# Patient Record
Sex: Female | Born: 1985 | Race: Black or African American | Hispanic: No | Marital: Single | Smoking: Former smoker
Health system: Southern US, Community
[De-identification: ages and names within clinical notes are randomized; demographics above are authoritative.]

## PROBLEM LIST (undated history)

## (undated) ENCOUNTER — Inpatient Hospital Stay (HOSPITAL_COMMUNITY): Payer: Self-pay

## (undated) DIAGNOSIS — F32A Depression, unspecified: Secondary | ICD-10-CM

## (undated) DIAGNOSIS — I429 Cardiomyopathy, unspecified: Secondary | ICD-10-CM

## (undated) DIAGNOSIS — B999 Unspecified infectious disease: Secondary | ICD-10-CM

## (undated) DIAGNOSIS — Z8669 Personal history of other diseases of the nervous system and sense organs: Secondary | ICD-10-CM

## (undated) DIAGNOSIS — R51 Headache: Secondary | ICD-10-CM

## (undated) DIAGNOSIS — G479 Sleep disorder, unspecified: Secondary | ICD-10-CM

## (undated) DIAGNOSIS — R600 Localized edema: Secondary | ICD-10-CM

## (undated) DIAGNOSIS — Z8619 Personal history of other infectious and parasitic diseases: Secondary | ICD-10-CM

## (undated) DIAGNOSIS — R079 Chest pain, unspecified: Secondary | ICD-10-CM

## (undated) DIAGNOSIS — R002 Palpitations: Secondary | ICD-10-CM

## (undated) DIAGNOSIS — R0602 Shortness of breath: Secondary | ICD-10-CM

## (undated) DIAGNOSIS — F319 Bipolar disorder, unspecified: Secondary | ICD-10-CM

## (undated) DIAGNOSIS — T7840XA Allergy, unspecified, initial encounter: Secondary | ICD-10-CM

## (undated) DIAGNOSIS — M549 Dorsalgia, unspecified: Secondary | ICD-10-CM

## (undated) DIAGNOSIS — E785 Hyperlipidemia, unspecified: Secondary | ICD-10-CM

## (undated) DIAGNOSIS — R5383 Other fatigue: Secondary | ICD-10-CM

## (undated) DIAGNOSIS — G4730 Sleep apnea, unspecified: Secondary | ICD-10-CM

## (undated) DIAGNOSIS — I10 Essential (primary) hypertension: Secondary | ICD-10-CM

## (undated) HISTORY — DX: Sleep apnea, unspecified: G47.30

## (undated) HISTORY — DX: Personal history of other diseases of the nervous system and sense organs: Z86.69

## (undated) HISTORY — DX: Dorsalgia, unspecified: M54.9

## (undated) HISTORY — DX: Allergy, unspecified, initial encounter: T78.40XA

## (undated) HISTORY — DX: Palpitations: R00.2

## (undated) HISTORY — PX: ADENOIDECTOMY: SUR15

## (undated) HISTORY — DX: Hyperlipidemia, unspecified: E78.5

## (undated) HISTORY — DX: Chest pain, unspecified: R07.9

## (undated) HISTORY — DX: Personal history of other infectious and parasitic diseases: Z86.19

## (undated) HISTORY — DX: Other fatigue: R53.83

## (undated) HISTORY — DX: Cardiomyopathy, unspecified: I42.9

## (undated) HISTORY — DX: Localized edema: R60.0

## (undated) HISTORY — DX: Bipolar disorder, unspecified: F31.9

## (undated) HISTORY — DX: Sleep disorder, unspecified: G47.9

## (undated) HISTORY — PX: DILATION AND CURETTAGE OF UTERUS: SHX78

## (undated) HISTORY — DX: Shortness of breath: R06.02

## (undated) HISTORY — DX: Depression, unspecified: F32.A

---
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
## 2009-03-18 ENCOUNTER — Inpatient Hospital Stay (HOSPITAL_COMMUNITY): Admission: AD | Admit: 2009-03-18 | Discharge: 2009-03-18 | Payer: Self-pay | Admitting: Obstetrics & Gynecology

## 2009-06-19 ENCOUNTER — Emergency Department (HOSPITAL_COMMUNITY): Admission: EM | Admit: 2009-06-19 | Discharge: 2009-06-19 | Payer: Self-pay | Admitting: Emergency Medicine

## 2009-06-20 ENCOUNTER — Inpatient Hospital Stay (HOSPITAL_COMMUNITY): Admission: AD | Admit: 2009-06-20 | Discharge: 2009-06-20 | Payer: Self-pay | Admitting: Obstetrics & Gynecology

## 2009-07-27 ENCOUNTER — Emergency Department (HOSPITAL_COMMUNITY): Admission: EM | Admit: 2009-07-27 | Discharge: 2009-07-27 | Payer: Self-pay | Admitting: Emergency Medicine

## 2009-07-28 ENCOUNTER — Emergency Department (HOSPITAL_COMMUNITY): Admission: EM | Admit: 2009-07-28 | Discharge: 2009-07-29 | Payer: Self-pay | Admitting: Emergency Medicine

## 2009-12-19 ENCOUNTER — Inpatient Hospital Stay (HOSPITAL_COMMUNITY): Admission: AD | Admit: 2009-12-19 | Discharge: 2009-12-20 | Payer: Self-pay | Admitting: Obstetrics & Gynecology

## 2009-12-19 ENCOUNTER — Ambulatory Visit: Payer: Self-pay | Admitting: Physician Assistant

## 2010-01-14 DEATH — deceased

## 2010-07-23 ENCOUNTER — Emergency Department (HOSPITAL_COMMUNITY): Admission: EM | Admit: 2010-07-23 | Discharge: 2009-10-09 | Payer: Self-pay | Admitting: Emergency Medicine

## 2010-07-23 ENCOUNTER — Inpatient Hospital Stay (HOSPITAL_COMMUNITY): Admission: AD | Admit: 2010-07-23 | Discharge: 2009-12-18 | Payer: Self-pay | Admitting: Obstetrics and Gynecology

## 2010-09-25 ENCOUNTER — Emergency Department (HOSPITAL_COMMUNITY)
Admission: EM | Admit: 2010-09-25 | Discharge: 2010-09-25 | Disposition: A | Discharge status: Home / Self Care | Payer: 59 | Attending: Emergency Medicine | Admitting: Emergency Medicine

## 2010-09-25 ENCOUNTER — Emergency Department (HOSPITAL_COMMUNITY): Payer: 59

## 2010-09-25 DIAGNOSIS — R0602 Shortness of breath: Secondary | ICD-10-CM | POA: Insufficient documentation

## 2010-09-25 DIAGNOSIS — F17200 Nicotine dependence, unspecified, uncomplicated: Secondary | ICD-10-CM | POA: Insufficient documentation

## 2010-09-25 DIAGNOSIS — R079 Chest pain, unspecified: Secondary | ICD-10-CM | POA: Insufficient documentation

## 2010-11-03 LAB — URINALYSIS, ROUTINE W REFLEX MICROSCOPIC
Glucose, UA: NEGATIVE mg/dL
Leukocytes, UA: NEGATIVE
Nitrite: NEGATIVE
Protein, ur: NEGATIVE mg/dL

## 2010-11-03 LAB — WET PREP, GENITAL: Trich, Wet Prep: NONE SEEN

## 2010-11-03 LAB — CBC
MCV: 95.3 fL (ref 78.0–100.0)
RDW: 12.4 % (ref 11.5–15.5)

## 2010-11-03 LAB — GC/CHLAMYDIA PROBE AMP, GENITAL
Chlamydia, DNA Probe: NEGATIVE
GC Probe Amp, Genital: NEGATIVE

## 2010-11-03 LAB — URINE MICROSCOPIC-ADD ON

## 2010-11-03 LAB — POCT PREGNANCY, URINE: Preg Test, Ur: POSITIVE

## 2010-11-17 LAB — URINALYSIS, ROUTINE W REFLEX MICROSCOPIC
Bilirubin Urine: NEGATIVE
Hgb urine dipstick: NEGATIVE
Nitrite: NEGATIVE
Protein, ur: NEGATIVE mg/dL
Urobilinogen, UA: 1 mg/dL (ref 0.0–1.0)
pH: 6 (ref 5.0–8.0)

## 2010-11-17 LAB — WET PREP, GENITAL: Yeast Wet Prep HPF POC: NONE SEEN

## 2010-11-17 LAB — COMPREHENSIVE METABOLIC PANEL
Albumin: 3.8 g/dL (ref 3.5–5.2)
Alkaline Phosphatase: 50 U/L (ref 39–117)
BUN: 10 mg/dL (ref 6–23)
Calcium: 9.5 mg/dL (ref 8.4–10.5)
Creatinine, Ser: 0.87 mg/dL (ref 0.4–1.2)
GFR calc Af Amer: >60 mL/min (ref >60)
Glucose, Bld: 107 mg/dL — ABNORMAL HIGH (ref 70–99)
Total Protein: 7.8 g/dL (ref 6.0–8.3)

## 2010-11-17 LAB — POCT PREGNANCY, URINE: Preg Test, Ur: NEGATIVE

## 2010-11-17 LAB — CBC
HCT: 39 % (ref 36.0–46.0)
MCHC: 33.9 g/dL (ref 30.0–36.0)
Platelets: 166 10*3/uL (ref 150–400)
RBC: 4.14 MIL/uL (ref 3.87–5.11)
RDW: 12.1 % (ref 11.5–15.5)
WBC: 6.7 10*3/uL (ref 4.0–10.5)

## 2010-11-17 LAB — DIFFERENTIAL
Eosinophils Relative: 0 % (ref 0–5)
Neutro Abs: 5.5 10*3/uL (ref 1.7–7.7)
Neutrophils Relative %: 82 % — ABNORMAL HIGH (ref 43–77)

## 2010-11-17 LAB — URINE MICROSCOPIC-ADD ON

## 2010-11-18 LAB — URINALYSIS, ROUTINE W REFLEX MICROSCOPIC
Bilirubin Urine: NEGATIVE
Glucose, UA: NEGATIVE mg/dL
Ketones, ur: NEGATIVE mg/dL
Nitrite: NEGATIVE
Protein, ur: NEGATIVE mg/dL
Specific Gravity, Urine: 1.02 (ref 1.005–1.030)
Urobilinogen, UA: 0.2 mg/dL (ref 0.0–1.0)
pH: 7 (ref 5.0–8.0)

## 2010-11-18 LAB — RAPID STREP SCREEN (MED CTR MEBANE ONLY): Streptococcus, Group A Screen (Direct): NEGATIVE

## 2010-11-18 LAB — POCT PREGNANCY, URINE: Preg Test, Ur: NEGATIVE

## 2010-11-21 LAB — URINALYSIS, ROUTINE W REFLEX MICROSCOPIC
Bilirubin Urine: NEGATIVE
Glucose, UA: NEGATIVE mg/dL
Ketones, ur: NEGATIVE mg/dL
Protein, ur: NEGATIVE mg/dL

## 2010-11-21 LAB — WET PREP, GENITAL
Trich, Wet Prep: NONE SEEN
Yeast Wet Prep HPF POC: NONE SEEN

## 2010-11-21 LAB — GC/CHLAMYDIA PROBE AMP, GENITAL: Chlamydia, DNA Probe: POSITIVE — AB

## 2010-11-21 LAB — CBC
HCT: 36.6 % (ref 36.0–46.0)
Platelets: 157 10*3/uL (ref 150–400)
RDW: 12.3 % (ref 11.5–15.5)
WBC: 4.9 10*3/uL (ref 4.0–10.5)

## 2011-03-19 DIAGNOSIS — N970 Female infertility associated with anovulation: Secondary | ICD-10-CM | POA: Insufficient documentation

## 2012-02-14 LAB — HM PAP SMEAR: HM Pap smear: NORMAL

## 2012-07-04 ENCOUNTER — Encounter (HOSPITAL_COMMUNITY): Payer: Self-pay | Admitting: Emergency Medicine

## 2012-07-04 ENCOUNTER — Emergency Department (HOSPITAL_COMMUNITY)
Admission: EM | Admit: 2012-07-04 | Discharge: 2012-07-05 | Disposition: A | Discharge status: Home / Self Care | Payer: 59 | Attending: Emergency Medicine | Admitting: Emergency Medicine

## 2012-07-04 DIAGNOSIS — L02419 Cutaneous abscess of limb, unspecified: Secondary | ICD-10-CM | POA: Insufficient documentation

## 2012-07-04 DIAGNOSIS — W57XXXA Bitten or stung by nonvenomous insect and other nonvenomous arthropods, initial encounter: Secondary | ICD-10-CM | POA: Insufficient documentation

## 2012-07-04 DIAGNOSIS — S90569A Insect bite (nonvenomous), unspecified ankle, initial encounter: Secondary | ICD-10-CM | POA: Insufficient documentation

## 2012-07-04 DIAGNOSIS — Y929 Unspecified place or not applicable: Secondary | ICD-10-CM | POA: Insufficient documentation

## 2012-07-04 DIAGNOSIS — Y939 Activity, unspecified: Secondary | ICD-10-CM | POA: Insufficient documentation

## 2012-07-04 DIAGNOSIS — R21 Rash and other nonspecific skin eruption: Secondary | ICD-10-CM | POA: Insufficient documentation

## 2012-07-04 DIAGNOSIS — L0390 Cellulitis, unspecified: Secondary | ICD-10-CM

## 2012-07-04 NOTE — ED Notes (Signed)
Pt reports bite to R lateral thigh around sun/mon; reports red and swollen now; denies fevers

## 2012-07-05 MED ORDER — CEPHALEXIN 500 MG PO CAPS: 500.0000 mg | ORAL_CAPSULE | Freq: Four times a day (QID) | ORAL | Status: DC

## 2012-07-05 NOTE — ED Notes (Signed)
Family at bedside. 

## 2012-07-05 NOTE — ED Notes (Signed)
MD at bedside. 

## 2012-07-05 NOTE — ED Notes (Signed)
I gave the patient a pair of tan large socks. 

## 2012-07-05 NOTE — ED Notes (Signed)
Patient says she does not what bit her, but she has a bite on her right, thigh bellow right buttocks.  She also has another bite behind the left calf.  Patient says the one on the left calf happened first, and the one on the right thigh is worse.

## 2012-07-05 NOTE — ED Provider Notes (Signed)
History     CSN: [id number]  Arrival date & time 07/04/12  2346   First MD Initiated Contact with Patient 07/04/12 2351      Chief Complaint  Patient presents with  . Insect Bite   HPI  History provided by the patient. Patient is a 26 year old female with no significant PMH who presents with concerns for insect bites to lateral right thigh left ankle and calf. Patient first noticed a small little swollen areas of the skin with itching 3-5 days ago. Patient does admit to scratching and itching to the areas regularly. She has used hydrocortisone creams over the area but states that since that time the areas have become swollen and firm increased redness. Patient denies having any erythematous streaks up the leg. Areas continued to have some itching. She denies any significant pain or tenderness. She denies any nodules or fluctuance. She denies having similar symptoms previously. Patient has no other complaints. Denies any fever, chills or sweats.    History reviewed. No pertinent past medical history.  Past Surgical History  Procedure Date  . Adenoidectomy     History reviewed. No pertinent family history.  History  Substance Use Topics  . Smoking status: Never Smoker   . Smokeless tobacco: Not on file  . Alcohol Use: No    OB History    Grav Para Term Preterm Abortions TAB SAB Ect Mult Living                  Review of Systems  Constitutional: Negative for fever, chills and diaphoresis.  Respiratory: Negative for cough and shortness of breath.   Cardiovascular: Negative for chest pain.  Gastrointestinal: Negative for abdominal pain.  Skin: Positive for rash.    Allergies  Review of patient's allergies indicates no known allergies.  Home Medications   Current Outpatient Rx  Name  Route  Sig  Dispense  Refill  . HYDROCORTISONE 1 % EX CREA   Topical   Apply 1 application topically as needed. itching           BP 165/94  Pulse 85  Temp 98.6 F (37 C)  (Oral)  Resp 18  SpO2 99%  LMP 06/13/2012  Physical Exam  Nursing note and vitals reviewed. Constitutional: She is oriented to person, place, and time. She appears well-developed and well-nourished. No distress.  HENT:  Head: Normocephalic.  Cardiovascular: Normal rate and regular rhythm.   No murmur heard. Pulmonary/Chest: Effort normal and breath sounds normal. No respiratory distress. She has no wheezes. She has no rales.  Neurological: She is alert and oriented to person, place, and time.  Skin: Skin is warm and dry.       There is a 12-13 cm circular area of erythema and induration to the lateral right thigh. There is no significant tenderness. No nodularity or fluctuance. No bleeding or drainage. There is significant increased warmth of the skin over the area. No erythematous streaks.  Similar 4 cm circular area to the posterior left lower leg with erythema and induration. No bleeding or drainage. No erythematous streaks.  Psychiatric: She has a normal mood and affect. Her behavior is normal.    ED Course  Procedures      1. Cellulitis       MDM  12:35 AM patient seen and evaluated. Patient appears well in no acute distress. Patient nontoxic appearing.        Angus Seller, [place] 07/05/12 [phone]

## 2012-07-05 NOTE — ED Notes (Signed)
Patient is alert and orientedx4.  Patient was explained discharge instructions and she did not have any questions. 

## 2012-07-05 NOTE — ED Provider Notes (Signed)
Medical screening examination/treatment/procedure(s) were performed by non-physician practitioner and as supervising physician I was immediately available for consultation/collaboration.  Tenicia Gural K Jonanthan Bolender, MD 07/05/12 0405 

## 2012-08-08 ENCOUNTER — Other Ambulatory Visit (INDEPENDENT_AMBULATORY_CARE_PROVIDER_SITE_OTHER): Payer: 59

## 2012-08-08 ENCOUNTER — Telehealth: Payer: Self-pay | Admitting: *Deleted

## 2012-08-08 ENCOUNTER — Encounter: Payer: Self-pay | Admitting: Internal Medicine

## 2012-08-08 ENCOUNTER — Encounter: Payer: Self-pay | Admitting: *Deleted

## 2012-08-08 ENCOUNTER — Ambulatory Visit (INDEPENDENT_AMBULATORY_CARE_PROVIDER_SITE_OTHER): Payer: 59 | Admitting: Internal Medicine

## 2012-08-08 VITALS — BP 112/72 | HR 79 | Temp 97.9°F | Ht 65.0 in | Wt 243.2 lb

## 2012-08-08 DIAGNOSIS — Z131 Encounter for screening for diabetes mellitus: Secondary | ICD-10-CM

## 2012-08-08 DIAGNOSIS — Z130 Encounter for screening for diseases of the blood and blood-forming organs and certain disorders involving the immune mechanism: Secondary | ICD-10-CM

## 2012-08-08 DIAGNOSIS — Z0000 Encounter for general adult medical examination without abnormal findings: Secondary | ICD-10-CM

## 2012-08-08 DIAGNOSIS — G43909 Migraine, unspecified, not intractable, without status migrainosus: Secondary | ICD-10-CM

## 2012-08-08 DIAGNOSIS — Z1329 Encounter for screening for other suspected endocrine disorder: Secondary | ICD-10-CM

## 2012-08-08 DIAGNOSIS — Z13220 Encounter for screening for lipoid disorders: Secondary | ICD-10-CM

## 2012-08-08 LAB — LIPID PANEL
HDL: 34.9 mg/dL — ABNORMAL LOW (ref >39.00)
LDL Cholesterol: 82 mg/dL (ref 0–99)
VLDL: 39.6 mg/dL (ref 0.0–40.0)

## 2012-08-08 LAB — CBC
Platelets: 196 10*3/uL (ref 150.0–400.0)
RBC: 3.99 Mil/uL (ref 3.87–5.11)
WBC: 5.1 10*3/uL (ref 4.5–10.5)

## 2012-08-08 LAB — BASIC METABOLIC PANEL
BUN: 7 mg/dL (ref 6–23)
CO2: 25 mEq/L (ref 19–32)
Chloride: 105 mEq/L (ref 96–112)
GFR: 108.63 mL/min (ref >60.00)
Glucose, Bld: 98 mg/dL (ref 70–99)
Potassium: 4.5 mEq/L (ref 3.5–5.1)
Sodium: 138 mEq/L (ref 135–145)

## 2012-08-08 LAB — HEMOGLOBIN A1C: Hgb A1c MFr Bld: 5.1 % (ref 4.6–6.5)

## 2012-08-08 LAB — TSH: TSH: 0.77 u[IU]/mL (ref 0.35–5.50)

## 2012-08-08 MED ORDER — TOPIRAMATE 100 MG PO TABS: 100.0000 mg | ORAL_TABLET | Freq: Two times a day (BID) | ORAL | Status: DC

## 2012-08-08 MED ORDER — BUTALBITAL-APAP-CAFFEINE 50-325-40 MG PO TABS: 1.0000 | ORAL_TABLET | Freq: Four times a day (QID) | ORAL | Status: DC | PRN

## 2012-08-08 NOTE — Progress Notes (Signed)
HPI  Pt presents to the clinic today to establish care. She has not seen a PCP in over a year. She does have concerns about her blood pressure. Everyone in her family has HTN and she is concerned that she may have it also. She also has concerns about her migraines. She has a history of migraines since being in the 4th grade. She did use to be on Topamax but ran out and has not had it refilled. She is getting migraines twice per month. She does have associated nausea and vomiting, but no aura.  Past Medical History  Diagnosis Date  . Hx of migraines   . Hyperlipidemia   . Allergy   . History of chicken pox     No current outpatient prescriptions on file.    No Known Allergies  Family History  Problem Relation Age of Onset  . Hypertension Mother   . Hypertension Father   . Hypertension Maternal Grandmother   . Hypertension Maternal Grandfather   . Hypertension Paternal Grandmother   . Hypertension Paternal Grandfather     History   Social History  . Marital Status: Single    Spouse Name: N/A    Number of Children: 0  . Years of Education: 12+   Occupational History  .  [employer]   Social History Main Topics  . Smoking status: Current Every Day Smoker  . Smokeless tobacco: Never Used  . Alcohol Use: 0.6 oz/week    1 Cans of beer per week  . Drug Use: No  . Sexually Active: Not on file   Other Topics Concern  . Not on file   Social History Narrative   Regular exercise-noCaffeine Use-yes    ROS:  Constitutional: Pt reports headache. Denies fever, malaise, fatigue or abrupt weight changes.  HEENT: Denies eye pain, eye redness, ear pain, ringing in the ears, wax buildup, runny nose, nasal congestion, bloody nose, or sore throat. Respiratory: Denies difficulty breathing, shortness of breath, cough or sputum production.   Cardiovascular: Denies chest pain, chest tightness, palpitations or swelling in the hands or feet.  Gastrointestinal: Denies abdominal  pain, bloating, constipation, diarrhea or blood in the stool.  GU: Denies frequency, urgency, pain with urination, blood in urine, odor or discharge. Musculoskeletal: Denies decrease in range of motion, difficulty with gait, muscle pain or joint pain and swelling.  Skin: Denies redness, rashes, lesions or ulcercations.  Neurological: Denies dizziness, difficulty with memory, difficulty with speech or problems with balance and coordination.   No other specific complaints in a complete review of systems (except as listed in HPI above).  PE:  BP 112/72  Pulse 79  Temp 97.9 F (36.6 C) (Oral)  Ht 5\' 5"  (1.651 m)  Wt 243 lb 4 oz (110.337 kg)  BMI 40.48 kg/m2  SpO2 98%  LMP 08/05/2012 Wt Readings from Last 3 Encounters:  08/08/12 243 lb 4 oz (110.337 kg)    General: Appears her stated age,obese but well developed, well nourished in NAD. HEENT: Head: normal shape and size; Eyes: sclera white, no icterus, conjunctiva pink, PERRLA and EOMs intact; Ears: Tm's gray and intact, normal light reflex; Nose: mucosa pink and moist, septum midline; Throat/Mouth: Teeth present, mucosa pink and moist, no lesions or ulcerations noted.  Neck: Normal range of motion. Neck supple, trachea midline. No massses, lumps or thyromegaly present.  Cardiovascular: Normal rate and rhythm. S1,S2 noted.  No murmur, rubs or gallops noted. No JVD or BLE edema. No carotid bruits noted. Pulmonary/Chest: Normal  effort and positive vesicular breath sounds. No respiratory distress. No wheezes, rales or ronchi noted.  Abdomen: Soft and nontender. Normal bowel sounds, no bruits noted. No distention or masses noted. Liver, spleen and kidneys non palpable. Musculoskeletal: Normal range of motion. No signs of joint swelling. No difficulty with gait.  Neurological: Alert and oriented. Cranial nerves II-XII intact. Coordination normal. +DTRs bilaterally. Psychiatric: Mood and affect normal. Behavior is normal. Judgment and thought  content normal.    Assessment and Plan:  Preventative Health Maintenance:  Start a diet and exercise routine Avoid salt in your diet All HM UTD Will obtain basic screening labs today  Migraines, without aura  Refill Topamax Fiorecet for breakthrough  RTC in 1 year or sooner if needed

## 2012-08-08 NOTE — Patient Instructions (Signed)
Health Maintenance, Females A healthy lifestyle and preventative care can promote health and wellness.  Maintain regular health, dental, and eye exams.   Eat a healthy diet. Foods like vegetables, fruits, whole grains, low-fat dairy products, and lean protein foods contain the nutrients you need without too many calories. Decrease your intake of foods high in solid fats, added sugars, and salt. Get information about a proper diet from your caregiver, if necessary.   Regular physical exercise is one of the most important things you can do for your health. Most adults should get at least 150 minutes of moderate-intensity exercise (any activity that increases your heart rate and causes you to sweat) each week. In addition, most adults need muscle-strengthening exercises on 2 or more days a week.     Maintain a healthy weight. The body mass index (BMI) is a screening tool to identify possible weight problems. It provides an estimate of body fat based on height and weight. Your caregiver can help determine your BMI, and can help you achieve or maintain a healthy weight. For adults 20 years and older:   A BMI below 18.5 is considered underweight.   A BMI of 18.5 to 24.9 is normal.   A BMI of 25 to 29.9 is considered overweight.   A BMI of 30 and above is considered obese.   Maintain normal blood lipids and cholesterol by exercising and minimizing your intake of saturated fat. Eat a balanced diet with plenty of fruits and vegetables. Blood tests for lipids and cholesterol should begin at age 74 and be repeated every 5 years. If your lipid or cholesterol levels are high, you are over 50, or you are a high risk for heart disease, you may need your cholesterol levels checked more frequently. Ongoing high lipid and cholesterol levels should be treated with medicines if diet and exercise are not effective.   If you smoke, find out from your caregiver how to quit. If you do not use tobacco, do not start.    If you are pregnant, do not drink alcohol. If you are breastfeeding, be very cautious about drinking alcohol. If you are not pregnant and choose to drink alcohol, do not exceed 1 drink per day. One drink is considered to be 12 ounces (355 mL) of beer, 5 ounces (148 mL) of wine, or 1.5 ounces (44 mL) of liquor.   Avoid use of street drugs. Do not share needles with anyone. Ask for help if you need support or instructions about stopping the use of drugs.   High blood pressure causes heart disease and increases the risk of stroke. Blood pressure should be checked at least every 1 to 2 years. Ongoing high blood pressure should be treated with medicines, if weight loss and exercise are not effective.   If you are 54 to 24 years old, ask your caregiver if you should take aspirin to prevent strokes.   Diabetes screening involves taking a blood sample to check your fasting blood sugar level. This should be done once every 3 years, after age 17, if you are within normal weight and without risk factors for diabetes. Testing should be considered at a younger age or be carried out more frequently if you are overweight and have at least 1 risk factor for diabetes.   Breast cancer screening is essential preventative care for women. You should practice "breast self-awareness." This means understanding the normal appearance and feel of your breasts and may include breast self-examination. Any changes detected, no  matter how small, should be reported to a caregiver. Women in their 82s and 30s should have a clinical breast exam (CBE) by a caregiver as part of a regular health exam every 1 to 3 years. After age 60, women should have a CBE every year. Starting at age 45, women should consider having a mammogram (breast X-ray) every year. Women who have a family history of breast cancer should talk to their caregiver about genetic screening. Women at a high risk of breast cancer should talk to their caregiver about having  an MRI and a mammogram every year.   The Pap test is a screening test for cervical cancer. Women should have a Pap test starting at age 71. Between ages 52 and 13, Pap tests should be repeated every 2 years. Beginning at age 70, you should have a Pap test every 3 years as long as the past 3 Pap tests have been normal. If you had a hysterectomy for a problem that was not cancer or a condition that could lead to cancer, then you no longer need Pap tests. If you are between ages 45 and 66, and you have had normal Pap tests going back 10 years, you no longer need Pap tests. If you have had past treatment for cervical cancer or a condition that could lead to cancer, you need Pap tests and screening for cancer for at least 20 years after your treatment. If Pap tests have been discontinued, risk factors (such as a new sexual partner) need to be reassessed to determine if screening should be resumed. Some women have medical problems that increase the chance of getting cervical cancer. In these cases, your caregiver may recommend more frequent screening and Pap tests.   The human papillomavirus (HPV) test is an additional test that may be used for cervical cancer screening. The HPV test looks for the virus that can cause the cell changes on the cervix. The cells collected during the Pap test can be tested for HPV. The HPV test could be used to screen women aged 58 years and older, and should be used in women of any age who have unclear Pap test results. After the age of 51, women should have HPV testing at the same frequency as a Pap test.   Colorectal cancer can be detected and often prevented. Most routine colorectal cancer screening begins at the age of 75 and continues through age 69. However, your caregiver may recommend screening at an earlier age if you have risk factors for colon cancer. On a yearly basis, your caregiver may provide home test kits to check for hidden blood in the stool. Use of a small camera at  the end of a tube, to directly examine the colon (sigmoidoscopy or colonoscopy), can detect the earliest forms of colorectal cancer. Talk to your caregiver about this at age 12, when routine screening begins. Direct examination of the colon should be repeated every 5 to 10 years through age 22, unless early forms of pre-cancerous polyps or small growths are found.   Hepatitis C blood testing is recommended for all people born from [birth year] through 1963 and any individual with known risks for hepatitis C.   Practice safe sex. Use condoms and avoid high-risk sexual practices to reduce the spread of sexually transmitted infections (STIs). Sexually active women aged 30 and younger should be checked for Chlamydia, which is a common sexually transmitted infection. Older women with new or multiple partners should also be tested for Chlamydia. Testing  for other STIs is recommended if you are sexually active and at increased risk.   Osteoporosis is a disease in which the bones lose minerals and strength with aging. This can result in serious bone fractures. The risk of osteoporosis can be identified using a bone density scan. Women ages 36 and over and women at risk for fractures or osteoporosis should discuss screening with their caregivers. Ask your caregiver whether you should be taking a calcium supplement or vitamin D to reduce the rate of osteoporosis.   Menopause can be associated with physical symptoms and risks. Hormone replacement therapy is available to decrease symptoms and risks. You should talk to your caregiver about whether hormone replacement therapy is right for you.   Use sunscreen with a sun protection factor (SPF) of 30 or greater. Apply sunscreen liberally and repeatedly throughout the day. You should seek shade when your shadow is shorter than you. Protect yourself by wearing long sleeves, pants, a wide-brimmed hat, and sunglasses year round, whenever you are outdoors.   Notify your caregiver  of new moles or changes in moles, especially if there is a change in shape or color. Also notify your caregiver if a mole is larger than the size of a pencil eraser.   Stay current with your immunizations.  Document Released: 02/15/2011 Document Revised: 10/25/2011 Document Reviewed: 02/15/2011 [facility] Patient Information 2013 [place], [place].   Hypertension As your heart beats, it forces blood through your arteries. This force is your blood pressure. If the pressure is too high, it is called hypertension (HTN) or high blood pressure. HTN is dangerous because you may have it and not know it. High blood pressure may mean that your heart has to work harder to pump blood. Your arteries may be narrow or stiff. The extra work puts you at risk for heart disease, stroke, and other problems.   Blood pressure consists of two numbers, a higher number over a lower, 110/72, for example. It is stated as "110 over 72." The ideal is below 120 for the top number (systolic) and under 80 for the bottom (diastolic). Write down your blood pressure today. You should pay close attention to your blood pressure if you have certain conditions such as:  Heart failure.   Prior heart attack.   Diabetes   Chronic kidney disease.   Prior stroke.   Multiple risk factors for heart disease.  To see if you have HTN, your blood pressure should be measured while you are seated with your arm held at the level of the heart. It should be measured at least twice. A one-time elevated blood pressure reading (especially in the Emergency Department) does not mean that you need treatment. There may be conditions in which the blood pressure is different between your right and left arms. It is important to see your caregiver soon for a recheck. Most people have essential hypertension which means that there is not a specific cause. This type of high blood pressure may be lowered by changing lifestyle factors such as:  Stress.   Smoking.    Lack of exercise.   Excessive weight.   Drug/tobacco/alcohol use.   Eating less salt.  Most people do not have symptoms from high blood pressure until it has caused damage to the body. Effective treatment can often prevent, delay or reduce that damage. TREATMENT   When a cause has been identified, treatment for high blood pressure is directed at the cause. There are a large number of medications to treat  HTN. These fall into several categories, and your caregiver will help you select the medicines that are best for you. Medications may have side effects. You should review side effects with your caregiver. If your blood pressure stays high after you have made lifestyle changes or started on medicines,    Your medication(s) may need to be changed.   Other problems may need to be addressed.   Be certain you understand your prescriptions, and know how and when to take your medicine.   Be sure to follow up with your caregiver within the time frame advised (usually within two weeks) to have your blood pressure rechecked and to review your medications.   If you are taking more than one medicine to lower your blood pressure, make sure you know how and at what times they should be taken. Taking two medicines at the same time can result in blood pressure that is too low.  SEEK IMMEDIATE MEDICAL CARE IF:  You develop a severe headache, blurred or changing vision, or confusion.   You have unusual weakness or numbness, or a faint feeling.   You have severe chest or abdominal pain, vomiting, or breathing problems.  MAKE SURE YOU:    Understand these instructions.   Will watch your condition.   Will get help right away if you are not doing well or get worse.  Document Released: 08/02/2005 Document Revised: 10/25/2011 Document Reviewed: 03/22/2008 [facility] Patient Information 2013 [place], [place].

## 2012-08-08 NOTE — Telephone Encounter (Signed)
Pt informed of result via VM and to callback office with any questions/concerns.       Tracy Grant,  Can you please send this and call Tracy Grant and let her know that her triglycerides were high. She should take OTC fish oil daily. We will recheck this in 6 months. All of her other labs were normal.  Rene Kocher

## 2012-08-16 DIAGNOSIS — F319 Bipolar disorder, unspecified: Secondary | ICD-10-CM

## 2012-08-16 HISTORY — DX: Bipolar disorder, unspecified: F31.9

## 2012-08-29 ENCOUNTER — Ambulatory Visit: Payer: 59 | Admitting: Internal Medicine

## 2012-08-29 DIAGNOSIS — Z0289 Encounter for other administrative examinations: Secondary | ICD-10-CM

## 2012-09-01 ENCOUNTER — Ambulatory Visit (INDEPENDENT_AMBULATORY_CARE_PROVIDER_SITE_OTHER): Payer: 59 | Admitting: Internal Medicine

## 2012-09-01 ENCOUNTER — Encounter: Payer: Self-pay | Admitting: Internal Medicine

## 2012-09-01 VITALS — BP 112/70 | HR 90 | Temp 97.9°F | Ht 65.0 in | Wt 237.4 lb

## 2012-09-01 DIAGNOSIS — G43909 Migraine, unspecified, not intractable, without status migrainosus: Secondary | ICD-10-CM

## 2012-09-01 MED ORDER — SUMATRIPTAN SUCCINATE 50 MG PO TABS: 50.0000 mg | ORAL_TABLET | ORAL | Status: DC | PRN

## 2012-09-01 NOTE — Patient Instructions (Signed)

## 2012-09-01 NOTE — Progress Notes (Signed)
Subjective:    Patient ID: Tracy Grant, female    DOB: 02-Jun-1986, 27 y.o.   MRN: [id number]  HPI  Pt presents to the clinic today with c/o headache. This started 1 day ago. The pressure is on the left side of her head. She has not had any nausea or vomiting. She does not see any flickering lights. She was seen on 08/08/2012 at which time she was placed on Topamax to prevent the headaches and Fioricet for breakthrough. In the past week, the headaches have occurred almost every day. She feels like the meds are not working. Additionally, she does feel more stressed than usual. She states that she is not depressed but does feel anxious. This started 1 year ago. She has no idea what triggers the anxiety. She is interested in a referral to psychiatry.  Review of Systems  Past Medical History  Diagnosis Date  . Hx of migraines   . Hyperlipidemia   . Allergy   . History of chicken pox     Current Outpatient Prescriptions  Medication Sig Dispense Refill  . butalbital-acetaminophen-caffeine (FIORICET) 50-325-40 MG per tablet Take 1-2 tablets by mouth every 6 (six) hours as needed for headache.  20 tablet  0  . topiramate (TOPAMAX) 100 MG tablet Take 1 tablet (100 mg total) by mouth 2 (two) times daily.  60 tablet  2    No Known Allergies  Family History  Problem Relation Age of Onset  . Hypertension Mother   . Hypertension Father   . Hypertension Maternal Grandmother   . Hypertension Maternal Grandfather   . Hypertension Paternal Grandmother   . Hypertension Paternal Grandfather     History   Social History  . Marital Status: Single    Spouse Name: N/A    Number of Children: 0  . Years of Education: 12+   Occupational History  .  [employer]   Social History Main Topics  . Smoking status: Current Every Day Smoker  . Smokeless tobacco: Never Used  . Alcohol Use: 0.6 oz/week    1 Cans of beer per week  . Drug Use: No  . Sexually Active: Not on file   Other  Topics Concern  . Not on file   Social History Narrative   Regular exercise-noCaffeine Use-yes     Constitutional: Pt reports headache. Denies fever, malaise, fatigue, or abrupt weight changes.  Respiratory: Denies difficulty breathing, shortness of breath, cough or sputum production.   Cardiovascular: Denies chest pain, chest tightness, palpitations or swelling in the hands or feet.  Neurological: Denies dizziness, difficulty with memory, difficulty with speech or problems with balance and coordination.   No other specific complaints in a complete review of systems (except as listed in HPI above).     Objective:   Physical Exam   BP 112/70  Pulse 90  Temp 97.9 F (36.6 C) (Oral)  Ht 5\' 5"  (1.651 m)  Wt 237 lb 6.4 oz (107.684 kg)  BMI 39.51 kg/m2  SpO2 95%  LMP 09/01/2012 Wt Readings from Last 3 Encounters:  09/01/12 237 lb 6.4 oz (107.684 kg)  08/08/12 243 lb 4 oz (110.337 kg)    General: Appears her stated age, well developed, well nourished in NAD.Marland Kitchen  Cardiovascular: Normal rate and rhythm. S1,S2 noted.  No murmur, rubs or gallops noted. No JVD or BLE edema. No carotid bruits noted. Pulmonary/Chest: Normal effort and positive vesicular breath sounds. No respiratory distress. No wheezes, rales or ronchi noted.  Neurological: Alert and  oriented. Cranial nerves II-XII intact. Coordination normal. +DTRs bilaterally. Psychiatric: Mood and affect normal. Behavior is normal. Judgment and thought content normal.        Assessment & Plan:   Headache, recurrent, with additional workup required:  Continue topamax D/c fioricet and try imitrex Referral to neurology placed  Anxiety, new onset with additional workup required:  Encouraged pt to go talk with Dr. Dellia Cloud at [facility] medicine   RTC as needed or if symptoms persist

## 2012-09-15 ENCOUNTER — Other Ambulatory Visit: Payer: Self-pay | Admitting: Internal Medicine

## 2012-09-20 ENCOUNTER — Other Ambulatory Visit: Payer: Self-pay | Admitting: *Deleted

## 2012-09-20 MED ORDER — TOPIRAMATE 100 MG PO TABS: 100.0000 mg | ORAL_TABLET | Freq: Two times a day (BID) | ORAL | Status: DC

## 2012-10-21 ENCOUNTER — Encounter (HOSPITAL_COMMUNITY): Payer: Self-pay | Admitting: Emergency Medicine

## 2012-10-21 ENCOUNTER — Emergency Department (HOSPITAL_COMMUNITY)
Admission: EM | Admit: 2012-10-21 | Discharge: 2012-10-21 | Disposition: A | Discharge status: Home / Self Care | Payer: Self-pay | Source: Home / Self Care

## 2012-10-21 ENCOUNTER — Other Ambulatory Visit (HOSPITAL_COMMUNITY)
Admission: RE | Admit: 2012-10-21 | Discharge: 2012-10-21 | Disposition: A | Discharge status: Home / Self Care | Payer: Self-pay | Source: Ambulatory Visit | Attending: Internal Medicine | Admitting: Internal Medicine

## 2012-10-21 DIAGNOSIS — Z113 Encounter for screening for infections with a predominantly sexual mode of transmission: Secondary | ICD-10-CM | POA: Insufficient documentation

## 2012-10-21 DIAGNOSIS — N760 Acute vaginitis: Secondary | ICD-10-CM | POA: Insufficient documentation

## 2012-10-21 DIAGNOSIS — B373 Candidiasis of vulva and vagina: Secondary | ICD-10-CM

## 2012-10-21 LAB — POCT URINALYSIS DIP (DEVICE)
Ketones, ur: NEGATIVE mg/dL
Protein, ur: NEGATIVE mg/dL
Urobilinogen, UA: 0.2 mg/dL (ref 0.0–1.0)
pH: 5.5 (ref 5.0–8.0)

## 2012-10-21 LAB — POCT PREGNANCY, URINE: Preg Test, Ur: NEGATIVE

## 2012-10-21 MED ORDER — FLUCONAZOLE 150 MG PO TABS: 150.0000 mg | ORAL_TABLET | Freq: Every day | ORAL | Status: DC

## 2012-10-21 NOTE — ED Notes (Signed)
Patient Demographics  Tracy Grant, is a 27 y.o. female  ZOX:[id number]  WJX:[id number]  DOB - 1985/12/11  Chief Complaint  Patient presents with  . Vaginal Discharge        Subjective:   [name] here with 4-5 day history of vaginal discharge and vaginal itching, discharge initially was caught it she is in appearance, now getting more liquid in consistency, no suprapubic pain, no pain during intercourse, no unprotected sex, no order to discharge. No fever chills no suprapubic pain.  Objective:    Filed Vitals:   10/21/12 1736  BP: 138/90  Pulse: 82  Temp: 98.3 F (36.8 C)  TempSrc: Oral  Resp: 18  SpO2: 100%     Exam  Awake Alert, Oriented X 3, No new F.N deficits, Normal affect [place].AT,PERRAL Supple Neck,No JVD, No cervical lymphadenopathy appriciated.  Symmetrical Chest wall movement, Good air movement bilaterally, CTAB RRR,No Gallops,Rubs or new Murmurs, No Parasternal Heave +ve B.Sounds, Abd Soft, Non tender, No organomegaly appriciated, No rebound - guarding or rigidity. No Cyanosis, Clubbing or edema, No new Rash or bruise  Pelvic exam shows copious amounts of cheesy discharge, cervix appears normal, no cervical motion tenderness, bimanual exam does not reveal any tenderness or abnormal masses.    Data Review   CBC No results found for this basename: WBC, HGB, HCT, PLT, MCV, MCH, MCHC, RDW, NEUTRABS, LYMPHSABS, MONOABS, EOSABS, BASOSABS, BANDABS, BANDSABD,  in the last 168 hours  Chemistries   No results found for this basename: NA, K, CL, CO2, GLUCOSE, BUN, CREATININE, GFRCGP, CALCIUM, MG, AST, ALT, ALKPHOS, BILITOT,  in the last 168 hours ------------------------------------------------------------------------------------------------------------------ No results found for this basename: HGBA1C,  in the last 72 hours ------------------------------------------------------------------------------------------------------------------ No results  found for this basename: CHOL, HDL, LDLCALC, TRIG, CHOLHDL, LDLDIRECT,  in the last 72 hours ------------------------------------------------------------------------------------------------------------------ No results found for this basename: TSH, T4TOTAL, FREET3, T3FREE, THYROIDAB,  in the last 72 hours ------------------------------------------------------------------------------------------------------------------ No results found for this basename: VITAMINB12, FOLATE, FERRITIN, TIBC, IRON, RETICCTPCT,  in the last 72 hours  Coagulation profile  No results found for this basename: INR, PROTIME,  in the last 168 hours   Results for NATALE, BARBA (MRN [id number]) as of 10/21/2012 17:49  Ref. Range 10/21/2012 17:38 10/21/2012 17:47  Preg Test, Ur Latest Range: NEGATIVE   NEGATIVE  Specific Gravity, Urine Latest Range: 1.005-1.030  1.020   pH Latest Range: 5.0-8.0  5.5   Glucose Latest Range: NEGATIVE mg/dL NEGATIVE   Bilirubin Urine Latest Range: NEGATIVE  NEGATIVE   Ketones, ur Latest Range: NEGATIVE mg/dL NEGATIVE   Protein Latest Range: NEGATIVE mg/dL NEGATIVE   Urobilinogen, UA Latest Range: 0.0-1.0 mg/dL 0.2   Nitrite Latest Range: NEGATIVE  NEGATIVE   Leukocytes, UA Latest Range: NEGATIVE  LARGE (A)   Hgb urine dipstick Latest Range: NEGATIVE  NEGATIVE     Prior to Admission medications   Medication Sig Start Date End Date Taking? Authorizing Provider  fluconazole (DIFLUCAN) 150 MG tablet Take 1 tablet (150 mg total) by mouth daily. 10/21/12   Leroy Sea, MD  SUMAtriptan (IMITREX) 50 MG tablet Take 1 tablet (50 mg total) by mouth every 2 (two) hours as needed for migraine. 09/01/12   Nicki Reaper, NP  topiramate (TOPAMAX) 100 MG tablet Take 1 tablet (100 mg total) by mouth 2 (two) times daily. 09/20/12   Nicki Reaper, NP     Assessment & Plan   Candida vaginitis- patient will be treated with Diflucan, cervical vaginal panel has been  sent for Candida, Trichomonas, gonorrhea,  patient to come back in 4 days for final test results. Clinically appears to be Candida infection.  UA with some leukocyte Estrace positive urine, likely due to Candida infection itself, follow urine cultures, no dysuria so we'll defer any oral antibiotics.    Follow-up Information   Follow up with this clinic In 4 days. (get test results)        Leroy Sea M.D on 10/21/2012 at 5:52 PM   Leroy Sea, MD 10/21/12 [phone]

## 2012-10-21 NOTE — ED Notes (Signed)
Pt c/o vag discharge x3 weeks Sx include: vag itching w/white discharge, foul odor Denies: f/v/n/d, dysuria, hematuria Hx of BV's   She is alert w/no signs of acute distress.

## 2012-10-23 LAB — URINE CULTURE: Colony Count: 50000

## 2012-10-25 ENCOUNTER — Encounter: Payer: 59 | Admitting: Internal Medicine

## 2012-10-25 DIAGNOSIS — Z0289 Encounter for other administrative examinations: Secondary | ICD-10-CM

## 2012-10-26 ENCOUNTER — Telehealth (HOSPITAL_COMMUNITY): Payer: Self-pay | Admitting: *Deleted

## 2012-10-26 MED ORDER — METRONIDAZOLE 500 MG PO TABS: 500.0000 mg | ORAL_TABLET | Freq: Two times a day (BID) | ORAL | Status: DC

## 2012-10-26 NOTE — ED Notes (Addendum)
3/12 Pt. called on VM for her lab results. I called pt. back.  Pt. verified x 2 and given results. (GC/Chlamydia neg., Affirm: Candida neg., Gardnerella and Trich pos., Urine culture: 50,000 colonies mult. bacterial types, none predominant.) I told pt. she was going to need a Rx. of Flagyl but we were closed and all the providers had left. I told her I would get an order when I came in to work tomorrow @ 1400.  You need to notify your partner to be treated with Flagyl, no sex until you have finished your medication and your partner has been treated and to practice safe sex. You can get HIV testing at the [facility] ST[facility] clinic, by appointment. Pt. voiced understanding. Cherly Anderson M

## 2012-10-26 NOTE — ED Notes (Signed)
Pt. called back and said she got my message. Pt. Said she did not have any questions about the instructions I gave her last night.  I told pt. to wait 30 minutes before calling the pharmacy to see if the Rx. is ready. Tracy Grant 10/26/2012

## 2012-10-26 NOTE — ED Notes (Signed)
No order from Dr. Thedore Mins.  Discussed with Langston Masker PA and she e- prescribed Flagyl to the [facility] on [address].  I called pt. and left a message that her Rx. was at her pharmacy now.   Pt. instructed to no alcohol while taking this medication.  Call and let me know you got this message. Vassie Moselle 10/26/2012

## 2013-05-06 ENCOUNTER — Encounter (HOSPITAL_COMMUNITY): Payer: Self-pay

## 2013-05-06 ENCOUNTER — Emergency Department (HOSPITAL_COMMUNITY)
Admission: EM | Admit: 2013-05-06 | Discharge: 2013-05-06 | Disposition: A | Discharge status: Home / Self Care | Payer: Commercial Managed Care - PPO | Attending: Emergency Medicine | Admitting: Emergency Medicine

## 2013-05-06 DIAGNOSIS — J3489 Other specified disorders of nose and nasal sinuses: Secondary | ICD-10-CM | POA: Insufficient documentation

## 2013-05-06 DIAGNOSIS — F17200 Nicotine dependence, unspecified, uncomplicated: Secondary | ICD-10-CM | POA: Insufficient documentation

## 2013-05-06 DIAGNOSIS — E785 Hyperlipidemia, unspecified: Secondary | ICD-10-CM | POA: Insufficient documentation

## 2013-05-06 DIAGNOSIS — J029 Acute pharyngitis, unspecified: Secondary | ICD-10-CM | POA: Insufficient documentation

## 2013-05-06 DIAGNOSIS — H6690 Otitis media, unspecified, unspecified ear: Secondary | ICD-10-CM | POA: Insufficient documentation

## 2013-05-06 DIAGNOSIS — R42 Dizziness and giddiness: Secondary | ICD-10-CM | POA: Insufficient documentation

## 2013-05-06 DIAGNOSIS — H6692 Otitis media, unspecified, left ear: Secondary | ICD-10-CM

## 2013-05-06 MED ORDER — ANTIPYRINE-BENZOCAINE 5.4-1.4 % OT SOLN: 3.0000 [drp] | OTIC | Status: DC | PRN

## 2013-05-06 MED ORDER — AMOXICILLIN 500 MG PO CAPS: 500.0000 mg | ORAL_CAPSULE | Freq: Three times a day (TID) | ORAL | Status: DC

## 2013-05-06 NOTE — ED Provider Notes (Signed)
CSN: [id number]     Arrival date & time 05/06/13  1837 History  This chart was scribed for Marlon Pel, PA, working with Leonette Most B. Bernette Mayers, MD by Blanchard Kelch, ED Scribe. This patient was seen in room WTR6/WTR6 and the patient's care was started at 7:41 PM.    Chief Complaint  Patient presents with  . URI    Patient is a 27 y.o. female presenting with URI. The history is provided by the patient. No language interpreter was used.  URI Presenting symptoms: ear pain (left side), rhinorrhea and sore throat   Associated symptoms: headaches     HPI Comments: Tracy Grant is a 27 y.o. female who presents to the Emergency Department complaining of waxing and waning, worsening left ear pain that began about six days ago. She complains of associated headaches, dizziness, rhinorrhea, and sore throat. She denies fever. She is a current everyday smoker.   Past Medical History  Diagnosis Date  . Hx of migraines   . Hyperlipidemia   . Allergy   . History of chicken pox    Past Surgical History  Procedure Laterality Date  . Adenoidectomy     Family History  Problem Relation Age of Onset  . Hypertension Mother   . Hypertension Father   . Hypertension Maternal Grandmother   . Hypertension Maternal Grandfather   . Hypertension Paternal Grandmother   . Hypertension Paternal Grandfather    History  Substance Use Topics  . Smoking status: Current Every Day Smoker  . Smokeless tobacco: Never Used  . Alcohol Use: 0.6 oz/week    1 Cans of beer per week   OB History   Grav Para Term Preterm Abortions TAB SAB Ect Mult Living                 Review of Systems  HENT: Positive for ear pain (left side), sore throat and rhinorrhea.   Neurological: Positive for dizziness and headaches.  All other systems reviewed and are negative.    Allergies  Review of patient's allergies indicates no known allergies.  Home Medications   Current Outpatient Rx  Name  Route  Sig  Dispense   Refill  . Phenylephrine-DM-GG-APAP (TYLENOL COLD/FLU SEVERE) 5-10-200-325 MG TABS   Oral   Take 2 tablets by mouth every 4 (four) hours as needed (cold symptoms).         Marland Kitchen amoxicillin (AMOXIL) 500 MG capsule   Oral   Take 1 capsule (500 mg total) by mouth 3 (three) times daily.   21 capsule   0   . antipyrine-benzocaine (AURALGAN) otic solution   Left Ear   Place 3 drops into the left ear every 2 (two) hours as needed for pain.   10 mL   0    Triage Vitals: BP 152/96  Pulse 69  Temp(Src) 98.7 F (37.1 C) (Oral)  Resp 16  SpO2 100%  LMP 03/16/2013  Physical Exam  Nursing note and vitals reviewed. Constitutional: She is oriented to person, place, and time. She appears well-developed and well-nourished. No distress.  HENT:  Head: Normocephalic and atraumatic.  Right Ear: Tympanic membrane and ear canal normal.  Left Ear: There is swelling and tenderness. Tympanic membrane is injected and bulging.  Eyes: EOM are normal.  Neck: Neck supple. No tracheal deviation present.  Cardiovascular: Normal rate and regular rhythm.   Pulmonary/Chest: Effort normal. No respiratory distress.  Musculoskeletal: Normal range of motion.  Neurological: She is alert and oriented to person, place,  and time.  Skin: Skin is warm and dry.  Psychiatric: She has a normal mood and affect. Her behavior is normal.    ED Course  Procedures (including critical care time)  DIAGNOSTIC STUDIES: Oxygen Saturation is 100% on room air, normal by my interpretation.    COORDINATION OF CARE:  7:43 PM -Infection present in left ear. Patient verbalizes understanding and agrees with treatment plan.   Labs Review Labs Reviewed - No data to display Imaging Review No results found.  MDM   1. Otitis media of left ear    amoxicillin (AMOXIL) 500 MG capsule Take 1 capsule (500 mg total) by mouth 3 (three) times daily. 21 capsule Dorthula Matas, PA-C antipyrine-benzocaine [facility]) otic solution Place 3  drops into the left ear every 2 (two) hours as needed for pain.   26 y.o.Tracy Grant's evaluation in the Emergency Department is complete. It has been determined that no acute conditions requiring further emergency intervention are present at this time. The patient/guardian have been advised of the diagnosis and plan. We have discussed signs and symptoms that warrant return to the ED, such as changes or worsening in symptoms.  Vital signs are stable at discharge. Filed Vitals:   05/06/13 1852  BP: 152/96  Pulse: 69  Temp: 98.7 F (37.1 C)  Resp: 16    Patient/guardian has voiced understanding and agreed to follow-up with the PCP or specialist.  I personally performed the services described in this documentation, which was scribed in my presence. The recorded information has been reviewed and is accurate.     Dorthula Matas, PA-C 05/06/13 1956

## 2013-05-06 NOTE — ED Notes (Signed)
She c/o cold symptoms with minimal cough, but much congestion, x 6 days.  Beginning yesterday, she experienced sore throat with left ear and facial discomfort, which persists.

## 2013-05-07 NOTE — ED Provider Notes (Signed)
Medical screening examination/treatment/procedure(s) were performed by non-physician practitioner and as supervising physician I was immediately available for consultation/collaboration.   Charles B. Bernette Mayers, MD 05/07/13 1756

## 2013-05-28 ENCOUNTER — Inpatient Hospital Stay (HOSPITAL_COMMUNITY): Payer: Medicaid Other

## 2013-05-28 ENCOUNTER — Encounter (HOSPITAL_COMMUNITY): Payer: Self-pay | Admitting: *Deleted

## 2013-05-28 ENCOUNTER — Inpatient Hospital Stay (HOSPITAL_COMMUNITY)
Admission: AD | Admit: 2013-05-28 | Discharge: 2013-05-28 | Disposition: A | Discharge status: Home / Self Care | Payer: Medicaid Other | Source: Ambulatory Visit | Attending: Obstetrics & Gynecology | Admitting: Obstetrics & Gynecology

## 2013-05-28 DIAGNOSIS — O200 Threatened abortion: Secondary | ICD-10-CM

## 2013-05-28 DIAGNOSIS — O99211 Obesity complicating pregnancy, first trimester: Secondary | ICD-10-CM

## 2013-05-28 DIAGNOSIS — O99891 Other specified diseases and conditions complicating pregnancy: Secondary | ICD-10-CM | POA: Insufficient documentation

## 2013-05-28 DIAGNOSIS — R109 Unspecified abdominal pain: Secondary | ICD-10-CM | POA: Insufficient documentation

## 2013-05-28 DIAGNOSIS — O26899 Other specified pregnancy related conditions, unspecified trimester: Secondary | ICD-10-CM

## 2013-05-28 DIAGNOSIS — O26859 Spotting complicating pregnancy, unspecified trimester: Secondary | ICD-10-CM | POA: Insufficient documentation

## 2013-05-28 HISTORY — DX: Unspecified infectious disease: B99.9

## 2013-05-28 HISTORY — DX: Headache: R51

## 2013-05-28 LAB — URINALYSIS, ROUTINE W REFLEX MICROSCOPIC
Glucose, UA: NEGATIVE mg/dL
Ketones, ur: NEGATIVE mg/dL
Leukocytes, UA: NEGATIVE
Protein, ur: NEGATIVE mg/dL
Specific Gravity, Urine: 1.015 (ref 1.005–1.030)

## 2013-05-28 LAB — CBC
MCV: 90.1 fL (ref 78.0–100.0)
Platelets: 189 10*3/uL (ref 150–400)
RBC: 4.15 MIL/uL (ref 3.87–5.11)
RDW: 12 % (ref 11.5–15.5)
WBC: 5.6 10*3/uL (ref 4.0–10.5)

## 2013-05-28 LAB — WET PREP, GENITAL: Trich, Wet Prep: NONE SEEN

## 2013-05-28 LAB — URINE MICROSCOPIC-ADD ON

## 2013-05-28 LAB — HCG, QUANTITATIVE, PREGNANCY: hCG, Beta Chain, Quant, S: 38985 m[IU]/mL — ABNORMAL HIGH (ref <5)

## 2013-05-28 LAB — OB RESULTS CONSOLE GC/CHLAMYDIA
Chlamydia: NEGATIVE
Gonorrhea: NEGATIVE

## 2013-05-28 LAB — ABO/RH: ABO/RH(D): O POS

## 2013-05-28 NOTE — MAU Note (Signed)
Unsure of LMP beg of Aug or July, 08/01 noted in chart from prev visit

## 2013-05-28 NOTE — MAU Provider Note (Signed)
History     CSN: [id number]  Arrival date and time: 05/28/13 0807   None     Chief Complaint  Patient presents with  . Abdominal Pain  . Vaginal Bleeding  . Possible Pregnancy   HPI Comments: Tracy Grant 27 y.o. [id number]  Presents to MAU with cramping and spotting in pregnancy. LMP is unsure , but will be calculated at 03/16/13 from a previous note. Hx 2 SAB's.      Patient is a 27 y.o. female presenting with abdominal pain, vaginal bleeding, and pregnancy problem.  Abdominal Pain The primary symptoms of the illness include abdominal pain.  Vaginal Bleeding Associated symptoms include abdominal pain.  Possible Pregnancy Associated symptoms include abdominal pain.      Past Medical History  Diagnosis Date  . Hx of migraines   . Hyperlipidemia   . Allergy   . History of chicken pox   . Headache(784.0)   . Infection     trich    Past Surgical History  Procedure Laterality Date  . Adenoidectomy    . Dilation and curettage of uterus      Family History  Problem Relation Age of Onset  . Hypertension Mother   . Hypertension Father   . Hypertension Maternal Grandmother   . Hypertension Maternal Grandfather   . Hypertension Paternal Grandmother   . Hypertension Paternal Grandfather     History  Substance Use Topics  . Smoking status: Former Games developer  . Smokeless tobacco: Never Used     Comment: stopped with positive preg  test  . Alcohol Use: No    Allergies: No Known Allergies  Prescriptions prior to admission  Medication Sig Dispense Refill  . Prenatal Vit-Fe Fumarate-FA (PRENATAL MULTIVITAMIN) TABS tablet Take 1 tablet by mouth daily at 12 noon.        Review of Systems  Constitutional: Negative.   HENT: Negative.   Eyes: Negative.   Respiratory: Positive for wheezing.        Recent cold  Cardiovascular: Negative.   Gastrointestinal: Positive for abdominal pain.  Genitourinary: Negative.   Musculoskeletal: Negative.   Skin: Negative.    Neurological: Negative.   Psychiatric/Behavioral: Negative.    Physical Exam   Blood pressure 150/82, pulse 82, temperature 98.9 F (37.2 C), temperature source Oral, resp. rate 18, height 5\' 5"  (1.651 m), weight 242 lb (109.77 kg), last menstrual period 03/16/2013.  Physical Exam  Constitutional: She is oriented to person, place, and time. She appears well-developed and well-nourished. No distress.  HENT:  Head: Normocephalic and atraumatic.  Eyes: Pupils are equal, round, and reactive to light.  Cardiovascular: Normal rate, regular rhythm and normal heart sounds.   Respiratory: Effort normal. She has wheezes.  Wheeze clears with cough  GI: Soft. Bowel sounds are normal. She exhibits no distension and no mass. There is no tenderness. There is no rebound and no guarding.  Genitourinary:  External: Negative Vag: creamy, pink tinged discharge Cervix: closed Biman: no uterine tenderness, approx 8 weeks though difficult due to obesity  Musculoskeletal: Normal range of motion.  Neurological: She is alert and oriented to person, place, and time.  Skin: Skin is warm and dry.  Psychiatric: She has a normal mood and affect. Her behavior is normal. Judgment and thought content normal.   Results for orders placed during the hospital encounter of 05/28/13 (from the past 24 hour(s))  URINALYSIS, ROUTINE W REFLEX MICROSCOPIC     Status: Abnormal   Collection Time    05/28/13  8:25 AM      Result Value Range   Color, Urine YELLOW  YELLOW   APPearance CLEAR  CLEAR   Specific Gravity, Urine 1.015  1.005 - 1.030   pH 5.5  5.0 - 8.0   Glucose, UA NEGATIVE  NEGATIVE mg/dL   Hgb urine dipstick MODERATE (*) NEGATIVE   Bilirubin Urine NEGATIVE  NEGATIVE   Ketones, ur NEGATIVE  NEGATIVE mg/dL   Protein, ur NEGATIVE  NEGATIVE mg/dL   Urobilinogen, UA 0.2  0.0 - 1.0 mg/dL   Nitrite NEGATIVE  NEGATIVE   Leukocytes, UA NEGATIVE  NEGATIVE  URINE MICROSCOPIC-ADD ON     Status: Abnormal   Collection  Time    05/28/13  8:25 AM      Result Value Range   Squamous Epithelial / LPF FEW (*) RARE   WBC, UA 0-2  <3 WBC/hpf   Bacteria, UA RARE  RARE  POCT PREGNANCY, URINE     Status: Abnormal   Collection Time    05/28/13  8:43 AM      Result Value Range   Preg Test, Ur POSITIVE (*) NEGATIVE  WET PREP, GENITAL     Status: Abnormal   Collection Time    05/28/13  8:47 AM      Result Value Range   Yeast Wet Prep HPF POC NONE SEEN  NONE SEEN   Trich, Wet Prep NONE SEEN  NONE SEEN   Clue Cells Wet Prep HPF POC FEW (*) NONE SEEN   WBC, Wet Prep HPF POC FEW (*) NONE SEEN  CBC     Status: None   Collection Time    05/28/13  9:30 AM      Result Value Range   WBC 5.6  4.0 - 10.5 K/uL   RBC 4.15  3.87 - 5.11 MIL/uL   Hemoglobin 13.0  12.0 - 15.0 g/dL   HCT 16.1  09.6 - 04.5 %   MCV 90.1  78.0 - 100.0 fL   MCH 31.3  26.0 - 34.0 pg   MCHC 34.8  30.0 - 36.0 g/dL   RDW 40.9  81.1 - 91.4 %   Platelets 189  150 - 400 K/uL  HCG, QUANTITATIVE, PREGNANCY     Status: Abnormal   Collection Time    05/28/13  9:30 AM      Result Value Range   hCG, Beta Chain, Sharene Butters, [place] [address] (*) <5 mIU/mL  ABO/RH     Status: None   Collection Time    05/28/13  9:30 AM      Result Value Range   ABO/RH(D) O POS     US Ob Comp Less 14 Wks  05/28/2013   CLINICAL DATA:  Cramping and spotting. Gestational age by last menstrual period 10 weeks 3 days.  EXAM: OBSTETRIC <14 WK [place] AND TRANSVAGINAL OB US  TECHNIQUE: Both transabdominal and transvaginal ultrasound examinations were performed for complete evaluation of the gestation as well as the maternal uterus, adnexal regions, and pelvic cul-de-sac. Transvaginal technique was performed to assess early pregnancy.  COMPARISON:  None.  FINDINGS: Intrauterine gestational sac: Single  Yolk sac:  Present  Embryo:  Present  Cardiac Activity: Present  Heart Rate:  134 bpm  CRL:   8  mm   6 w 6 d                  [place] EDC: 01/15/2014  Maternal uterus/adnexae: Normal bilateral ovaries.  Small subchorionic hemorrhage. No free fluid.  IMPRESSION:  1. Single live intrauterine gestation with dating by ultrasound of 6 weeks 6 days. 2. Small subchorionic hematoma.   Electronically Signed   By: Annia Belt M.D.   On: 05/28/2013 10:40   US Ob Transvaginal  05/28/2013   CLINICAL DATA:  Cramping and spotting. Gestational age by last menstrual period 10 weeks 3 days.  EXAM: OBSTETRIC <14 WK [place] AND TRANSVAGINAL OB US  TECHNIQUE: Both transabdominal and transvaginal ultrasound examinations were performed for complete evaluation of the gestation as well as the maternal uterus, adnexal regions, and pelvic cul-de-sac. Transvaginal technique was performed to assess early pregnancy.  COMPARISON:  None.  FINDINGS: Intrauterine gestational sac: Single  Yolk sac:  Present  Embryo:  Present  Cardiac Activity: Present  Heart Rate:  134 bpm  CRL:   8  mm   6 w 6 d                  [place] EDC: 01/15/2014  Maternal uterus/adnexae: Normal bilateral ovaries. Small subchorionic hemorrhage. No free fluid.  IMPRESSION: 1. Single live intrauterine gestation with dating by ultrasound of 6 weeks 6 days. 2. Small subchorionic hematoma.   Electronically Signed   By: Annia Belt M.D.   On: 05/28/2013 10:40    MAU Course  Procedures  MDM  CBC, U/S, Quant, ABORh, Wet prep, GC/ Chlamydia  Assessment and Plan   A. abdominal pain in early pregnancy  P: Above orders Pelvic rest Start prenatal care Start PNV  Carolynn Serve 05/28/2013, 11:01 AM

## 2013-05-28 NOTE — MAU Note (Signed)
+  HPT, confirmed at health Dept 10/09. Has been cramping of and on last few wks, started spotting- first noted Fri or Sat and again today, only once a day.  Hx of 2 miscarriages.

## 2013-05-29 LAB — GC/CHLAMYDIA PROBE AMP: CT Probe RNA: NEGATIVE

## 2013-06-04 ENCOUNTER — Inpatient Hospital Stay (HOSPITAL_COMMUNITY)
Admission: AD | Admit: 2013-06-04 | Discharge: 2013-06-05 | Disposition: A | Discharge status: Home / Self Care | Payer: Medicaid Other | Source: Ambulatory Visit | Attending: Obstetrics & Gynecology | Admitting: Obstetrics & Gynecology

## 2013-06-04 DIAGNOSIS — N949 Unspecified condition associated with female genital organs and menstrual cycle: Secondary | ICD-10-CM

## 2013-06-04 DIAGNOSIS — R109 Unspecified abdominal pain: Secondary | ICD-10-CM | POA: Insufficient documentation

## 2013-06-04 DIAGNOSIS — O99891 Other specified diseases and conditions complicating pregnancy: Secondary | ICD-10-CM | POA: Insufficient documentation

## 2013-06-05 ENCOUNTER — Encounter (HOSPITAL_COMMUNITY): Payer: Self-pay

## 2013-06-05 ENCOUNTER — Inpatient Hospital Stay (HOSPITAL_COMMUNITY): Payer: Medicaid Other

## 2013-06-05 DIAGNOSIS — O9989 Other specified diseases and conditions complicating pregnancy, childbirth and the puerperium: Secondary | ICD-10-CM

## 2013-06-05 LAB — URINALYSIS, ROUTINE W REFLEX MICROSCOPIC
Bilirubin Urine: NEGATIVE
Hgb urine dipstick: NEGATIVE
Ketones, ur: NEGATIVE mg/dL
Protein, ur: NEGATIVE mg/dL
Urobilinogen, UA: 0.2 mg/dL (ref 0.0–1.0)
pH: 6 (ref 5.0–8.0)

## 2013-06-05 NOTE — MAU Provider Note (Signed)
History     CSN: [id number]  Arrival date and time: 06/04/13 2343   First Provider Initiated Contact with Patient 06/05/13 0034      Chief Complaint  Patient presents with  . Abdominal Cramping   Abdominal Cramping    Tracy Grant is a 27 y.o. G3P0020 at [redacted]w[redacted]d who presents today with cramping. She rates the pain 3/10, and she has not tried taking any medication for the pain. She states "I don't want to take any medicine". She states that she has had the cramping for about 2 weeks. She denies any bleeding at this time.   Past Medical History  Diagnosis Date  . Hx of migraines   . Hyperlipidemia   . Allergy   . History of chicken pox   . Headache(784.0)   . Infection     trich    Past Surgical History  Procedure Laterality Date  . Adenoidectomy    . Dilation and curettage of uterus      Family History  Problem Relation Age of Onset  . Hypertension Mother   . Hypertension Father   . Hypertension Maternal Grandmother   . Hypertension Maternal Grandfather   . Hypertension Paternal Grandmother   . Hypertension Paternal Grandfather     History  Substance Use Topics  . Smoking status: Former Games developer  . Smokeless tobacco: Never Used     Comment: stopped with positive preg  test  . Alcohol Use: No    Allergies: No Known Allergies  Prescriptions prior to admission  Medication Sig Dispense Refill  . Prenatal Vit-Fe Fumarate-FA (PRENATAL MULTIVITAMIN) TABS tablet Take 1 tablet by mouth daily at 12 noon.        ROS Physical Exam   Blood pressure 124/76, pulse 81, temperature 98.1 F (36.7 C), temperature source Oral, resp. rate 18, last menstrual period 03/16/2013, SpO2 98.00%.  Physical Exam  Nursing note and vitals reviewed. Constitutional: She is oriented to person, place, and time. She appears well-developed and well-nourished. No distress.  Cardiovascular: Normal rate.   Respiratory: Effort normal.  GI: Soft. There is no tenderness.  Neurological:  She is alert and oriented to person, place, and time.  Skin: Skin is warm and dry.  Psychiatric: She has a normal mood and affect.    MAU Course  Procedures  Results for orders placed during the hospital encounter of 06/04/13 (from the past 24 hour(s))  URINALYSIS, ROUTINE W REFLEX MICROSCOPIC     Status: Abnormal   Collection Time    06/04/13 11:57 PM      Result Value Range   Color, Urine YELLOW  YELLOW   APPearance CLEAR  CLEAR   Specific Gravity, Urine >1.030 (*) 1.005 - 1.030   pH 6.0  5.0 - 8.0   Glucose, UA NEGATIVE  NEGATIVE mg/dL   Hgb urine dipstick NEGATIVE  NEGATIVE   Bilirubin Urine NEGATIVE  NEGATIVE   Ketones, ur NEGATIVE  NEGATIVE mg/dL   Protein, ur NEGATIVE  NEGATIVE mg/dL   Urobilinogen, UA 0.2  0.0 - 1.0 mg/dL   Nitrite NEGATIVE  NEGATIVE   Leukocytes, UA NEGATIVE  NEGATIVE    US Ob Transvaginal  06/05/2013   CLINICAL DATA:  Cramping. Estimated gestational age by previous ultrasound is 8 weeks 0 days.  EXAM: TRANSVAGINAL OB ULTRASOUND  TECHNIQUE: Transvaginal ultrasound was performed for complete evaluation of the gestation as well as the maternal uterus, adnexal regions, and pelvic cul-de-sac.  COMPARISON:  05/28/2013  FINDINGS: Intrauterine gestational sac: A single  intrauterine gestational sac is visualized.  Yolk sac:  Yolk sac is visualized.  Embryo:  Fetal pole is visualized.  Cardiac Activity: Fetal cardiac activity is observed.  Heart Rate: 167 bpm  CRL:   16.5  mm   8 w 1 d                  [place] EDC: 01/14/2014  Maternal uterus/adnexae: No subchorionic hemorrhage identified. No myometrial mass lesions visualized. Both ovaries are visualized and demonstrate normal follicular changes. No abnormal adnexal masses. No free pelvic fluid collections.  IMPRESSION: Single intrauterine pregnancy demonstrating appropriate interval growth since the previous study. Estimated gestational age by crown-rump length is 8 weeks 1 day.   Electronically Signed   By: Burman Nieves M.D.   On: 06/05/2013 01:23     Assessment and Plan   1. Pelvic pain complicating pregnancy, antepartum, first trimester    Danger signs reviewed Start [facility] as soon as possible Return to MAU as needed   Tracy Grant 06/05/2013, 12:41 AM

## 2013-06-05 NOTE — MAU Note (Signed)
States cramping for several days. Denies vaginal bleeding. Some discharge. Has had 2 SAB in past and is "worried".

## 2013-06-05 NOTE — MAU Provider Note (Signed)
Attestation of Attending Supervision of Advanced Practitioner (CNM/NP): Evaluation and management procedures were performed by the Advanced Practitioner under my supervision and collaboration. I have reviewed the Advanced Practitioner's note and chart, and I agree with the management and plan.  Dedric Ethington H. 8:56 AM   

## 2013-06-07 ENCOUNTER — Inpatient Hospital Stay (HOSPITAL_COMMUNITY): Payer: Medicaid Other

## 2013-06-07 ENCOUNTER — Inpatient Hospital Stay (HOSPITAL_COMMUNITY)
Admission: AD | Admit: 2013-06-07 | Discharge: 2013-06-07 | Disposition: A | Discharge status: Home / Self Care | Payer: Medicaid Other | Source: Ambulatory Visit | Attending: Obstetrics & Gynecology | Admitting: Obstetrics & Gynecology

## 2013-06-07 ENCOUNTER — Encounter (HOSPITAL_COMMUNITY): Payer: Self-pay | Admitting: General Practice

## 2013-06-07 DIAGNOSIS — O200 Threatened abortion: Secondary | ICD-10-CM | POA: Insufficient documentation

## 2013-06-07 LAB — CBC
MCH: 31.2 pg (ref 26.0–34.0)
MCV: 89.6 fL (ref 78.0–100.0)
Platelets: 197 10*3/uL (ref 150–400)
RDW: 11.7 % (ref 11.5–15.5)

## 2013-06-07 NOTE — MAU Note (Signed)
Labs drawn in triage.  

## 2013-06-07 NOTE — MAU Provider Note (Signed)
Chief Complaint: No chief complaint on file.   None    SUBJECTIVE HPI: Tracy Grant is a 27 y.o. G3P0020 at [redacted]w[redacted]d by LMP who presents with  Bleeding. Seen in MAu 06/05/2013. Live IUP confirmed.   Past Medical History  Diagnosis Date  . Hx of migraines   . Hyperlipidemia   . Allergy   . History of chicken pox   . Headache(784.0)   . Infection     trich   OB History  Gravida Para Term Preterm AB SAB TAB Ectopic Multiple Living  3 0 0 0 2 2 0 0 0 0     # Outcome Date GA Lbr Len/2nd Weight Sex Delivery Anes PTL Lv  3 CUR           2 SAB           1 SAB              Past Surgical History  Procedure Laterality Date  . Adenoidectomy    . Dilation and curettage of uterus     History   Social History  . Marital Status: Single    Spouse Name: N/A    Number of Children: 0  . Years of Education: 12+   Occupational History  .  [employer]   Social History Main Topics  . Smoking status: Former Games developer  . Smokeless tobacco: Never Used     Comment: stopped with positive preg  test  . Alcohol Use: No  . Drug Use: No  . Sexual Activity: Yes   Other Topics Concern  . Not on file   Social History Narrative   Regular exercise-no   Caffeine Use-yes   No current facility-administered medications on file prior to encounter.   Current Outpatient Prescriptions on File Prior to Encounter  Medication Sig Dispense Refill  . Prenatal Vit-Fe Fumarate-FA (PRENATAL MULTIVITAMIN) TABS tablet Take 1 tablet by mouth daily at 12 noon.       No Known Allergies  ROS: Pertinent items in HPI  OBJECTIVE Last menstrual period 03/16/2013. GENERAL: Well-developed, well-nourished female in no acute distress.  HEENT: Normocephalic HEART: normal rate RESP: normal effort ABDOMEN: Soft, non-tender EXTREMITIES: Nontender, no edema NEURO: Alert and oriented SPECULUM EXAM: NEFG, physiologic discharge, no blood noted, cervix clean BIMANUAL: cervix closed; uterus normal size, no  adnexal tenderness or masses  LAB RESULTS No results found for this or any previous visit (from the past 24 hour(s)).  IMAGING US Ob Comp Less 14 Wks  05/28/2013   CLINICAL DATA:  Cramping and spotting. Gestational age by last menstrual period 10 weeks 3 days.  EXAM: OBSTETRIC <14 WK [place] AND TRANSVAGINAL OB US  TECHNIQUE: Both transabdominal and transvaginal ultrasound examinations were performed for complete evaluation of the gestation as well as the maternal uterus, adnexal regions, and pelvic cul-de-sac. Transvaginal technique was performed to assess early pregnancy.  COMPARISON:  None.  FINDINGS: Intrauterine gestational sac: Single  Yolk sac:  Present  Embryo:  Present  Cardiac Activity: Present  Heart Rate:  134 bpm  CRL:   8  mm   6 w 6 d                  [place] EDC: 01/15/2014  Maternal uterus/adnexae: Normal bilateral ovaries. Small subchorionic hemorrhage. No free fluid.  IMPRESSION: 1. Single live intrauterine gestation with dating by ultrasound of 6 weeks 6 days. 2. Small subchorionic hematoma.   Electronically Signed   By: Tracy Gaines.D.  On: 05/28/2013 10:40   US Ob Transvaginal  06/05/2013   CLINICAL DATA:  Cramping. Estimated gestational age by previous ultrasound is 8 weeks 0 days.  EXAM: TRANSVAGINAL OB ULTRASOUND  TECHNIQUE: Transvaginal ultrasound was performed for complete evaluation of the gestation as well as the maternal uterus, adnexal regions, and pelvic cul-de-sac.  COMPARISON:  05/28/2013  FINDINGS: Intrauterine gestational sac: A single intrauterine gestational sac is visualized.  Yolk sac:  Yolk sac is visualized.  Embryo:  Fetal pole is visualized.  Cardiac Activity: Fetal cardiac activity is observed.  Heart Rate: 167 bpm  CRL:   16.5  mm   8 w 1 d                  [place] EDC: 01/14/2014  Maternal uterus/adnexae: No subchorionic hemorrhage identified. No myometrial mass lesions visualized. Both ovaries are visualized and demonstrate normal follicular changes. No abnormal adnexal  masses. No free pelvic fluid collections.  IMPRESSION: Single intrauterine pregnancy demonstrating appropriate interval growth since the previous study. Estimated gestational age by crown-rump length is 8 weeks 1 day.   Electronically Signed   By: Tracy Nieves M.D.   On: 06/05/2013 01:23   US Ob Transvaginal  05/28/2013   CLINICAL DATA:  Cramping and spotting. Gestational age by last menstrual period 10 weeks 3 days.  EXAM: OBSTETRIC <14 WK [place] AND TRANSVAGINAL OB US  TECHNIQUE: Both transabdominal and transvaginal ultrasound examinations were performed for complete evaluation of the gestation as well as the maternal uterus, adnexal regions, and pelvic cul-de-sac. Transvaginal technique was performed to assess early pregnancy.  COMPARISON:  None.  FINDINGS: Intrauterine gestational sac: Single  Yolk sac:  Present  Embryo:  Present  Cardiac Activity: Present  Heart Rate:  134 bpm  CRL:   8  mm   6 w 6 d                  [place] EDC: 01/15/2014  Maternal uterus/adnexae: Normal bilateral ovaries. Small subchorionic hemorrhage. No free fluid.  IMPRESSION: 1. Single live intrauterine gestation with dating by ultrasound of 6 weeks 6 days. 2. Small subchorionic hematoma.   Electronically Signed   By: Tracy Belt M.D.   On: 05/28/2013 10:40    MAU COURSE  ASSESSMENT Threatened miscarriage at 6 weeks 6 days RH positive PLAN Discharge home    Medication List    ASK your doctor about these medications       prenatal multivitamin Tabs tablet  Take 1 tablet by mouth daily at 12 noon.       Keep f/u appt with Dr. Tamela Oddi on October 30th.  [place], CNM 06/07/2013  8:44 PM   Pt seen and examined. Agree with above note.

## 2013-06-07 NOTE — MAU Note (Signed)
Pt reports vaginal bleeding x 30 minutes, has had bleeding with this pregnancy but this is more than before. Denies pain.

## 2013-06-15 ENCOUNTER — Ambulatory Visit (INDEPENDENT_AMBULATORY_CARE_PROVIDER_SITE_OTHER): Payer: Medicaid Other | Admitting: Advanced Practice Midwife

## 2013-06-15 ENCOUNTER — Encounter: Payer: Self-pay | Admitting: Advanced Practice Midwife

## 2013-06-15 VITALS — BP 121/83 | Temp 98.3°F | Wt 241.0 lb

## 2013-06-15 DIAGNOSIS — Z3401 Encounter for supervision of normal first pregnancy, first trimester: Secondary | ICD-10-CM

## 2013-06-15 DIAGNOSIS — Z3400 Encounter for supervision of normal first pregnancy, unspecified trimester: Secondary | ICD-10-CM

## 2013-06-15 LAB — POCT URINALYSIS DIPSTICK
Blood, UA: NEGATIVE
Nitrite, UA: NEGATIVE
Spec Grav, UA: 1.01
pH, UA: 8

## 2013-06-15 NOTE — Addendum Note (Signed)
Addended by: Elby Beck F on: 06/15/2013 04:35 PM   Modules accepted: Orders

## 2013-06-15 NOTE — Progress Notes (Signed)
Patient was having heavy bleeding x1d and cramping- patient was seen at [facility] and US showed heart beat. Patient states symptoms have resolved. P 88 Subjective:    Tracy Grant is being seen today for her first obstetrical visit.  This is not a planned pregnancy. She is at [redacted]w[redacted]d gestation. Her obstetrical history is significant for obesity. Relationship with FOB: significant other, living together. Patient does intend to breast feed. Pregnancy history fully reviewed.  Menstrual History: OB History   Grav Para Term Preterm Abortions TAB SAB Ect Mult Living   3 0 0 0 2 0 2 0 0 0       Menarche age: 29 Patient's last menstrual period was 03/16/2013.    The following portions of the patient's history were reviewed and updated as appropriate: allergies, current medications, past family history, past medical history, past social history, past surgical history and problem list.  Review of Systems A comprehensive review of systems was negative.    Objective:    BP 121/83  Temp(Src) 98.3 F (36.8 C)  Wt 241 lb (109.317 kg)  BMI 40.1 kg/m2  LMP 03/16/2013 General appearance: alert, cooperative and appears stated age Head: Normocephalic, without obvious abnormality, atraumatic Eyes: conjunctivae/corneas clear. PERRL, EOM's intact. Fundi benign. Ears: normal TM's and external ear canals both ears Nose: Nares normal. Septum midline. Mucosa normal. No drainage or sinus tenderness. Throat: lips, mucosa, and tongue normal; teeth and gums normal Neck: no adenopathy, no carotid bruit, no JVD, supple, symmetrical, trachea midline and thyroid not enlarged, symmetric, no tenderness/mass/nodules Back: symmetric, no curvature. ROM normal. No CVA tenderness. Lungs: clear to auscultation bilaterally Breasts: normal appearance, no masses or tenderness Heart: regular rate and rhythm, S1, S2 normal, no murmur, click, rub or gallop Abdomen: soft, non-tender; bowel sounds normal; no masses,  no  organomegaly Extremities: extremities normal, atraumatic, no cyanosis or edema Pulses: 2+ and symmetric Skin: Skin color, texture, turgor normal. No rashes or lesions Lymph nodes: Cervical, supraclavicular, and axillary nodes normal. Neurologic: Grossly normal    Assessment:    Pregnancy at [redacted]w[redacted]d weeks   History of miscarriage x2, high anxiety Bleeding and cramping this pregnancy +FHR today  Plan:    Initial labs drawn. Prenatal vitamins.  Counseling provided regarding continued use of seat belts, cessation of alcohol consumption, smoking or use of illicit drugs; infection precautions i.e., influenza/TDAP immunizations, toxoplasmosis,CMV, parvovirus, listeria and varicella; workplace safety, exercise during pregnancy; routine dental care, safe medications, sexual activity, hot tubs, saunas, pools, travel, caffeine use, fish and methlymercury, potential toxins, hair treatments, varicose veins Weight gain recommendations reviewed: underweight/BMI< 18.5--> gain 28 - 40 lbs; normal weight/BMI 18.5 - 24.9--> gain 25 - 35 lbs; overweight/BMI 25 - 29.9--> gain 15 - 25 lbs; obese/BMI >30->gain  11 - 20 lbs Problem list reviewed and updated. AFP3 discussed: plan . Role of ultrasound in pregnancy discussed; fetal survey: plan 18-20 weeks. Amniocentesis discussed: not indicated. Follow up in 2 weeks for FHR check. Patient should have had pap 02/2012, no results available. Most likely an error occurred. Plan pap after 1st trimester due to patients anxiety r/t miscarriage.  80% of 50 min visit spent on counseling and coordination of care.   Stokes Rattigan Wilson Singer CNM

## 2013-06-16 LAB — OBSTETRIC PANEL
Antibody Screen: NEGATIVE
Basophils Relative: 0 % (ref 0–1)
Eosinophils Absolute: 0.1 10*3/uL (ref 0.0–0.7)
Eosinophils Relative: 1 % (ref 0–5)
Hemoglobin: 12.3 g/dL (ref 12.0–15.0)
Hepatitis B Surface Ag: NEGATIVE
MCH: 31.2 pg (ref 26.0–34.0)
MCHC: 34.2 g/dL (ref 30.0–36.0)
MCV: 91.4 fL (ref 78.0–100.0)
Monocytes Relative: 8 % (ref 3–12)
Neutro Abs: 4 10*3/uL (ref 1.7–7.7)
Neutrophils Relative %: 59 % (ref 43–77)
Platelets: 211 10*3/uL (ref 150–400)
Rh Type: POSITIVE
Rubella: 4.93 Index — ABNORMAL HIGH (ref <0.90)

## 2013-06-16 LAB — VITAMIN D 25 HYDROXY (VIT D DEFICIENCY, FRACTURES): Vit D, 25-Hydroxy: 22 ng/mL — ABNORMAL LOW (ref 30–89)

## 2013-06-16 LAB — HIV ANTIBODY (ROUTINE TESTING W REFLEX): HIV: NONREACTIVE

## 2013-06-17 LAB — CULTURE, OB URINE: Colony Count: >=100000

## 2013-06-19 LAB — HEMOGLOBINOPATHY EVALUATION
Hemoglobin Other: 0 %
Hgb A2 Quant: 2.5 % (ref 2.2–3.2)
Hgb A: 97.1 % (ref 96.8–97.8)

## 2013-06-21 ENCOUNTER — Other Ambulatory Visit: Payer: Self-pay

## 2013-06-26 ENCOUNTER — Ambulatory Visit (INDEPENDENT_AMBULATORY_CARE_PROVIDER_SITE_OTHER): Payer: Medicaid Other | Admitting: Advanced Practice Midwife

## 2013-06-26 ENCOUNTER — Other Ambulatory Visit: Payer: Self-pay | Admitting: Advanced Practice Midwife

## 2013-06-26 ENCOUNTER — Ambulatory Visit (INDEPENDENT_AMBULATORY_CARE_PROVIDER_SITE_OTHER): Payer: Medicaid Other

## 2013-06-26 VITALS — BP 111/79 | Temp 97.8°F | Wt 238.0 lb

## 2013-06-26 DIAGNOSIS — O3680X1 Pregnancy with inconclusive fetal viability, fetus 1: Secondary | ICD-10-CM

## 2013-06-26 DIAGNOSIS — Z3401 Encounter for supervision of normal first pregnancy, first trimester: Secondary | ICD-10-CM

## 2013-06-26 DIAGNOSIS — O3680X0 Pregnancy with inconclusive fetal viability, not applicable or unspecified: Secondary | ICD-10-CM

## 2013-06-26 DIAGNOSIS — Z3400 Encounter for supervision of normal first pregnancy, unspecified trimester: Secondary | ICD-10-CM

## 2013-06-26 LAB — POCT URINALYSIS DIPSTICK
Blood, UA: NEGATIVE
Glucose, UA: NEGATIVE
Leukocytes, UA: NEGATIVE
Nitrite, UA: NEGATIVE
Protein, UA: NEGATIVE
Urobilinogen, UA: NEGATIVE

## 2013-06-26 LAB — US OB LIMITED

## 2013-06-26 MED ORDER — OB COMPLETE PETITE 35-5-1-200 MG PO CAPS: 1.0000 | ORAL_CAPSULE | Freq: Every day | ORAL | Status: DC

## 2013-06-26 NOTE — Progress Notes (Signed)
Pulse: 81

## 2013-06-26 NOTE — Progress Notes (Signed)
Patient doing well. Declines pap at this time until 6 weeks PP. Desires frequent visits, needing reassurance due to past miscarriages. Unable to auscultate FHR, reassuring formal US done in office.  Patient declines Quad screen. RTC for 2 weeks for FHR  Makailee Nudelman Wilson Singer CNM

## 2013-07-10 ENCOUNTER — Ambulatory Visit (INDEPENDENT_AMBULATORY_CARE_PROVIDER_SITE_OTHER): Payer: Medicaid Other | Admitting: Advanced Practice Midwife

## 2013-07-10 ENCOUNTER — Encounter: Payer: Medicaid Other | Admitting: Advanced Practice Midwife

## 2013-07-10 VITALS — BP 128/82 | Temp 98.4°F | Wt 239.2 lb

## 2013-07-10 DIAGNOSIS — Z3401 Encounter for supervision of normal first pregnancy, first trimester: Secondary | ICD-10-CM

## 2013-07-10 DIAGNOSIS — Z3400 Encounter for supervision of normal first pregnancy, unspecified trimester: Secondary | ICD-10-CM

## 2013-07-10 LAB — POCT URINALYSIS DIPSTICK
Bilirubin, UA: NEGATIVE
Blood, UA: NEGATIVE
Glucose, UA: NEGATIVE
Nitrite, UA: NEGATIVE
Spec Grav, UA: 1.015
Urobilinogen, UA: NEGATIVE

## 2013-07-10 NOTE — Progress Notes (Signed)
Pulse: 97

## 2013-07-10 NOTE — Progress Notes (Signed)
Patient doing well. FHR 160. Declines Quad. RTC in 2 weeks for reassurance due to hx of miscarriage.  Tracy Grant CNM

## 2013-07-27 ENCOUNTER — Ambulatory Visit (INDEPENDENT_AMBULATORY_CARE_PROVIDER_SITE_OTHER): Payer: Medicaid Other | Admitting: Advanced Practice Midwife

## 2013-07-27 VITALS — BP 119/83 | Temp 98.2°F | Wt 240.0 lb

## 2013-07-27 DIAGNOSIS — Z3402 Encounter for supervision of normal first pregnancy, second trimester: Secondary | ICD-10-CM

## 2013-07-27 DIAGNOSIS — Z3400 Encounter for supervision of normal first pregnancy, unspecified trimester: Secondary | ICD-10-CM

## 2013-07-27 LAB — POCT URINALYSIS DIPSTICK
Bilirubin, UA: NEGATIVE
Blood, UA: NEGATIVE
Ketones, UA: NEGATIVE
Nitrite, UA: NEGATIVE
Urobilinogen, UA: NEGATIVE
pH, UA: 7

## 2013-07-27 NOTE — Progress Notes (Signed)
P 92 Patient reports she is having some ligament pain. Patient has been craving cucumbers and sea salt.

## 2013-07-27 NOTE — Progress Notes (Signed)
Routine Obstetrical Visit  Subjective:    Tracy Grant is being seen today for her routine obstetrical visit. She is at [redacted]w[redacted]d gestation.   Patient reports no complaints.   Objective:     BP 119/83  Temp(Src) 98.2 F (36.8 C)  Wt 240 lb (108.863 kg)  LMP 03/16/2013 Physical Exam  Exam FHR 150    Assessment:    Pregnancy: [id number] Patient Active Problem List   Diagnosis Date Noted  . Supervision of normal first pregnancy 06/15/2013  . Migraine 08/08/2012       Plan:     Prenatal vitamins. Problem list reviewed and updated. 18-20 week [place] requested. Follow up in 3 weeks. 80% of 20 min visit spent on counseling and coordination of care.     Asyria Kolander 07/27/2013

## 2013-08-14 ENCOUNTER — Other Ambulatory Visit (INDEPENDENT_AMBULATORY_CARE_PROVIDER_SITE_OTHER): Payer: Medicaid Other

## 2013-08-14 ENCOUNTER — Encounter: Payer: Self-pay | Admitting: Obstetrics & Gynecology

## 2013-08-14 ENCOUNTER — Ambulatory Visit (INDEPENDENT_AMBULATORY_CARE_PROVIDER_SITE_OTHER): Payer: Medicaid Other | Admitting: Advanced Practice Midwife

## 2013-08-14 ENCOUNTER — Other Ambulatory Visit: Payer: Self-pay | Admitting: *Deleted

## 2013-08-14 VITALS — BP 122/80 | Temp 98.5°F | Wt 238.0 lb

## 2013-08-14 DIAGNOSIS — Z1389 Encounter for screening for other disorder: Secondary | ICD-10-CM

## 2013-08-14 DIAGNOSIS — Z3400 Encounter for supervision of normal first pregnancy, unspecified trimester: Secondary | ICD-10-CM

## 2013-08-14 DIAGNOSIS — O365990 Maternal care for other known or suspected poor fetal growth, unspecified trimester, not applicable or unspecified: Secondary | ICD-10-CM

## 2013-08-14 DIAGNOSIS — Z3402 Encounter for supervision of normal first pregnancy, second trimester: Secondary | ICD-10-CM

## 2013-08-14 DIAGNOSIS — Z363 Encounter for antenatal screening for malformations: Secondary | ICD-10-CM

## 2013-08-14 LAB — POCT URINALYSIS DIPSTICK
Bilirubin, UA: NEGATIVE
Leukocytes, UA: NEGATIVE
Protein, UA: NEGATIVE
Spec Grav, UA: 1.02
Urobilinogen, UA: NEGATIVE
pH, UA: 5

## 2013-08-14 LAB — US OB DETAIL + 14 WK

## 2013-08-14 NOTE — Progress Notes (Signed)
Subjective: Tracy Grant is a 27 y.o. at 18 weeks by early ultrasound  Patient denies vaginal leaking of fluid or bleeding, denies contractions.  Reports positive fetal movment.  Denies concerns today. Patient had her [place] today, she is here by herself, a little tearful. She found out it is a boy. It is also her birthday today. She is hoping her Mom and Olene Floss take her out.   Objective: Filed Vitals:   08/14/13 1059  BP: 122/80  Temp: 98.5 F (36.9 C)   150 FHR   Assessment: Patient Active Problem List   Diagnosis Date Noted  . Supervision of normal first pregnancy 06/15/2013  . Migraine 08/08/2012    Plan: Patient to return to clinic in 4 weeks ROS [place] today, patient expecting a boy. Consider depression screening [place] and offer additional resources. Reviewed warning signs in pregnancy. Patient to call with concerns PRN. Reviewed triage location.  20 min spent with patient greater than 80% spent in counseling and coordination of care.   Sharde Gover Wilson Singer CNM

## 2013-08-14 NOTE — Progress Notes (Signed)
HR - 87 Pt in office today for routine OB visit, reports frequent headaches sensitive to sounds

## 2013-08-20 ENCOUNTER — Encounter: Payer: Self-pay | Admitting: Obstetrics & Gynecology

## 2013-08-20 ENCOUNTER — Encounter: Payer: Self-pay | Admitting: Obstetrics

## 2013-08-20 LAB — US OB DETAIL + 14 WK

## 2013-08-21 ENCOUNTER — Encounter: Payer: Self-pay | Admitting: Advanced Practice Midwife

## 2013-08-23 ENCOUNTER — Other Ambulatory Visit: Payer: Self-pay | Admitting: *Deleted

## 2013-08-23 DIAGNOSIS — Z1389 Encounter for screening for other disorder: Secondary | ICD-10-CM

## 2013-08-24 ENCOUNTER — Other Ambulatory Visit: Payer: Self-pay | Admitting: Obstetrics & Gynecology

## 2013-08-24 ENCOUNTER — Ambulatory Visit (HOSPITAL_COMMUNITY)
Admission: RE | Admit: 2013-08-24 | Discharge: 2013-08-24 | Disposition: A | Discharge status: Home / Self Care | Payer: Medicaid Other | Source: Ambulatory Visit | Attending: Obstetrics & Gynecology | Admitting: Obstetrics & Gynecology

## 2013-08-24 DIAGNOSIS — Z1389 Encounter for screening for other disorder: Secondary | ICD-10-CM

## 2013-08-24 DIAGNOSIS — Z3689 Encounter for other specified antenatal screening: Secondary | ICD-10-CM | POA: Insufficient documentation

## 2013-08-27 ENCOUNTER — Encounter: Payer: Self-pay | Admitting: Obstetrics & Gynecology

## 2013-08-27 LAB — US OB DETAIL + 14 WK

## 2013-08-28 ENCOUNTER — Other Ambulatory Visit: Payer: Medicaid Other

## 2013-09-11 ENCOUNTER — Encounter: Payer: Self-pay | Admitting: Obstetrics

## 2013-09-11 ENCOUNTER — Ambulatory Visit (INDEPENDENT_AMBULATORY_CARE_PROVIDER_SITE_OTHER): Payer: Medicaid Other | Admitting: Obstetrics

## 2013-09-11 VITALS — BP 116/82 | Temp 97.1°F | Wt 240.0 lb

## 2013-09-11 DIAGNOSIS — R519 Headache, unspecified: Secondary | ICD-10-CM

## 2013-09-11 DIAGNOSIS — R51 Headache: Secondary | ICD-10-CM

## 2013-09-11 DIAGNOSIS — Z3480 Encounter for supervision of other normal pregnancy, unspecified trimester: Secondary | ICD-10-CM

## 2013-09-11 LAB — POCT URINALYSIS DIPSTICK
BILIRUBIN UA: NEGATIVE
GLUCOSE UA: NEGATIVE
Ketones, UA: NEGATIVE
LEUKOCYTES UA: NEGATIVE
NITRITE UA: NEGATIVE
Protein, UA: NEGATIVE
RBC UA: NEGATIVE
Spec Grav, UA: <=1.005
Urobilinogen, UA: NEGATIVE
pH, UA: 6

## 2013-09-11 MED ORDER — BUTALBITAL-APAP-CAFFEINE 50-325-40 MG PO TABS: 2.0000 | ORAL_TABLET | Freq: Four times a day (QID) | ORAL | Status: DC | PRN

## 2013-09-11 NOTE — Progress Notes (Signed)
Pulse:86 Patient states there are no concerns.

## 2013-10-04 ENCOUNTER — Other Ambulatory Visit: Payer: Self-pay | Admitting: *Deleted

## 2013-10-04 DIAGNOSIS — O365990 Maternal care for other known or suspected poor fetal growth, unspecified trimester, not applicable or unspecified: Secondary | ICD-10-CM

## 2013-10-09 ENCOUNTER — Encounter: Payer: Medicaid Other | Admitting: Advanced Practice Midwife

## 2013-10-09 ENCOUNTER — Other Ambulatory Visit: Payer: Medicaid Other

## 2013-10-09 ENCOUNTER — Encounter: Payer: Medicaid Other | Admitting: Obstetrics

## 2013-10-10 ENCOUNTER — Other Ambulatory Visit: Payer: Medicaid Other

## 2013-10-10 ENCOUNTER — Encounter: Payer: Medicaid Other | Admitting: Obstetrics

## 2013-10-12 ENCOUNTER — Other Ambulatory Visit: Payer: Medicaid Other

## 2013-10-12 ENCOUNTER — Encounter: Payer: Medicaid Other | Admitting: Obstetrics & Gynecology

## 2013-10-16 ENCOUNTER — Other Ambulatory Visit: Payer: Medicaid Other

## 2013-10-17 ENCOUNTER — Ambulatory Visit (INDEPENDENT_AMBULATORY_CARE_PROVIDER_SITE_OTHER): Payer: Medicaid Other | Admitting: Obstetrics & Gynecology

## 2013-10-17 ENCOUNTER — Other Ambulatory Visit: Payer: Medicaid Other

## 2013-10-17 ENCOUNTER — Encounter: Payer: Self-pay | Admitting: Obstetrics & Gynecology

## 2013-10-17 ENCOUNTER — Ambulatory Visit (INDEPENDENT_AMBULATORY_CARE_PROVIDER_SITE_OTHER): Payer: Medicaid Other

## 2013-10-17 VITALS — BP 120/76 | Temp 98.7°F | Wt 248.0 lb

## 2013-10-17 DIAGNOSIS — O365990 Maternal care for other known or suspected poor fetal growth, unspecified trimester, not applicable or unspecified: Secondary | ICD-10-CM

## 2013-10-17 DIAGNOSIS — Z3400 Encounter for supervision of normal first pregnancy, unspecified trimester: Secondary | ICD-10-CM

## 2013-10-17 LAB — POCT URINALYSIS DIPSTICK
Bilirubin, UA: NEGATIVE
Blood, UA: NEGATIVE
Glucose, UA: NEGATIVE
Ketones, UA: NEGATIVE
NITRITE UA: NEGATIVE
SPEC GRAV UA: 1.02
Urobilinogen, UA: NEGATIVE
pH, UA: 5

## 2013-10-17 LAB — US OB DETAIL + 14 WK

## 2013-10-17 NOTE — Progress Notes (Signed)
Pulse: 101 Patient states that yesterday she had a stabbing pain in her lower abdomen. Patient states that she is having numbness on the outside of her right thigh if she is standing or walking. Patient states she is having shortness of breath all of the time. Patient states that it has even woken her up out of her sleep once or twice.

## 2013-10-18 ENCOUNTER — Encounter: Payer: Self-pay | Admitting: Obstetrics & Gynecology

## 2013-10-18 LAB — CBC
HCT: 35 % — ABNORMAL LOW (ref 36.0–46.0)
Hemoglobin: 11.9 g/dL — ABNORMAL LOW (ref 12.0–15.0)
MCH: 32.6 pg (ref 26.0–34.0)
MCHC: 34 g/dL (ref 30.0–36.0)
MCV: 95.9 fL (ref 78.0–100.0)
Platelets: 208 10*3/uL (ref 150–400)
RBC: 3.65 MIL/uL — AB (ref 3.87–5.11)
RDW: 13.9 % (ref 11.5–15.5)
WBC: 7.9 10*3/uL (ref 4.0–10.5)

## 2013-10-18 LAB — GLUCOSE TOLERANCE, 2 HOURS W/ 1HR
GLUCOSE, FASTING: 87 mg/dL (ref 70–99)
Glucose, 1 hour: 104 mg/dL (ref 70–170)
Glucose, 2 hour: 125 mg/dL (ref 70–139)

## 2013-10-18 LAB — RPR

## 2013-10-18 LAB — HIV ANTIBODY (ROUTINE TESTING W REFLEX): HIV: NONREACTIVE

## 2013-10-20 NOTE — Patient Instructions (Signed)
Tetanus, Diphtheria (Td) Vaccine What You Need to Know WHY GET VACCINATED? Tetanus  and diphtheria are very serious diseases. They are rare in the [place] today, but people who do become infected often have severe complications. Td vaccine is used to protect adolescents and adults from both of these diseases. Both tetanus and diphtheria are infections caused by bacteria. Diphtheria spreads from person to person through coughing or sneezing. Tetanus-causing bacteria enter the body through cuts, scratches, or wounds. TETANUS (Lockjaw) causes painful muscle tightening and stiffness, usually all over the body.  It can lead to tightening of muscles in the head and neck so you can't open your mouth, swallow, or sometimes even breathe. Tetanus kills about 1 out of every 5 people who are infected. DIPHTHERIA can cause a thick coating to form in the back of the throat.  It can lead to breathing problems, paralysis, heart failure, and death. Before vaccines, the [place] saw as many as 200,000 cases a year of diphtheria and hundreds of cases of tetanus. Since vaccination began, cases of both diseases have dropped by about 99%. TD VACCINE Td vaccine can protect adolescents and adults from tetanus and diphtheria. Td is usually given as a booster dose every 10 years but it can also be given earlier after a severe and dirty wound or burn. Your doctor can give you more information. Td may safely be given at the same time as other vaccines. SOME PEOPLE SHOULD NOT GET THIS VACCINE  If you ever had a life-threatening allergic reaction after a dose of any tetanus or diphtheria containing vaccine, OR if you have a severe allergy to any part of this vaccine, you should not get Td. Tell your doctor if you have any severe allergies.  Talk to your doctor if you:  have epilepsy or another nervous system problem,  had severe pain or swelling after any vaccine containing diphtheria or tetanus,  ever had  Guillain Barr Syndrome (GBS),  aren't feeling well on the day the shot is scheduled. RISKS OF A VACCINE REACTION With a vaccine, like any medicine, there is a chance of side effects. These are usually mild and go away on their own. Serious side effects are also possible, but are very rare. Most people who get Td vaccine do not have any problems with it. Mild Problems  following Td (Did not interfere with activities)  Pain where the shot was given (about 8 people in 10)  Redness or swelling where the shot was given (about 1 person in 3)  Mild fever (about 1 person in 15)  Headache or Tiredness (uncommon) Moderate Problems following Td (Interfered with activities, but did not require medical attention)  Fever over 102 F (38.9 C) (rare) Severe Problems  following Td (Unable to perform usual activities; required medical attention)  Swelling, severe pain, bleeding, or redness in the arm where the shot was given (rare). Problems that could happen after any vaccine:  Brief fainting spells can happen after any medical procedure, including vaccination. Sitting or lying down for about 15 minutes can help prevent fainting, and injuries caused by a fall. Tell your doctor if you feel dizzy, or have vision changes or ringing in the ears.  Severe shoulder pain and reduced range of motion in the arm where a shot was given can happen, very rarely, after a vaccination.  Severe allergic reactions from a vaccine are very rare, estimated at less than 1 in a million doses. If one were to occur, it would   usually be within a few minutes to a few hours after the vaccination. WHAT IF THERE IS A SERIOUS REACTION? What should I look for?  Look for anything that concerns you, such as signs of a severe allergic reaction, very high fever, or behavior changes. Signs of a severe allergic reaction can include hives, swelling of the face and throat, difficulty breathing, a fast heartbeat, dizziness, and  weakness. These would usually start a few minutes to a few hours after the vaccination. What should I do?  If you think it is a severe allergic reaction or other emergency that can't wait, call 911 or get the person to the nearest hospital. Otherwise, call your doctor.  Afterward, the reaction should be reported to the Vaccine Adverse Event Reporting System (VAERS). Your doctor might file this report, or, you can do it yourself through the VAERS website or by calling 1-253-651-9003. VAERS is only for reporting reactions. They do not give medical advice. THE NATIONAL VACCINE INJURY COMPENSATION PROGRAM The National Vaccine Injury Compensation Program (VICP) is a federal program that was created to compensate people who may have been injured by certain vaccines. Persons who believe they may have been injured by a vaccine can learn about the program and about filing a claim by calling 1-(315) 680-5574 or visiting the [facility] website. HOW CAN I LEARN MORE?  Ask your doctor.  Contact your local or state health department.  Contact the Centers for Disease Control and Prevention (CDC):  Call 404-314-1963 (1-800-CDC-INFO)  Visit CDC's vaccines website CDC Td Vaccine Interim VIS (09/19/12) Document Released: 05/30/2006 Document Revised: 11/27/2012 Document Reviewed: 11/22/2012 [facility] Patient Information 2014 [place], [place]. Preterm Labor Information Preterm labor is when labor starts at less than 37 weeks of pregnancy. The normal length of a pregnancy is 39 to 41 weeks. CAUSES Often, there is no identifiable underlying cause as to why a woman goes into preterm labor. One of the most common known causes of preterm labor is infection. Infections of the uterus, cervix, vagina, amniotic sac, bladder, kidney, or even the lungs (pneumonia) can cause labor to start. Other suspected causes of preterm labor include:   Urogenital infections, such as yeast infections and bacterial vaginosis.   Uterine  abnormalities (uterine shape, uterine septum, fibroids, or bleeding from the placenta).   A cervix that has been operated on (it may fail to stay closed).   Malformations in the fetus.   Multiple gestations (twins, triplets, and so on).   Breakage of the amniotic sac.  RISK FACTORS  Having a previous history of preterm labor.   Having premature rupture of membranes (PROM).   Having a placenta that covers the opening of the cervix (placenta previa).   Having a placenta that separates from the uterus (placental abruption).   Having a cervix that is too weak to hold the fetus in the uterus (incompetent cervix).   Having too much fluid in the amniotic sac (polyhydramnios).   Taking illegal drugs or smoking while pregnant.   Not gaining enough weight while pregnant.   Being younger than 36 and older than 26 years old.   Having a low socioeconomic status.   Being African American. SYMPTOMS Signs and symptoms of preterm labor include:   Menstrual-like cramps, abdominal pain, or back pain.  Uterine contractions that are regular, as frequent as six in an hour, regardless of their intensity (may be mild or painful).  Contractions that start on the top of the uterus and spread down to the lower abdomen and  back.   A sense of increased pelvic pressure.   A watery or bloody mucus discharge that comes from the vagina.  TREATMENT Depending on the length of the pregnancy and other circumstances, your health care provider may suggest bed rest. If necessary, there are medicines that can be given to stop contractions and to mature the fetal lungs. If labor happens before 34 weeks of pregnancy, a prolonged hospital stay may be recommended. Treatment depends on the condition of both you and the fetus.  WHAT SHOULD YOU DO IF YOU THINK YOU ARE IN PRETERM LABOR? Call your health care provider right away. You will need to go to the hospital to get checked immediately. HOW CAN  YOU PREVENT PRETERM LABOR IN FUTURE PREGNANCIES? You should:   Stop smoking if you smoke.  Maintain healthy weight gain and avoid chemicals and drugs that are not necessary.  Be watchful for any type of infection.  Inform your health care provider if you have a known history of preterm labor. Document Released: 10/23/2003 Document Revised: 04/04/2013 Document Reviewed: 09/04/2012 [facility] Patient Information 2014 [place].

## 2013-10-24 DIAGNOSIS — Z3400 Encounter for supervision of normal first pregnancy, unspecified trimester: Secondary | ICD-10-CM

## 2013-10-26 ENCOUNTER — Encounter: Payer: Self-pay | Admitting: Advanced Practice Midwife

## 2013-10-31 ENCOUNTER — Ambulatory Visit (INDEPENDENT_AMBULATORY_CARE_PROVIDER_SITE_OTHER): Payer: Medicaid Other | Admitting: Obstetrics

## 2013-10-31 VITALS — BP 107/72 | Temp 97.7°F | Wt 248.0 lb

## 2013-10-31 DIAGNOSIS — Z3400 Encounter for supervision of normal first pregnancy, unspecified trimester: Secondary | ICD-10-CM

## 2013-10-31 LAB — POCT URINALYSIS DIPSTICK
BILIRUBIN UA: NEGATIVE
Blood, UA: NEGATIVE
Glucose, UA: NEGATIVE
KETONES UA: NEGATIVE
Nitrite, UA: NEGATIVE
Spec Grav, UA: 1.015
Urobilinogen, UA: NEGATIVE
pH, UA: 6.5

## 2013-10-31 NOTE — Progress Notes (Signed)
Pulse:93 Patient states she is having lower abdominal and back pain. Patient states she has pelvic pressure. Patient states that in the mornings when she first wakes up he face is swollen. Patient states her hands swell throughout the day. Patient states her feet and ankles swell. Patient denies any concerns.

## 2013-11-01 ENCOUNTER — Encounter: Payer: Medicaid Other | Admitting: Advanced Practice Midwife

## 2013-11-01 ENCOUNTER — Encounter: Payer: Self-pay | Admitting: Obstetrics

## 2013-11-07 ENCOUNTER — Encounter: Payer: Self-pay | Admitting: Obstetrics

## 2013-11-07 ENCOUNTER — Ambulatory Visit (INDEPENDENT_AMBULATORY_CARE_PROVIDER_SITE_OTHER): Payer: Medicaid Other | Admitting: Obstetrics

## 2013-11-07 VITALS — BP 130/84

## 2013-11-07 DIAGNOSIS — Z3400 Encounter for supervision of normal first pregnancy, unspecified trimester: Secondary | ICD-10-CM

## 2013-11-07 LAB — POCT URINALYSIS DIPSTICK
BILIRUBIN UA: NEGATIVE
Blood, UA: NEGATIVE
GLUCOSE UA: NEGATIVE
Ketones, UA: NEGATIVE
LEUKOCYTES UA: NEGATIVE
NITRITE UA: NEGATIVE
Protein, UA: NEGATIVE
Spec Grav, UA: 1.015
Urobilinogen, UA: NEGATIVE
pH, UA: 7

## 2013-11-07 NOTE — Progress Notes (Signed)
Pulse 108 Pt states that she is having lower pelvic pressure and cramping.  Pt states that she has noticed this more since this morning.   Pt states that this seems to be worse when she is up and walking. Pt states that she is having some vaginal swelling for the past few weeks.

## 2013-11-07 NOTE — Progress Notes (Signed)
Doing well.  Routine.

## 2013-11-15 ENCOUNTER — Ambulatory Visit (INDEPENDENT_AMBULATORY_CARE_PROVIDER_SITE_OTHER): Payer: Medicaid Other | Admitting: Obstetrics

## 2013-11-15 ENCOUNTER — Encounter: Payer: Medicaid Other | Admitting: Obstetrics

## 2013-11-15 VITALS — BP 124/80 | Temp 98.5°F | Wt 249.0 lb

## 2013-11-15 DIAGNOSIS — Z3400 Encounter for supervision of normal first pregnancy, unspecified trimester: Secondary | ICD-10-CM

## 2013-11-15 LAB — POCT URINALYSIS DIPSTICK
Bilirubin, UA: NEGATIVE
GLUCOSE UA: NEGATIVE
Leukocytes, UA: NEGATIVE
Nitrite, UA: NEGATIVE
Protein, UA: NEGATIVE
RBC UA: NEGATIVE
Spec Grav, UA: 1.015
UROBILINOGEN UA: NEGATIVE
pH, UA: 5

## 2013-11-15 NOTE — Progress Notes (Signed)
Pulse 94 Pt  States that she has been having period like cramps for the past of weeks.  Pt states that she is also having some vaginal pain.

## 2013-11-19 ENCOUNTER — Encounter: Payer: Self-pay | Admitting: Obstetrics

## 2013-11-29 ENCOUNTER — Encounter: Payer: Self-pay | Admitting: Obstetrics

## 2013-11-29 ENCOUNTER — Ambulatory Visit (INDEPENDENT_AMBULATORY_CARE_PROVIDER_SITE_OTHER): Payer: Medicaid Other | Admitting: Obstetrics

## 2013-11-29 VITALS — BP 133/82 | Temp 98.8°F | Wt 253.0 lb

## 2013-11-29 DIAGNOSIS — Z3400 Encounter for supervision of normal first pregnancy, unspecified trimester: Secondary | ICD-10-CM

## 2013-11-29 LAB — POCT URINALYSIS DIPSTICK
Bilirubin, UA: NEGATIVE
Blood, UA: NEGATIVE
Glucose, UA: NEGATIVE
Ketones, UA: NEGATIVE
Nitrite, UA: NEGATIVE
SPEC GRAV UA: 1.025
Urobilinogen, UA: NEGATIVE
pH, UA: 5

## 2013-11-29 NOTE — Progress Notes (Signed)
Pulse- 101 Patient states she has period like cramping every few days.

## 2013-12-13 ENCOUNTER — Ambulatory Visit (INDEPENDENT_AMBULATORY_CARE_PROVIDER_SITE_OTHER): Payer: Medicaid Other | Admitting: Obstetrics

## 2013-12-13 ENCOUNTER — Encounter: Payer: Self-pay | Admitting: Obstetrics

## 2013-12-13 VITALS — BP 129/85 | HR 110 | Temp 97.3°F | Wt 246.0 lb

## 2013-12-13 DIAGNOSIS — Z3480 Encounter for supervision of other normal pregnancy, unspecified trimester: Secondary | ICD-10-CM

## 2013-12-13 LAB — POCT URINALYSIS DIPSTICK
BILIRUBIN UA: NEGATIVE
Blood, UA: NEGATIVE
Glucose, UA: NEGATIVE
Ketones, UA: NEGATIVE
Leukocytes, UA: NEGATIVE
NITRITE UA: NEGATIVE
Spec Grav, UA: 1.025
Urobilinogen, UA: NEGATIVE
pH, UA: 6

## 2013-12-13 NOTE — Progress Notes (Signed)
Subjective:    Tracy Grant is a 28 y.o. female being seen today for her obstetrical visit. She is at [redacted]w[redacted]d gestation. Patient reports no complaints. Fetal movement: normal.  Problem List Items Addressed This Visit   None    Visit Diagnoses   Supervision of other normal pregnancy    -  Primary    Relevant Orders       Strep B DNA probe       POCT urinalysis dipstick (Completed)      Patient Active Problem List   Diagnosis Date Noted  . Supervision of normal first pregnancy 06/15/2013  . Migraine 08/08/2012   Objective:    BP 129/85  Pulse 110  Temp(Src) 97.3 F (36.3 C)  Wt 246 lb (111.585 kg)  LMP 03/16/2013 FHT:  150 BPM  Uterine Size: size equals dates  Presentation: unsure     Assessment:    Pregnancy @ [redacted]w[redacted]d weeks   Plan:     labs reviewed, problem list updated Consent signed. GBS sent TDAP offered  Rhogam given for RH negative Pediatrician: discussed. Infant feeding: plans to breastfeed. Maternity leave: not discussed. Cigarette smoking: quit before pregnancy. Orders Placed This Encounter  Procedures  . Strep B DNA probe  . POCT urinalysis dipstick   No orders of the defined types were placed in this encounter.   Follow up in 1 Week.

## 2013-12-15 LAB — STREP B DNA PROBE: STREP GROUP B AG: NEGATIVE

## 2013-12-20 ENCOUNTER — Ambulatory Visit (INDEPENDENT_AMBULATORY_CARE_PROVIDER_SITE_OTHER): Payer: Medicaid Other | Admitting: Obstetrics

## 2013-12-20 ENCOUNTER — Encounter: Payer: Self-pay | Admitting: Obstetrics

## 2013-12-20 VITALS — BP 111/75 | HR 99 | Temp 98.9°F | Wt 251.0 lb

## 2013-12-20 DIAGNOSIS — Z3400 Encounter for supervision of normal first pregnancy, unspecified trimester: Secondary | ICD-10-CM

## 2013-12-20 LAB — POCT URINALYSIS DIPSTICK
Glucose, UA: NEGATIVE
Ketones, UA: NEGATIVE
Nitrite, UA: NEGATIVE
Spec Grav, UA: 1.015
pH, UA: 6

## 2013-12-20 NOTE — Progress Notes (Signed)
Subjective:    Tracy Grant is a 28 y.o. female being seen today for her obstetrical visit. She is at [redacted]w[redacted]d gestation. Patient reports no complaints. Fetal movement: normal.  Problem List Items Addressed This Visit   Supervision of normal first pregnancy - Primary   Relevant Orders      POCT urinalysis dipstick (Completed)     Patient Active Problem List   Diagnosis Date Noted  . Supervision of normal first pregnancy 06/15/2013  . Migraine 08/08/2012   Objective:    BP 111/75  Pulse 99  Temp(Src) 98.9 F (37.2 C)  Wt 251 lb (113.853 kg)  LMP 03/16/2013 FHT:  150 BPM  Uterine Size: size equals dates  Presentation: unsure     Assessment:    Pregnancy @ 104w2d weeks   Plan:     labs reviewed, problem list updated Consent signed. GBS sent TDAP offered  Rhogam given for RH negative Pediatrician: discussed. Infant feeding: plans to breastfeed. Maternity leave: not discussed. Cigarette smoking: quit before pregnancy. Orders Placed This Encounter  Procedures  . POCT urinalysis dipstick   No orders of the defined types were placed in this encounter.   Follow up in 1 Week.

## 2013-12-23 ENCOUNTER — Encounter (HOSPITAL_COMMUNITY): Payer: Self-pay | Admitting: *Deleted

## 2013-12-23 ENCOUNTER — Inpatient Hospital Stay (HOSPITAL_COMMUNITY)
Admission: AD | Admit: 2013-12-23 | Discharge: 2013-12-23 | Disposition: A | Discharge status: Home / Self Care | Payer: Medicaid Other | Source: Ambulatory Visit | Attending: Obstetrics | Admitting: Obstetrics

## 2013-12-23 ENCOUNTER — Encounter (HOSPITAL_COMMUNITY): Payer: Self-pay

## 2013-12-23 DIAGNOSIS — M549 Dorsalgia, unspecified: Secondary | ICD-10-CM | POA: Insufficient documentation

## 2013-12-23 DIAGNOSIS — O9989 Other specified diseases and conditions complicating pregnancy, childbirth and the puerperium: Principal | ICD-10-CM

## 2013-12-23 DIAGNOSIS — O99891 Other specified diseases and conditions complicating pregnancy: Secondary | ICD-10-CM | POA: Insufficient documentation

## 2013-12-23 DIAGNOSIS — N898 Other specified noninflammatory disorders of vagina: Secondary | ICD-10-CM | POA: Insufficient documentation

## 2013-12-23 LAB — URINALYSIS, ROUTINE W REFLEX MICROSCOPIC
Bilirubin Urine: NEGATIVE
Glucose, UA: NEGATIVE mg/dL
Ketones, ur: NEGATIVE mg/dL
Nitrite: NEGATIVE
PH: 6.5 (ref 5.0–8.0)
Protein, ur: NEGATIVE mg/dL
Urobilinogen, UA: 0.2 mg/dL (ref 0.0–1.0)

## 2013-12-23 LAB — URINE MICROSCOPIC-ADD ON

## 2013-12-23 MED ORDER — OXYCODONE-ACETAMINOPHEN 5-325 MG PO TABS
2.0000 | ORAL_TABLET | Freq: Once | ORAL | Status: AC
  Administered 2013-12-23: 2 via ORAL
  Filled 2013-12-23: qty 2

## 2013-12-23 MED ORDER — PROMETHAZINE HCL 25 MG PO TABS
25.0000 mg | ORAL_TABLET | Freq: Once | ORAL | Status: AC
  Administered 2013-12-23: 25 mg via ORAL
  Filled 2013-12-23: qty 1

## 2013-12-23 NOTE — Discharge Instructions (Signed)

## 2013-12-23 NOTE — MAU Note (Signed)
Pt states here for back pain since last night. Unsure if she's having contractions. No bleeding or lof. Does have increased vaginal discharge.

## 2013-12-23 NOTE — MAU Note (Signed)
C/o of back pain and contractions. Not sure how far apart contractions are, has not been timing them. Denies vaginal bleeding and LOF. Good fetal movement.

## 2013-12-23 NOTE — Progress Notes (Signed)
Notified pt here for back pain, cervix internally closed, u/a results discussed, orders obtained.

## 2013-12-27 ENCOUNTER — Encounter: Payer: Self-pay | Admitting: Obstetrics & Gynecology

## 2013-12-27 ENCOUNTER — Ambulatory Visit (INDEPENDENT_AMBULATORY_CARE_PROVIDER_SITE_OTHER): Payer: Medicaid Other | Admitting: Obstetrics & Gynecology

## 2013-12-27 VITALS — BP 120/78 | HR 94 | Temp 98.3°F | Wt 250.0 lb

## 2013-12-27 DIAGNOSIS — B9789 Other viral agents as the cause of diseases classified elsewhere: Secondary | ICD-10-CM

## 2013-12-27 DIAGNOSIS — B349 Viral infection, unspecified: Secondary | ICD-10-CM

## 2013-12-27 DIAGNOSIS — Z3400 Encounter for supervision of normal first pregnancy, unspecified trimester: Secondary | ICD-10-CM

## 2013-12-27 LAB — POCT URINALYSIS DIPSTICK
Glucose, UA: NEGATIVE
NITRITE UA: NEGATIVE
PH UA: 6
RBC UA: NEGATIVE
Spec Grav, UA: 1.01

## 2013-12-27 NOTE — Progress Notes (Signed)
Subjective:    Tracy Grant is a 28 y.o. female being seen today for her obstetrical visit. She is at [redacted]w[redacted]d  gestation. Patient reports no complaints. Fetal movement  Problem List Items Addressed This Visit   Supervision of normal first pregnancy - Primary   Relevant Orders      POCT urinalysis dipstick    Other Visit Diagnoses   Viral syndrome          Patient Active Problem List   Diagnosis Date Noted  . Supervision of normal first pregnancy 06/15/2013  . Migraine 08/08/2012   Objective:    BP 120/78  Pulse 94  Temp(Src) 98.3 F (36.8 C)  Wt 113.399 kg (250 lb)  LMP 03/16/2013 FHT:  140 BPM  Uterine Size: size equals dates  Presentation: cephalic     Assessment:    Pregnancy @ [redacted]w[redacted]d weeks   Plan:     labs reviewed, problem list updated  Orders Placed This Encounter  Procedures  . POCT urinalysis dipstick   Meds ordered this encounter  Medications  . acetaminophen-codeine (TYLENOL #3) 300-30 MG per tablet    Sig: Take 1 tablet by mouth every 4 (four) hours as needed for moderate pain.   Follow up in 1 Week.

## 2013-12-27 NOTE — Patient Instructions (Signed)
Sudden Infant Death Syndrome (SIDS): Sleeping Position SIDS is the sudden death of a healthy infant. The cause of SIDS is not known. However, there are certain factors that put the baby at risk, such as:  Babies placed on their stomach or side to sleep.  The baby being born earlier than normal (prematurity).  Being of [place] American, Native [place], and [place] Native descent.  Being a female. SIDS is seen more often in female babies than in female babies.  Sleeping on a soft surface.  Overheating.  Having a mother who smokes or uses illegal drugs.  Being an infant of a mother who is very young.  Having poor prenatal care.  Babies that had a low weight at birth.  Abnormalities of the placenta, the organ that provides nutrition in the womb.  Babies born in the fall or winter months.  Recent respiratory tract infection. Although it is recommended that most babies should be put on their backs to sleep, some questions have arisen: IS THE SIDE POSITION AS EFFECTIVE AS THE BACK? The side position is not recommended because there is still an increased chance of SIDS compared to the back position. Your baby should be placed on his or her back every time he or she sleeps. ARE THERE ANY BABIES WHO SHOULD BE PLACED ON THEIR TUMMY FOR SLEEP? Babies with certain disorders have fewer problems when lying on their tummy. These babies include:  Infants with symptomatic gastro-esophageal reflux (GERD). Reflux is usually less in the tummy position.  Babies with certain upper airway malformations, such as Robin syndrome. There are fewer occurrences of the airway being blocked when lying on the stomach. Before letting your baby sleep on his/her tummy, discuss with your caregiver. If your baby has one of the above problems, your caregiver will help you decide if the benefits of tummy sleeping are more than the small increased risk for SIDS. Be sure to avoid overheating and soft bedding as these risk  factors are troublesome for belly sleeping infants. SHOULD HEALTHY BABIES EVER BE PLACED ON THE TUMMY? Having tummy time while the baby is awake is important for movement (motor) development. It can also lower the chance of a flattened head (positional plagiocephaly). Flattened head can be the result of spending too much time on their back. Tummy time when the baby is awake and watched by an adult is good for baby's development. WHICH SLEEPING POSITION IS BEST FOR A BABY BORN EARLY (PRE-TERM) AFTER LEAVING THE HOSPITAL? In the nursery, babies who are born early (pre-term) often receive care in a position lying on their backs. Once recovered and ready to leave the hospital, there is no reason to believe that they should be treated any differently than a baby who was born at term. Unless there are specific instructions to do otherwise, these babies should be placed on their backs to sleep. IN WHAT POSITION ARE FULL-TERM BABIES PUT TO SLEEP IN HOSPITAL NURSERIES? Unless there is a specific reason to do otherwise, babies are placed on their backs in hospital nurseries.  IF A BABY DOES NOT SLEEP WELL ON HIS OR HER BACK, IS IT OKAY TO TURN HIM OR HER TO A SIDE OR TUMMY POSITION? No. Because of the risk of SIDS, the side and tummy positions are not recommended. Positional preference appears to be a learned behavior among infants from birth to 634 to 316 months of age. Infants who are always placed on their backs will become used to this position. If your baby  is not sleeping well, look for possible reasons. For example, be sure to avoid overheating or the use of soft bedding. AT WHAT AGE CAN YOU STOP USING THE BACK POSITION FOR SLEEP? The peak risk for SIDS is age 26 to 8224 weeks. Although less common, it can occur up to 26 year of age. It is recommended that you place your baby on his/her back up to age 17 year.  DO I NEED TO KEEP CHECKING ON MY BABY AFTER LAYING HIM OR HER DOWN FOR SLEEP IN A BACK-LYING POSITION?    No. Very young infants placed on their backs cannot roll onto their tummies. HOW SHOULD HOSPITALS PLACE BABIES DOWN FOR SLEEP IF THEY ARE READMITTED? As a general guideline, hospitalized infants should sleep on their backs just as they would at home. However, there may be a medical problem that would require a side or tummy position.  WILL BABIES ASPIRATE ON THEIR BACKS? There is no evidence that healthy babies are more likely to inhale stomach contents (have aspiration episodes) when they are on their backs. In the majority of the small number of reported cases of death due to aspiration, the infant's position at death, when known, was on their tummy. DOES SLEEPING ON THE BACK CAUSE BABIES TO HAVE FLAT HEADS? There is some suggestion that the incidence of babies developing a flat spot on their heads may have increased since the incidence of sleeping on their tummies has decreased. Usually, this is not a serious condition. This condition will disappear within several months after the baby begins to sit up. Flat spots can be avoided by altering the head position when the baby is sleeping on his/her back. Giving your baby tummy time also helps prevent the development of a flat head. SHOULD PRODUCTS BE USED TO KEEP BABIES ON THEIR BACKS OR SIDES DURING SLEEP? Although various devices have been sold to maintain babies in a back-lying position during sleep, their use is not recommended. Infants who sleep on their backs need no extra support. SHOULD SOFT SURFACES BE AVOIDED? Several studies indicate that soft sleeping surfaces increase the risk of SIDS in infants. It is unknown how soft a surface must be to pose a threat. A firm infant-mattress with no more than a thin covering such as a sheet or rubberized pad between the infant and mattress is advised. Soft, plush, or bulky items, such as pillows, rolls of bedding, or cushions in the baby's sleeping environment are strongly warned against. These items can  come into close contact with the infant's face and might cause breathing problems.  DOES BED SHARING OR CO-SLEEPING DECREEASE RISK? No.Bed sharing, while controversial, is associated with an increased risk of SIDS, especially when the mother smokes, when sleeping occurs on a couch or sofa, when there are multiple bed sharers, or when bed sharers have consumed alcohol. Sleeping in an approved crib or bassinet in the same room as the mother decreases risk of SIDS. CAN A PACIFIER DECREASE RISK? While it is not known exactly how, pacifier use during the first year of life decreases the risk of SIDS. Give your baby the pacifier when putting the baby down, but do not force a pacifier or place one in your baby's mouth once your baby has fallen asleep. Pacifiers should not have any sugary solutions applied to them and need to be cleaned regularly. Finally, if your baby is breastfeeding, it is beneficial to delay use of a pacifier in order to firmly establish breastfeeding. Document Released: 07/27/2001  Document Revised: 10/25/2011 Document Reviewed: 03/03/2009 [facility] Patient Information 2014 [place], [place].

## 2013-12-31 ENCOUNTER — Encounter: Payer: Self-pay | Admitting: Obstetrics

## 2013-12-31 ENCOUNTER — Ambulatory Visit (INDEPENDENT_AMBULATORY_CARE_PROVIDER_SITE_OTHER): Payer: Medicaid Other | Admitting: Obstetrics

## 2013-12-31 VITALS — BP 123/85 | HR 82 | Temp 98.0°F | Wt 254.0 lb

## 2013-12-31 DIAGNOSIS — Z3400 Encounter for supervision of normal first pregnancy, unspecified trimester: Secondary | ICD-10-CM

## 2013-12-31 LAB — POCT URINALYSIS DIPSTICK
Glucose, UA: NEGATIVE
Ketones, UA: NEGATIVE
NITRITE UA: NEGATIVE
PH UA: 6
Protein, UA: NEGATIVE
RBC UA: NEGATIVE
SPEC GRAV UA: 1.015

## 2013-12-31 NOTE — Progress Notes (Signed)
Subjective:    Tracy Grant is a 28 y.o. female being seen today for her obstetrical visit. She is at [redacted]w[redacted]d gestation. Patient reports no complaints. Fetal movement: normal.  Problem List Items Addressed This Visit   Supervision of normal first pregnancy - Primary   Relevant Orders      POCT urinalysis dipstick (Completed)     Patient Active Problem List   Diagnosis Date Noted  . Supervision of normal first pregnancy 06/15/2013  . Migraine 08/08/2012    Objective:    BP 123/85  Pulse 82  Temp(Src) 98 F (36.7 C)  Wt 254 lb (115.214 kg)  LMP 03/16/2013 FHT: 140 BPM  Uterine Size: size equals dates  Presentations: unsure  Pelvic Exam:  Deferred   Assessment:    Pregnancy @ [redacted]w[redacted]d weeks   Plan:   Plans for delivery: Vaginal anticipated; labs reviewed; problem list updated Counseling: Consent signed. Infant feeding: plans to breastfeed. Cigarette smoking: Former smoker.  Quit found out + pregnancy test. L&D discussion: symptoms of labor, discussed when to call, discussed what number to call, anesthetic/analgesic options reviewed and delivering clinician:  plans Physician. Postpartum supports and preparation: circumcision discussed and contraception plans discussed.  Follow up in 1 Week.

## 2014-01-03 ENCOUNTER — Ambulatory Visit (INDEPENDENT_AMBULATORY_CARE_PROVIDER_SITE_OTHER): Payer: Medicaid Other | Admitting: Obstetrics

## 2014-01-03 ENCOUNTER — Encounter: Payer: Self-pay | Admitting: Obstetrics

## 2014-01-03 VITALS — BP 125/87 | HR 93 | Temp 98.5°F | Wt 255.0 lb

## 2014-01-03 DIAGNOSIS — Z3400 Encounter for supervision of normal first pregnancy, unspecified trimester: Secondary | ICD-10-CM

## 2014-01-03 LAB — POCT URINALYSIS DIPSTICK
GLUCOSE UA: NEGATIVE
KETONES UA: NEGATIVE
Leukocytes, UA: NEGATIVE
Nitrite, UA: NEGATIVE
Protein, UA: 1
RBC UA: NEGATIVE
SPEC GRAV UA: 1.02
pH, UA: 5

## 2014-01-03 NOTE — Progress Notes (Signed)
Subjective:    Tracy Grant is a 28 y.o. female being seen today for her obstetrical visit. She is at [redacted]w[redacted]d gestation. Patient reports no complaints. Fetal movement: normal.  Problem List Items Addressed This Visit   Supervision of normal first pregnancy - Primary   Relevant Orders      POCT urinalysis dipstick (Completed)     Patient Active Problem List   Diagnosis Date Noted  . Supervision of normal first pregnancy 06/15/2013  . Migraine 08/08/2012    Objective:    BP 125/87  Pulse 93  Temp(Src) 98.5 F (36.9 C)  Wt 255 lb (115.667 kg)  LMP 03/16/2013 FHT: 140 BPM  Uterine Size: size equals dates  Presentations: unsure  Pelvic Exam: Deferred        Assessment:    Pregnancy @ [redacted]w[redacted]d weeks   Plan:   Plans for delivery: Vaginal anticipated; labs reviewed; problem list updated Counseling: Consent signed. Infant feeding: plans to breastfeed. Cigarette smoking: Former smoker. L&D discussion: symptoms of labor, discussed when to call, discussed what number to call, anesthetic/analgesic options reviewed and delivering clinician:  plans Physician. Postpartum supports and preparation: circumcision discussed and contraception plans discussed.  Follow up in 1 Week.

## 2014-01-07 ENCOUNTER — Encounter (HOSPITAL_COMMUNITY): Payer: Self-pay | Admitting: General Practice

## 2014-01-07 ENCOUNTER — Inpatient Hospital Stay (HOSPITAL_COMMUNITY)
Admission: AD | Admit: 2014-01-07 | Discharge: 2014-01-07 | Disposition: A | Discharge status: Home / Self Care | Payer: Medicaid Other | Source: Ambulatory Visit | Attending: Obstetrics & Gynecology | Admitting: Obstetrics & Gynecology

## 2014-01-07 DIAGNOSIS — O99891 Other specified diseases and conditions complicating pregnancy: Secondary | ICD-10-CM | POA: Insufficient documentation

## 2014-01-07 DIAGNOSIS — O9989 Other specified diseases and conditions complicating pregnancy, childbirth and the puerperium: Principal | ICD-10-CM

## 2014-01-07 DIAGNOSIS — Z87891 Personal history of nicotine dependence: Secondary | ICD-10-CM | POA: Insufficient documentation

## 2014-01-07 DIAGNOSIS — R51 Headache: Secondary | ICD-10-CM | POA: Insufficient documentation

## 2014-01-07 DIAGNOSIS — R42 Dizziness and giddiness: Secondary | ICD-10-CM | POA: Insufficient documentation

## 2014-01-07 DIAGNOSIS — R030 Elevated blood-pressure reading, without diagnosis of hypertension: Secondary | ICD-10-CM | POA: Insufficient documentation

## 2014-01-07 DIAGNOSIS — IMO0001 Reserved for inherently not codable concepts without codable children: Secondary | ICD-10-CM

## 2014-01-07 DIAGNOSIS — M549 Dorsalgia, unspecified: Secondary | ICD-10-CM

## 2014-01-07 DIAGNOSIS — O26893 Other specified pregnancy related conditions, third trimester: Secondary | ICD-10-CM

## 2014-01-07 LAB — URINALYSIS, ROUTINE W REFLEX MICROSCOPIC
Bilirubin Urine: NEGATIVE
GLUCOSE, UA: NEGATIVE mg/dL
HGB URINE DIPSTICK: NEGATIVE
Ketones, ur: NEGATIVE mg/dL
Nitrite: NEGATIVE
PROTEIN: NEGATIVE mg/dL
Specific Gravity, Urine: 1.015 (ref 1.005–1.030)
Urobilinogen, UA: 0.2 mg/dL (ref 0.0–1.0)
pH: 6.5 (ref 5.0–8.0)

## 2014-01-07 LAB — CBC
HEMATOCRIT: 33.5 % — AB (ref 36.0–46.0)
HEMOGLOBIN: 11.6 g/dL — AB (ref 12.0–15.0)
MCH: 33.6 pg (ref 26.0–34.0)
MCHC: 34.6 g/dL (ref 30.0–36.0)
MCV: 97.1 fL (ref 78.0–100.0)
Platelets: 179 10*3/uL (ref 150–400)
RBC: 3.45 MIL/uL — ABNORMAL LOW (ref 3.87–5.11)
RDW: 13.3 % (ref 11.5–15.5)
WBC: 5.6 10*3/uL (ref 4.0–10.5)

## 2014-01-07 LAB — URINE MICROSCOPIC-ADD ON

## 2014-01-07 LAB — COMPREHENSIVE METABOLIC PANEL
ALK PHOS: 121 U/L — AB (ref 39–117)
ALT: 15 U/L (ref 0–35)
AST: 19 U/L (ref 0–37)
Albumin: 2.9 g/dL — ABNORMAL LOW (ref 3.5–5.2)
BILIRUBIN TOTAL: 0.4 mg/dL (ref 0.3–1.2)
BUN: 5 mg/dL — AB (ref 6–23)
CHLORIDE: 104 meq/L (ref 96–112)
CO2: 23 mEq/L (ref 19–32)
Calcium: 9.2 mg/dL (ref 8.4–10.5)
Creatinine, Ser: 0.65 mg/dL (ref 0.50–1.10)
Glucose, Bld: 124 mg/dL — ABNORMAL HIGH (ref 70–99)
Potassium: 4 mEq/L (ref 3.7–5.3)
Sodium: 140 mEq/L (ref 137–147)
Total Protein: 6.5 g/dL (ref 6.0–8.3)

## 2014-01-07 LAB — PROTEIN / CREATININE RATIO, URINE
CREATININE, URINE: 95.47 mg/dL
PROTEIN CREATININE RATIO: 0.12 (ref 0.00–0.15)
Total Protein, Urine: 11.1 mg/dL

## 2014-01-07 LAB — LACTATE DEHYDROGENASE: LDH: 211 U/L (ref 94–250)

## 2014-01-07 LAB — URIC ACID: URIC ACID, SERUM: 3.1 mg/dL (ref 2.4–7.0)

## 2014-01-07 MED ORDER — ACETAMINOPHEN 500 MG PO TABS
1000.0000 mg | ORAL_TABLET | Freq: Once | ORAL | Status: AC
  Administered 2014-01-07: 1000 mg via ORAL
  Filled 2014-01-07: qty 2

## 2014-01-07 NOTE — Discharge Instructions (Signed)
Back Pain in Pregnancy °Back pain during pregnancy is common. It happens in about half of all pregnancies. It is important for you and your baby that you remain active during your pregnancy. If you feel that back pain is not allowing you to remain active or sleep well, it is time to see your caregiver. Back pain may be caused by several factors related to changes during your pregnancy. Fortunately, unless you had trouble with your back before your pregnancy, the pain is likely to get better after you deliver. °Low back pain usually occurs between the fifth and seventh months of pregnancy. It can, however, happen in the first couple months. Factors that increase the risk of back problems include:  °· Previous back problems. °· Injury to your back. °· Having twins or multiple births. °· A chronic cough. °· Stress. °· Job-related repetitive motions. °· Muscle or spinal disease in the back. °· Family history of back problems, ruptured (herniated) discs, or osteoporosis. °· Depression, anxiety, and panic attacks. °CAUSES  °· When you are pregnant, your body produces a hormone called relaxin. This hormone makes the ligaments connecting the low back and pubic bones more flexible. This flexibility allows the baby to be delivered more easily. When your ligaments are loose, your muscles need to work harder to support your back. Soreness in your back can come from tired muscles. Soreness can also come from back tissues that are irritated since they are receiving less support. °· As the baby grows, it puts pressure on the nerves and blood vessels in your pelvis. This can cause back pain. °· As the baby grows and gets heavier during pregnancy, the uterus pushes the stomach muscles forward and changes your center of gravity. This makes your back muscles work harder to maintain good posture. °SYMPTOMS  °Lumbar pain during pregnancy °Lumbar pain during pregnancy usually occurs at or above the waist in the center of the back. There  may be pain and numbness that radiates into your leg or foot. This is similar to low back pain experienced by non-pregnant women. It usually increases with sitting for long periods of time, standing, or repetitive lifting. Tenderness may also be present in the muscles along your upper back. °Posterior pelvic pain during pregnancy °Pain in the back of the pelvis is more common than lumbar pain in pregnancy. It is a deep pain felt in your side at the waistline, or across the tailbone (sacrum), or in both places. You may have pain on one or both sides. This pain can also go into the buttocks and backs of the upper thighs. Pubic and groin pain may also be present. The pain does not quickly resolve with rest, and morning stiffness may also be present. °Pelvic pain during pregnancy can be brought on by most activities. A high level of fitness before and during pregnancy may or may not prevent this problem. Labor pain is usually 1 to 2 minutes apart, lasts for about 1 minute, and involves a bearing down feeling or pressure in your pelvis. However, if you are at term with the pregnancy, constant low back pain can be the beginning of early labor, and you should be aware of this. °DIAGNOSIS  °X-rays of the back should not be done during the first 12 to 14 weeks of the pregnancy and only when absolutely necessary during the rest of the pregnancy. MRIs do not give off radiation and are safe during pregnancy. MRIs also should only be done when absolutely necessary. °HOME CARE INSTRUCTIONS °· Exercise   as directed by your caregiver. Exercise is the most effective way to prevent or manage back pain. If you have a back problem, it is especially important to avoid sports that require sudden body movements. Swimming and walking are great activities. °· Do not stand in one place for long periods of time. °· Do not wear high heels. °· Sit in chairs with good posture. Use a pillow on your lower back if necessary. Make sure your head  rests over your shoulders and is not hanging forward. °· Try sleeping on your side, preferably the left side, with a pillow or two between your legs. If you are sore after a night's rest, your bed may be too soft. Try placing a board between your mattress and box spring. °· Listen to your body when lifting. If you are experiencing pain, ask for help or try bending your knees more so you can use your leg muscles rather than your back muscles. Squat down when picking up something from the floor. Do not bend over. °· Eat a healthy diet. Try to gain weight within your caregiver's recommendations. °· Use heat or cold packs 3 to 4 times a day for 15 minutes to help with the pain. °· Only take over-the-counter or prescription medicines for pain, discomfort, or fever as directed by your caregiver. °Sudden (acute) back pain °· Use bed rest for only the most extreme, acute episodes of back pain. Prolonged bed rest over 48 hours will aggravate your condition. °· Ice is very effective for acute conditions. °· Put ice in a plastic bag. °· Place a towel between your skin and the bag. °· Leave the ice on for 10 to 20 minutes every 2 hours, or as needed. °· Using heat packs for 30 minutes prior to activities is also helpful. °Continued back pain °See your caregiver if you have continued problems. Your caregiver can help or refer you for appropriate physical therapy. With conditioning, most back problems can be avoided. Sometimes, a more serious issue may be the cause of back pain. You should be seen right away if new problems seem to be developing. Your caregiver may recommend: °· A maternity girdle. °· An elastic sling. °· A back brace. °· A massage therapist or acupuncture. °SEEK MEDICAL CARE IF:  °· You are not able to do most of your daily activities, even when taking the pain medicine you were given. °· You need a referral to a physical therapist or chiropractor. °· You want to try acupuncture. °SEEK IMMEDIATE MEDICAL CARE  IF: °· You develop numbness, tingling, weakness, or problems with the use of your arms or legs. °· You develop severe back pain that is no longer relieved with medicines. °· You have a sudden change in bowel or bladder control. °· You have increasing pain in other areas of the body. °· You develop shortness of breath, dizziness, or fainting. °· You develop nausea, vomiting, or sweating. °· You have back pain which is similar to labor pains. °· You have back pain along with your water breaking or vaginal bleeding. °· You have back pain or numbness that travels down your leg. °· Your back pain developed after you fell. °· You develop pain on one side of your back. You may have a kidney stone. °· You see blood in your urine. You may have a bladder infection or kidney stone. °· You have back pain with blisters. You may have shingles. °Back pain is fairly common during pregnancy but should not be accepted as just part of   the process. Back pain should always be treated as soon as possible. This will make your pregnancy as pleasant as possible. Document Released: 11/10/2005 Document Revised: 10/25/2011 Document Reviewed: 12/22/2010 [facility] Patient Information 2014 [employer], [place]. Preeclampsia and Eclampsia Preeclampsia is a condition of high blood pressure during pregnancy. It can happen at 20 weeks or later in pregnancy. If high blood pressure occurs in the second half of pregnancy with no other symptoms, it is called gestational hypertension and goes away after the baby is born. If any of the symptoms listed below develop with gestational hypertension, it is then called preeclampsia. Eclampsia (convulsions) may follow preeclampsia. This is one of the reasons for regular prenatal checkups. Early diagnosis and treatment are very important to prevent eclampsia. CAUSES  There is no known cause of preeclampsia/eclampsia in pregnancy. There are several known conditions that may put the pregnant woman at risk, such  as:  The first pregnancy.  Having preeclampsia in a past pregnancy.  Having lasting (chronic) high blood pressure.  Having multiples (twins, triplets).  Being age 26 or older.  African American ethnic background.  Having kidney disease or diabetes.  Medical conditions such as lupus or blood diseases.  Being overweight (obese). SYMPTOMS   High blood pressure.  Headaches.  Sudden weight gain.  Swelling of hands, face, legs, and feet.  Protein in the urine.  Feeling sick to your stomach (nauseous) and throwing up (vomiting).  Vision problems (blurred or double vision).  Numbness in the face, arms, legs, and feet.  Dizziness.  Slurred speech.  Preeclampsia can cause growth retardation in the fetus.  Separation (abruption) of the placenta.  Not enough fluid in the amniotic sac (oligohydramnios).  Sensitivity to bright lights.  Belly (abdominal) pain. DIAGNOSIS  If protein is found in the urine in the second half of pregnancy, this is considered preeclampsia. Other symptoms mentioned above may also be present. TREATMENT  It is necessary to treat this.  Your caregiver may prescribe bed rest early in this condition. Plenty of rest and salt restriction may be all that is needed.  Medicines may be necessary to lower blood pressure if the condition does not respond to more conservative measures.  In more severe cases, hospitalization may be needed:  For treatment of blood pressure.  To control fluid retention.  To monitor the baby to see if the condition is causing harm to the baby.  Hospitalization is the best way to treat the first sign of preeclampsia. This is so the mother and baby can be watched closely and blood tests can be done effectively and correctly.  If the condition becomes severe, it may be necessary to induce labor or to remove the infant by surgical means (cesarean section). The best cure for preeclampsia/eclampsia is to deliver the  baby. Preeclampsia and eclampsia involve risks to mother and infant. Your caregiver will discuss these risks with you. Together, you can work out the best possible approach to your problems. Make sure you keep your prenatal visits as scheduled. Not keeping appointments could result in a chronic or permanent injury, pain, disability to you, and death or injury to you or your unborn baby. If there is any problem keeping the appointment, you must call to reschedule. HOME CARE INSTRUCTIONS   Keep your prenatal appointments and tests as scheduled.  Tell your caregiver if you have any of the above risk factors.  Get plenty of rest and sleep.  Eat a balanced diet that is low in salt, and do not  add salt to your food.  Avoid stressful situations.  Only take over-the-counter and prescriptions medicines for pain, discomfort, or fever as directed by your caregiver. SEEK IMMEDIATE MEDICAL CARE IF:   You develop severe swelling anywhere in the body. This usually occurs in the legs.  You gain 05 lb/2.3 kg or more in a week.  You develop a severe headache, dizziness, problems with your vision, or confusion.  You have abdominal pain, nausea, or vomiting.  You have a seizure.  You have trouble moving any part of your body, or you develop numbness or problems speaking.  You have bruising or abnormal bleeding from anywhere in the body.  You develop a stiff neck.  You pass out. MAKE SURE YOU:   Understand these instructions.  Will watch your condition.  Will get help right away if you are not doing well or get worse. Document Released: 07/30/2000 Document Revised: 10/25/2011 Document Reviewed: 03/15/2008 [facility] Patient Information 2014 [place], [place].

## 2014-01-07 NOTE — MAU Note (Signed)
States she started having headaches yesterday that come and go, mostly on R side of her head. States she feels slightly dizzy. C/O back pain X several weeks.

## 2014-01-07 NOTE — MAU Provider Note (Signed)
History     CSN: [id number]  Arrival date and time: 01/07/14 1657   First Provider Initiated Contact with Patient 01/07/14 1719      Chief Complaint  Patient presents with  . Headache  . Dizziness  . Back Pain   HPI [name] is a 28 y.o. G3P0020 at [redacted]w[redacted]d who presents to MAU today with complaint of headache and back pain. The patient states that she has had the headaches off and on all day. She denies blurred vision, floaters, RUQ pain today. She has had minimal LE edema occasionally. She states occasional mild contractions. She denies vaginal bleeding, discharge or LOF. She reports good fetal movement. She also endorses back pain x 3 weeks that is unchanged today.   OB History   Grav Para Term Preterm Abortions TAB SAB Ect Mult Living   3 0 0 0 2 0 2 0 0 0       Past Medical History  Diagnosis Date  . Hx of migraines   . Hyperlipidemia   . Allergy   . History of chicken pox   . Headache(784.0)   . Infection     trich    Past Surgical History  Procedure Laterality Date  . Adenoidectomy    . Dilation and curettage of uterus      Family History  Problem Relation Age of Onset  . Hypertension Mother   . Hypertension Father   . Hypertension Maternal Grandmother   . Hypertension Maternal Grandfather   . Hypertension Paternal Grandmother   . Hypertension Paternal Grandfather     History  Substance Use Topics  . Smoking status: Former Games developer  . Smokeless tobacco: Never Used     Comment: stopped with positive preg  test  . Alcohol Use: No    Allergies: No Known Allergies  Prescriptions prior to admission  Medication Sig Dispense Refill  . acetaminophen (TYLENOL) 500 MG tablet Take 1,000 mg by mouth every 6 (six) hours as needed for mild pain or headache.      Marland Kitchen acetaminophen-codeine (TYLENOL #3) 300-30 MG per tablet Take 1 tablet by mouth every 4 (four) hours as needed for moderate pain.      . butalbital-acetaminophen-caffeine (FIORICET, ESGIC)  50-325-40 MG per tablet Take 2 tablets by mouth every 6 (six) hours as needed for migraine.      . DiphenhydrAMINE HCl (BENADRYL PO) Take 2 tablets by mouth at bedtime as needed (sleep).      . witch hazel-glycerin (TUCKS) pad Apply 1 application topically as needed for itching, irritation or hemorrhoids.      . Prenat-FeCbn-FeAspGl-FA-Omega (OB COMPLETE PETITE) 35-5-1-200 MG CAPS Take 1 tablet by mouth daily.        Review of Systems  Constitutional: Negative for fever and malaise/fatigue.  Eyes: Negative for blurred vision and double vision.       Neg - floaters  Cardiovascular: Positive for leg swelling.  Gastrointestinal: Negative for nausea, vomiting, abdominal pain, diarrhea and constipation.  Genitourinary:       Neg - vaginal bleeding, discharge, LOF  Musculoskeletal: Positive for back pain.  Neurological: Positive for dizziness and headaches.   Physical Exam   Blood pressure 137/89, pulse 80, temperature 98 F (36.7 C), temperature source Oral, resp. rate 18, height 5\' 5"  (1.651 m), weight 255 lb (115.667 kg), last menstrual period 03/16/2013, SpO2 98.00%.  Physical Exam  Constitutional: She is oriented to person, place, and time. She appears well-developed and well-nourished. No distress.  HENT:  Head: Normocephalic and atraumatic.  Cardiovascular: Normal rate, regular rhythm and normal heart sounds.   Respiratory: Effort normal and breath sounds normal. No respiratory distress.  GI: Soft. Bowel sounds are normal. She exhibits no distension and no mass. There is no tenderness. There is no rebound and no guarding.  Musculoskeletal: She exhibits edema (1+ pitting edema to the ankle).  Neurological: She is alert and oriented to person, place, and time. She has normal reflexes.  No clonus  Skin: Skin is warm and dry. No erythema.  Psychiatric: She has a normal mood and affect.   Results for orders placed during the hospital encounter of 01/07/14 (from the past 24 hour(s))   URINALYSIS, ROUTINE W REFLEX MICROSCOPIC     Status: Abnormal   Collection Time    01/07/14  5:14 PM      Result Value Ref Range   Color, Urine YELLOW  YELLOW   APPearance CLEAR  CLEAR   Specific Gravity, Urine 1.015  1.005 - 1.030   pH 6.5  5.0 - 8.0   Glucose, UA NEGATIVE  NEGATIVE mg/dL   Hgb urine dipstick NEGATIVE  NEGATIVE   Bilirubin Urine NEGATIVE  NEGATIVE   Ketones, ur NEGATIVE  NEGATIVE mg/dL   Protein, ur NEGATIVE  NEGATIVE mg/dL   Urobilinogen, UA 0.2  0.0 - 1.0 mg/dL   Nitrite NEGATIVE  NEGATIVE   Leukocytes, UA TRACE (*) NEGATIVE  PROTEIN / CREATININE RATIO, URINE     Status: None   Collection Time    01/07/14  5:14 PM      Result Value Ref Range   Creatinine, Urine 95.47     Total Protein, Urine 11.1     PROTEIN CREATININE RATIO 0.12  0.00 - 0.15  URINE MICROSCOPIC-ADD ON     Status: Abnormal   Collection Time    01/07/14  5:14 PM      Result Value Ref Range   Squamous Epithelial / LPF MANY (*) RARE   WBC, UA 0-2  <3 WBC/hpf  CBC     Status: Abnormal   Collection Time    01/07/14  5:25 PM      Result Value Ref Range   WBC 5.6  4.0 - 10.5 K/uL   RBC 3.45 (*) 3.87 - 5.11 MIL/uL   Hemoglobin 11.6 (*) 12.0 - 15.0 g/dL   HCT 16.1 (*) 09.6 - 04.5 %   MCV 97.1  78.0 - 100.0 fL   MCH 33.6  26.0 - 34.0 pg   MCHC 34.6  30.0 - 36.0 g/dL   RDW 40.9  81.1 - 91.4 %   Platelets 179  150 - 400 K/uL  COMPREHENSIVE METABOLIC PANEL     Status: Abnormal   Collection Time    01/07/14  5:25 PM      Result Value Ref Range   Sodium 140  137 - 147 mEq/L   Potassium 4.0  3.7 - 5.3 mEq/L   Chloride 104  96 - 112 mEq/L   CO2 23  19 - 32 mEq/L   Glucose, Bld 124 (*) 70 - 99 mg/dL   BUN 5 (*) 6 - 23 mg/dL   Creatinine, Ser 7.82  0.50 - 1.10 mg/dL   Calcium 9.2  8.4 - 95.6 mg/dL   Total Protein 6.5  6.0 - 8.3 g/dL   Albumin 2.9 (*) 3.5 - 5.2 g/dL   AST 19  0 - 37 U/L   ALT 15  0 - 35 U/L   Alkaline Phosphatase  121 (*) 39 - 117 U/L   Total Bilirubin 0.4  0.3 - 1.2 mg/dL    GFR calc non Af Amer >90  >90 mL/min   GFR calc Af Amer >90  >90 mL/min  URIC ACID     Status: None   Collection Time    01/07/14  5:25 PM      Result Value Ref Range   Uric Acid, Serum 3.1  2.4 - 7.0 mg/dL  LACTATE DEHYDROGENASE     Status: None   Collection Time    01/07/14  5:25 PM      Result Value Ref Range   LDH 211  94 - 250 U/L    Fetal Monitoring: Baseline: 145 bpm, moderate variability, + accelerations, no decelerations Contractions: none  Patient Vitals for the past 24 hrs:  BP Temp Temp src Pulse Resp SpO2 Height Weight  01/07/14 1750 137/89 mmHg - - 80 - - - -  01/07/14 1735 141/85 mmHg - - 81 - - - -  01/07/14 1731 133/83 mmHg - - 84 - - - -  01/07/14 1723 134/82 mmHg - - 82 - - - -  01/07/14 1703 150/94 mmHg 98 F (36.7 C) Oral 84 18 98 % 5\' 5"  (1.651 m) 255 lb (115.667 kg)    MAU Course  Procedures None  MDM UA, Urine protein/creatinine ratio, CBC, CMP, Uric acid and LDH today Serial BPs Discussed with Dr. Tamela OddiJackson-Moore. Give Tylenol in MAU for headache. Follow-up in the office by Wednesday this week.  1000 mg Tylenol given. Patient rates headache at 5/10.  Assessment and Plan  A: Elevated BP in pregnancy Back pain in pregnancy, third trimester  P: Discharge home Patient advised to take Tylenol PRN for headache and back pain Discussed warning signs for pre-eclampsia Discussed labor precautions and kick counts Patient advised to call [facility] to change follow-up appointment to Tuesday or Wednesday of this week Patient may return to MAU as needed or if her condition were to change or worsen  Freddi StarrJulie N Ethier, PA-C  01/07/2014, 6:25 PM

## 2014-01-08 ENCOUNTER — Telehealth: Payer: Self-pay | Admitting: *Deleted

## 2014-01-08 NOTE — Telephone Encounter (Signed)
Patient called and stated that she was seem at MAU for elevated BP. She was told to follow up by Wednesday. Patient does have an appointment on Thursday. Attempted to call patient to offer sooner appointment and the number does not accept incoming calls. Message forwarded to Dr Clearance Coots.

## 2014-01-10 ENCOUNTER — Encounter: Payer: Self-pay | Admitting: Obstetrics

## 2014-01-10 ENCOUNTER — Ambulatory Visit (INDEPENDENT_AMBULATORY_CARE_PROVIDER_SITE_OTHER): Payer: Medicaid Other | Admitting: Obstetrics

## 2014-01-10 VITALS — BP 128/78 | HR 90 | Temp 98.4°F | Wt 257.0 lb

## 2014-01-10 DIAGNOSIS — Z3400 Encounter for supervision of normal first pregnancy, unspecified trimester: Secondary | ICD-10-CM

## 2014-01-10 NOTE — Progress Notes (Signed)
Subjective:    Tracy Grant is a 28 y.o. female being seen today for her obstetrical visit. She is at [redacted]w[redacted]d gestation. Patient reports no complaints. Fetal movement: normal.  Problem List Items Addressed This Visit   Supervision of normal first pregnancy - Primary   Relevant Orders      POCT urinalysis dipstick     Patient Active Problem List   Diagnosis Date Noted  . Supervision of normal first pregnancy 06/15/2013  . Migraine 08/08/2012    Objective:    BP 128/78  Pulse 90  Temp(Src) 98.4 F (36.9 C)  Wt 257 lb (116.574 kg)  LMP 03/16/2013 FHT: 140 BPM  Uterine Size: size greater than dates  Presentations: cephalic  Pelvic Exam:              Dilation: 1cm       Effacement: 50%             Station:  -3    Consistency: medium            Position: posterior     Assessment:    Pregnancy @ [redacted]w[redacted]d weeks   Plan:   Plans for delivery: Vaginal anticipated; labs reviewed; problem list updated Counseling: Consent signed. Infant feeding: plans to breastfeed. Cigarette smoking: quit 2014. L&D discussion: symptoms of labor, discussed when to call, discussed what number to call, anesthetic/analgesic options reviewed and delivering clinician:  plans Physician. Postpartum supports and preparation: circumcision discussed and contraception plans discussed.  Follow up in 1 Week.

## 2014-01-15 ENCOUNTER — Inpatient Hospital Stay (HOSPITAL_COMMUNITY)
Admission: AD | Admit: 2014-01-15 | Discharge: 2014-01-15 | Disposition: A | Discharge status: Home / Self Care | Payer: Medicaid Other | Source: Ambulatory Visit | Attending: Obstetrics & Gynecology | Admitting: Obstetrics & Gynecology

## 2014-01-15 ENCOUNTER — Encounter (HOSPITAL_COMMUNITY): Payer: Self-pay | Admitting: *Deleted

## 2014-01-15 ENCOUNTER — Telehealth: Payer: Self-pay | Admitting: *Deleted

## 2014-01-15 DIAGNOSIS — O479 False labor, unspecified: Secondary | ICD-10-CM | POA: Insufficient documentation

## 2014-01-15 DIAGNOSIS — Z3400 Encounter for supervision of normal first pregnancy, unspecified trimester: Secondary | ICD-10-CM

## 2014-01-15 DIAGNOSIS — O26859 Spotting complicating pregnancy, unspecified trimester: Secondary | ICD-10-CM | POA: Insufficient documentation

## 2014-01-15 NOTE — MAU Note (Signed)
Pt reports contractions and some spotting this pm.

## 2014-01-15 NOTE — Telephone Encounter (Signed)
Patient called and reported she was seen at MAU for contractions, but was sent home. Patient states she can't sleep and wants to know what to do.  Call to patient(12:00) patient states she is still having contractions and she is laying down- but keeps waking up. Reviewed labor symptoms and when to return to the hospital ie. Water breaks, contractions get stronger longer, bleeding, fetal movement decrease. Patient advised to rest while she can and she is under a heating blanket now.

## 2014-01-15 NOTE — Discharge Instructions (Signed)

## 2014-01-16 ENCOUNTER — Inpatient Hospital Stay (HOSPITAL_COMMUNITY): Payer: Medicaid Other

## 2014-01-16 ENCOUNTER — Encounter (HOSPITAL_COMMUNITY): Payer: Self-pay | Admitting: *Deleted

## 2014-01-16 ENCOUNTER — Encounter: Payer: Self-pay | Admitting: Obstetrics

## 2014-01-16 ENCOUNTER — Inpatient Hospital Stay (HOSPITAL_COMMUNITY)
Admission: AD | Admit: 2014-01-16 | Discharge: 2014-01-20 | DRG: 765 | Disposition: A | Discharge status: Home / Self Care | Payer: Medicaid Other | Source: Ambulatory Visit | Attending: Obstetrics | Admitting: Obstetrics

## 2014-01-16 ENCOUNTER — Ambulatory Visit (INDEPENDENT_AMBULATORY_CARE_PROVIDER_SITE_OTHER): Payer: Medicaid Other | Admitting: Obstetrics

## 2014-01-16 VITALS — BP 144/99 | HR 92 | Temp 96.6°F | Wt 249.0 lb

## 2014-01-16 DIAGNOSIS — O169 Unspecified maternal hypertension, unspecified trimester: Secondary | ICD-10-CM

## 2014-01-16 DIAGNOSIS — O99214 Obesity complicating childbirth: Secondary | ICD-10-CM

## 2014-01-16 DIAGNOSIS — Z3400 Encounter for supervision of normal first pregnancy, unspecified trimester: Secondary | ICD-10-CM

## 2014-01-16 DIAGNOSIS — O139 Gestational [pregnancy-induced] hypertension without significant proteinuria, unspecified trimester: Secondary | ICD-10-CM | POA: Diagnosis present

## 2014-01-16 DIAGNOSIS — O480 Post-term pregnancy: Secondary | ICD-10-CM

## 2014-01-16 DIAGNOSIS — O321XX0 Maternal care for breech presentation, not applicable or unspecified: Secondary | ICD-10-CM | POA: Diagnosis present

## 2014-01-16 DIAGNOSIS — Z6841 Body Mass Index (BMI) 40.0 and over, adult: Secondary | ICD-10-CM

## 2014-01-16 DIAGNOSIS — Z98891 History of uterine scar from previous surgery: Secondary | ICD-10-CM

## 2014-01-16 DIAGNOSIS — Z8249 Family history of ischemic heart disease and other diseases of the circulatory system: Secondary | ICD-10-CM

## 2014-01-16 DIAGNOSIS — E669 Obesity, unspecified: Secondary | ICD-10-CM | POA: Diagnosis present

## 2014-01-16 DIAGNOSIS — O411090 Infection of amniotic sac and membranes, unspecified, unspecified trimester, not applicable or unspecified: Secondary | ICD-10-CM | POA: Diagnosis present

## 2014-01-16 DIAGNOSIS — Z87891 Personal history of nicotine dependence: Secondary | ICD-10-CM

## 2014-01-16 LAB — COMPREHENSIVE METABOLIC PANEL
ALBUMIN: 3.2 g/dL — AB (ref 3.5–5.2)
ALT: 13 U/L (ref 0–35)
AST: 15 U/L (ref 0–37)
Alkaline Phosphatase: 144 U/L — ABNORMAL HIGH (ref 39–117)
BUN: 5 mg/dL — AB (ref 6–23)
CO2: 23 mEq/L (ref 19–32)
Calcium: 9.6 mg/dL (ref 8.4–10.5)
Chloride: 100 mEq/L (ref 96–112)
Creatinine, Ser: 0.78 mg/dL (ref 0.50–1.10)
GFR calc non Af Amer: >90 mL/min (ref >90)
Glucose, Bld: 81 mg/dL (ref 70–99)
Potassium: 3.9 mEq/L (ref 3.7–5.3)
SODIUM: 137 meq/L (ref 137–147)
TOTAL PROTEIN: 7 g/dL (ref 6.0–8.3)
Total Bilirubin: 0.6 mg/dL (ref 0.3–1.2)

## 2014-01-16 LAB — URINALYSIS, ROUTINE W REFLEX MICROSCOPIC
Bilirubin Urine: NEGATIVE
Glucose, UA: NEGATIVE mg/dL
Ketones, ur: >80 mg/dL — AB
Nitrite: NEGATIVE
PROTEIN: NEGATIVE mg/dL
SPECIFIC GRAVITY, URINE: 1.01 (ref 1.005–1.030)
UROBILINOGEN UA: 0.2 mg/dL (ref 0.0–1.0)
pH: 6 (ref 5.0–8.0)

## 2014-01-16 LAB — CBC
HCT: 35.6 % — ABNORMAL LOW (ref 36.0–46.0)
HCT: 34.4 % — ABNORMAL LOW (ref 36.0–46.0)
HEMOGLOBIN: 12.3 g/dL (ref 12.0–15.0)
Hemoglobin: 11.8 g/dL — ABNORMAL LOW (ref 12.0–15.0)
MCH: 33.7 pg (ref 26.0–34.0)
MCH: 33.3 pg (ref 26.0–34.0)
MCHC: 34.6 g/dL (ref 30.0–36.0)
MCHC: 34.3 g/dL (ref 30.0–36.0)
MCV: 97.5 fL (ref 78.0–100.0)
MCV: 97.2 fL (ref 78.0–100.0)
Platelets: 157 10*3/uL (ref 150–400)
Platelets: 155 K/uL (ref 150–400)
RBC: 3.65 MIL/uL — ABNORMAL LOW (ref 3.87–5.11)
RBC: 3.54 MIL/uL — ABNORMAL LOW (ref 3.87–5.11)
RDW: 13.3 % (ref 11.5–15.5)
RDW: 13.2 % (ref 11.5–15.5)
WBC: 7.9 10*3/uL (ref 4.0–10.5)
WBC: 8.1 K/uL (ref 4.0–10.5)

## 2014-01-16 LAB — POCT FERN TEST: POCT Fern Test: NEGATIVE

## 2014-01-16 LAB — POCT URINALYSIS DIPSTICK
Glucose, UA: NEGATIVE
Ketones, UA: 2
NITRITE UA: NEGATIVE
PH UA: 6
RBC UA: 250
Spec Grav, UA: <=1.005

## 2014-01-16 LAB — PROTEIN / CREATININE RATIO, URINE
CREATININE, URINE: 110.01 mg/dL
Protein Creatinine Ratio: 0.14 (ref 0.00–0.15)
Total Protein, Urine: 15.6 mg/dL

## 2014-01-16 LAB — LACTATE DEHYDROGENASE: LDH: 190 U/L (ref 94–250)

## 2014-01-16 LAB — TYPE AND SCREEN
ABO/RH(D): O POS
Antibody Screen: NEGATIVE

## 2014-01-16 LAB — URIC ACID: Uric Acid, Serum: 3.9 mg/dL (ref 2.4–7.0)

## 2014-01-16 LAB — URINE MICROSCOPIC-ADD ON

## 2014-01-16 MED ORDER — OXYTOCIN BOLUS FROM INFUSION: 500.0000 mL | INTRAVENOUS | Status: DC

## 2014-01-16 MED ORDER — NALBUPHINE HCL 10 MG/ML IJ SOLN: 10.0000 mg | Freq: Four times a day (QID) | INTRAMUSCULAR | Status: DC | PRN

## 2014-01-16 MED ORDER — LACTATED RINGERS IV SOLN
INTRAVENOUS | Status: DC
  Administered 2014-01-16 – 2014-01-17 (×2): via INTRAVENOUS

## 2014-01-16 MED ORDER — TERBUTALINE SULFATE 1 MG/ML IJ SOLN: 0.2500 mg | Freq: Once | INTRAMUSCULAR | Status: AC | PRN

## 2014-01-16 MED ORDER — MISOPROSTOL 25 MCG QUARTER TABLET
25.0000 ug | ORAL_TABLET | ORAL | Status: DC | PRN
  Administered 2014-01-16: 25 ug via VAGINAL
  Filled 2014-01-16: qty 0.25

## 2014-01-16 MED ORDER — LIDOCAINE HCL (PF) 1 % IJ SOLN: 30.0000 mL | INTRAMUSCULAR | Status: DC | PRN

## 2014-01-16 MED ORDER — ZOLPIDEM TARTRATE 5 MG PO TABS
5.0000 mg | ORAL_TABLET | Freq: Every evening | ORAL | Status: DC | PRN
  Administered 2014-01-16: 5 mg via ORAL
  Filled 2014-01-16: qty 1

## 2014-01-16 MED ORDER — OXYTOCIN 40 UNITS IN LACTATED RINGERS INFUSION - SIMPLE MED: 62.5000 mL/h | INTRAVENOUS | Status: DC

## 2014-01-16 MED ORDER — NALBUPHINE HCL 10 MG/ML IJ SOLN
10.0000 mg | INTRAMUSCULAR | Status: DC | PRN
  Administered 2014-01-16 – 2014-01-17 (×3): 10 mg via INTRAVENOUS
  Filled 2014-01-16 (×3): qty 1

## 2014-01-16 MED ORDER — ONDANSETRON HCL 4 MG/2ML IJ SOLN: 4.0000 mg | Freq: Four times a day (QID) | INTRAMUSCULAR | Status: DC | PRN

## 2014-01-16 MED ORDER — IBUPROFEN 600 MG PO TABS: 600.0000 mg | ORAL_TABLET | Freq: Four times a day (QID) | ORAL | Status: DC | PRN

## 2014-01-16 MED ORDER — LACTATED RINGERS IV SOLN: 500.0000 mL | INTRAVENOUS | Status: DC | PRN

## 2014-01-16 MED ORDER — ACETAMINOPHEN 325 MG PO TABS: 650.0000 mg | ORAL_TABLET | ORAL | Status: DC | PRN

## 2014-01-16 MED ORDER — CITRIC ACID-SODIUM CITRATE 334-500 MG/5ML PO SOLN
30.0000 mL | ORAL | Status: DC | PRN
  Administered 2014-01-17: 30 mL via ORAL
  Filled 2014-01-16: qty 15

## 2014-01-16 MED ORDER — PROMETHAZINE HCL 25 MG/ML IJ SOLN: 25.0000 mg | Freq: Four times a day (QID) | INTRAMUSCULAR | Status: DC | PRN

## 2014-01-16 MED ORDER — OXYCODONE-ACETAMINOPHEN 5-325 MG PO TABS: 1.0000 | ORAL_TABLET | ORAL | Status: DC | PRN

## 2014-01-16 NOTE — Progress Notes (Signed)
Subjective:    Tracy Grant is a 28 y.o. female being seen today for her obstetrical visit. She is at [redacted]w[redacted]d gestation. Patient reports no complaints. Fetal movement: normal.  Problem List Items Addressed This Visit   Supervision of normal first pregnancy   Relevant Orders      POCT urinalysis dipstick (Completed)    Other Visit Diagnoses   Post-dates pregnancy    -  Primary    Relevant Orders       Fetal non-stress test      Patient Active Problem List   Diagnosis Date Noted  . Supervision of normal first pregnancy 06/15/2013  . Migraine 08/08/2012    Objective:    BP 144/99  Pulse 92  Temp(Src) 96.6 F (35.9 C)  Wt 249 lb (112.946 kg)  LMP 03/16/2013 FHT:  140 BPM  Uterine Size: size equals dates  Presentation: Not examined  Pelvic Exam: Deferred    Assessment:    Pregnancy @ [redacted]w[redacted]d  weeks   Elevated BP.  Plan:     Sent to [facility] for [facility] evaluation.

## 2014-01-16 NOTE — MAU Note (Signed)
Pt was sent from MD's office with elevated BP. Pt denies h/a at this time, pt had a h/a yesterday that was relieved w/ T3. Pt reports uc's q 8 min. Pt states that she has been leaking some fluid since this morning. Not enough to wear a pad

## 2014-01-16 NOTE — MAU Provider Note (Signed)
History     CSN: [id number]  Arrival date and time: 01/16/14 1057   First Provider Initiated Contact with Patient 01/16/14 1202      No chief complaint on file.  HPI [name] is a 28 y.o. G3P0020 at [redacted]w[redacted]d who presents to MAU today from the office for further evaluation of elevated BP. The patient denies headache, blurred vision, floaters, RUQ pain today. She states irregular contractions with pain rated at 8/10 with contractions only. She states a small amount of brown mucus discharge today. She also endorses possible LOF. She reports good fetal movement.   OB History   Grav Para Term Preterm Abortions TAB SAB Ect Mult Living   3 0 0 0 2 0 2 0 0 0       Past Medical History  Diagnosis Date  . Hx of migraines   . Hyperlipidemia   . Allergy   . History of chicken pox   . Headache(784.0)   . Infection     trich    Past Surgical History  Procedure Laterality Date  . Adenoidectomy    . Dilation and curettage of uterus      Family History  Problem Relation Age of Onset  . Hypertension Mother   . Hypertension Father   . Hypertension Maternal Grandmother   . Hypertension Maternal Grandfather   . Hypertension Paternal Grandmother   . Hypertension Paternal Grandfather     History  Substance Use Topics  . Smoking status: Former Games developer  . Smokeless tobacco: Never Used     Comment: stopped with positive preg  test  . Alcohol Use: No    Allergies: No Known Allergies  Prescriptions prior to admission  Medication Sig Dispense Refill  . acetaminophen (TYLENOL) 500 MG tablet Take 1,000 mg by mouth every 6 (six) hours as needed for mild pain or headache.      Marland Kitchen acetaminophen-codeine (TYLENOL #3) 300-30 MG per tablet Take 1 tablet by mouth every 4 (four) hours as needed for moderate pain.      . butalbital-acetaminophen-caffeine (FIORICET, ESGIC) 50-325-40 MG per tablet Take 1-2 tablets by mouth every 6 (six) hours as needed for migraine. 1 to 2 tablets depends  on pain level      . DiphenhydrAMINE HCl (BENADRYL PO) Take 2 tablets by mouth at bedtime as needed (sleep).      . Prenat-FeCbn-FeAspGl-FA-Omega (OB COMPLETE PETITE) 35-5-1-200 MG CAPS Take 1 tablet by mouth daily.        Review of Systems  Constitutional: Negative for fever and malaise/fatigue.  Eyes: Negative for blurred vision and double vision.  Gastrointestinal: Negative for nausea, vomiting and abdominal pain.  Genitourinary:       + vaginal bleeding, discharge, ?LOF  Neurological: Negative for headaches.   Physical Exam   Blood pressure 152/94, pulse 83, temperature 98.7 F (37.1 C), last menstrual period 03/16/2013.  Physical Exam  Constitutional: She is oriented to person, place, and time. She appears well-developed and well-nourished. No distress.  HENT:  Head: Normocephalic and atraumatic.  Cardiovascular: Normal rate.   Respiratory: Effort normal.  GI: Soft. She exhibits no distension. There is no tenderness. There is no rebound.  Genitourinary: There is bleeding (scant light brown) around the vagina. Vaginal discharge (scant thin, brown discharge noted) found.  No pooling  Musculoskeletal: She exhibits no edema.  Neurological: She is alert and oriented to person, place, and time. She has normal reflexes.  No clonus  Skin: Skin is warm and dry. No  erythema.  Psychiatric: She has a normal mood and affect.  Dilation: 1 Effacement (%): Thick Cervical Position: Middle Station: -3 Exam by:: B Mosca   Patient Vitals for the past 24 hrs:  BP Temp Pulse  01/16/14 1316 152/94 mmHg - 83  01/16/14 1259 156/99 mmHg - 87  01/16/14 1222 150/95 mmHg - 90  01/16/14 1212 146/91 mmHg - 85  01/16/14 1202 142/92 mmHg - 93  01/16/14 1152 151/97 mmHg - 85  01/16/14 1142 151/98 mmHg - 84  01/16/14 1141 - 98.7 F (37.1 C) -  01/16/14 1132 161/97 mmHg - 92   Results for orders placed during the hospital encounter of 01/16/14 (from the past 24 hour(s))  URINALYSIS, ROUTINE W  REFLEX MICROSCOPIC     Status: Abnormal   Collection Time    01/16/14 11:15 AM      Result Value Ref Range   Color, Urine YELLOW  YELLOW   APPearance CLEAR  CLEAR   Specific Gravity, Urine 1.010  1.005 - 1.030   pH 6.0  5.0 - 8.0   Glucose, UA NEGATIVE  NEGATIVE mg/dL   Hgb urine dipstick MODERATE (*) NEGATIVE   Bilirubin Urine NEGATIVE  NEGATIVE   Ketones, ur >80 (*) NEGATIVE mg/dL   Protein, ur NEGATIVE  NEGATIVE mg/dL   Urobilinogen, UA 0.2  0.0 - 1.0 mg/dL   Nitrite NEGATIVE  NEGATIVE   Leukocytes, UA MODERATE (*) NEGATIVE  PROTEIN / CREATININE RATIO, URINE     Status: None   Collection Time    01/16/14 11:15 AM      Result Value Ref Range   Creatinine, Urine 110.01     Total Protein, Urine 15.6     PROTEIN CREATININE RATIO 0.14  0.00 - 0.15  URINE MICROSCOPIC-ADD ON     Status: None   Collection Time    01/16/14 11:15 AM      Result Value Ref Range   Squamous Epithelial / LPF RARE  RARE   WBC, UA 7-10  <3 WBC/hpf   Bacteria, UA RARE  RARE  CBC     Status: Abnormal   Collection Time    01/16/14 11:35 AM      Result Value Ref Range   WBC 7.9  4.0 - 10.5 K/uL   RBC 3.65 (*) 3.87 - 5.11 MIL/uL   Hemoglobin 12.3  12.0 - 15.0 g/dL   HCT 78.235.6 (*) 95.636.0 - 21.346.0 %   MCV 97.5  78.0 - 100.0 fL   MCH 33.7  26.0 - 34.0 pg   MCHC 34.6  30.0 - 36.0 g/dL   RDW 08.613.3  57.811.5 - 46.915.5 %   Platelets 157  150 - 400 K/uL  COMPREHENSIVE METABOLIC PANEL     Status: Abnormal   Collection Time    01/16/14 11:35 AM      Result Value Ref Range   Sodium 137  137 - 147 mEq/L   Potassium 3.9  3.7 - 5.3 mEq/L   Chloride 100  96 - 112 mEq/L   CO2 23  19 - 32 mEq/L   Glucose, Bld 81  70 - 99 mg/dL   BUN 5 (*) 6 - 23 mg/dL   Creatinine, Ser 6.290.78  0.50 - 1.10 mg/dL   Calcium 9.6  8.4 - 52.810.5 mg/dL   Total Protein 7.0  6.0 - 8.3 g/dL   Albumin 3.2 (*) 3.5 - 5.2 g/dL   AST 15  0 - 37 U/L   ALT 13  0 -  35 U/L   Alkaline Phosphatase 144 (*) 39 - 117 U/L   Total Bilirubin 0.6  0.3 - 1.2 mg/dL   GFR  calc non Af Amer >90  >90 mL/min   GFR calc Af Amer >90  >90 mL/min  URIC ACID     Status: None   Collection Time    01/16/14 11:35 AM      Result Value Ref Range   Uric Acid, Serum 3.9  2.4 - 7.0 mg/dL  LACTATE DEHYDROGENASE     Status: None   Collection Time    01/16/14 11:35 AM      Result Value Ref Range   LDH 190  94 - 250 U/L  POCT FERN TEST     Status: None   Collection Time    01/16/14 12:20 PM      Result Value Ref Range   POCT Fern Test Negative = intact amniotic membranes      Fetal Monitoring: Baseline: 145 bpm, moderate variability, + accelerations, no decelerations Contractions: occasional, irregular  MAU Course  Procedures None  MDM CBC, CMP, Uric Acid, LDH, UA and protein/creatinine ratio ordered Discussed patient and lab results with Dr. Clearance Coots. Admit to L&D for induction. He will put in orders.  Assessment and Plan  A: Hypertension in pregnancy, antepartum  P: Admit to L&D for induction per Dr. Dessa Phi, PA-C  01/16/2014, 2:39 PM

## 2014-01-16 NOTE — H&P (Signed)
Tracy Grant is a 28 y.o. female presenting for elevated BP in office. Maternal Medical History:  Fetal activity: Perceived fetal activity is normal.   Last perceived fetal movement was within the past hour.    Prenatal complications: no prenatal complications Prenatal Complications - Diabetes: none.    OB History   Grav Para Term Preterm Abortions TAB SAB Ect Mult Living   3 0 0 0 2 0 2 0 0 0      Past Medical History  Diagnosis Date  . Hx of migraines   . Hyperlipidemia   . Allergy   . History of chicken pox   . Headache(784.0)   . Infection     trich   Past Surgical History  Procedure Laterality Date  . Adenoidectomy    . Dilation and curettage of uterus     Family History: family history includes Hypertension in her father, maternal grandfather, maternal grandmother, mother, paternal grandfather, and paternal grandmother. Social History:  reports that she has quit smoking. She has never used smokeless tobacco. She reports that she does not drink alcohol or use illicit drugs.   Prenatal Transfer Tool  Maternal Diabetes: No Genetic Screening: Normal Maternal Ultrasounds/Referrals: Normal Fetal Ultrasounds or other Referrals:  None Maternal Substance Abuse:  No Significant Maternal Medications:  None Significant Maternal Lab Results:  Lab values include: Group B Strep negative Other Comments:  None  Review of Systems  All other systems reviewed and are negative.   Dilation: 1 Effacement (%): Thick Station: -3 Exam by:: B Mosca Blood pressure 152/94, pulse 83, temperature 98.7 F (37.1 C), last menstrual period 03/16/2013. Maternal Exam:  Abdomen: Patient reports no abdominal tenderness. Fetal presentation: vertex  Introitus: Normal vulva. Normal vagina.  Pelvis: adequate for delivery.   Cervix: Cervix evaluated by digital exam.     Physical Exam  Nursing note and vitals reviewed. Constitutional: She is oriented to person, place, and time. She  appears well-developed and well-nourished.  HENT:  Head: Normocephalic and atraumatic.  Eyes: Conjunctivae are normal. Pupils are equal, round, and reactive to light.  Neck: Normal range of motion. Neck supple.  Cardiovascular: Normal rate and regular rhythm.   Respiratory: Effort normal and breath sounds normal.  GI: Soft.  Genitourinary: Vagina normal and uterus normal.  Musculoskeletal: Normal range of motion.  Neurological: She is alert and oriented to person, place, and time.  Skin: Skin is warm and dry.  Psychiatric: She has a normal mood and affect. Her behavior is normal. Judgment and thought content normal.    Prenatal labs: ABO, Rh: O/POS/-- (10/31 1745) Antibody: NEG (10/31 1745) Rubella: 4.93 (10/31 1745) RPR: NON REAC (03/04 1156)  HBsAg: NEGATIVE (10/31 1745)  HIV: NON REACTIVE (03/04 1156)  GBS: NEGATIVE (04/30 1052)   Assessment/Plan: 40.1 weeks.  PIH.  Admit.  2 stage IOL.   Brock Bad 01/16/2014, 2:50 PM

## 2014-01-17 ENCOUNTER — Encounter (HOSPITAL_COMMUNITY): Payer: Self-pay | Admitting: *Deleted

## 2014-01-17 ENCOUNTER — Encounter (HOSPITAL_COMMUNITY): Payer: Medicaid Other | Admitting: Anesthesiology

## 2014-01-17 ENCOUNTER — Inpatient Hospital Stay (HOSPITAL_COMMUNITY): Payer: Medicaid Other | Admitting: Anesthesiology

## 2014-01-17 ENCOUNTER — Encounter (HOSPITAL_COMMUNITY)
Admission: AD | Disposition: A | Discharge status: Home / Self Care | Payer: Self-pay | Source: Ambulatory Visit | Attending: Obstetrics

## 2014-01-17 DIAGNOSIS — O321XX0 Maternal care for breech presentation, not applicable or unspecified: Secondary | ICD-10-CM

## 2014-01-17 LAB — CBC
HCT: 34.7 % — ABNORMAL LOW (ref 36.0–46.0)
HEMOGLOBIN: 11.9 g/dL — AB (ref 12.0–15.0)
MCH: 33.3 pg (ref 26.0–34.0)
MCHC: 34.3 g/dL (ref 30.0–36.0)
MCV: 97.2 fL (ref 78.0–100.0)
Platelets: 142 10*3/uL — ABNORMAL LOW (ref 150–400)
RBC: 3.57 MIL/uL — ABNORMAL LOW (ref 3.87–5.11)
RDW: 13.3 % (ref 11.5–15.5)
WBC: 7.8 10*3/uL (ref 4.0–10.5)

## 2014-01-17 LAB — HIV ANTIBODY (ROUTINE TESTING W REFLEX): HIV: NONREACTIVE

## 2014-01-17 LAB — RPR

## 2014-01-17 SURGERY — Surgical Case
Anesthesia: Spinal | Site: Abdomen

## 2014-01-17 MED ORDER — PHENYLEPHRINE HCL 10 MG/ML IJ SOLN
INTRAMUSCULAR | Status: AC
  Filled 2014-01-17: qty 1

## 2014-01-17 MED ORDER — METOCLOPRAMIDE HCL 5 MG/ML IJ SOLN: 10.0000 mg | Freq: Three times a day (TID) | INTRAMUSCULAR | Status: DC | PRN

## 2014-01-17 MED ORDER — SENNOSIDES-DOCUSATE SODIUM 8.6-50 MG PO TABS
2.0000 | ORAL_TABLET | ORAL | Status: DC
  Administered 2014-01-17 – 2014-01-19 (×3): 2 via ORAL
  Filled 2014-01-17 (×3): qty 2

## 2014-01-17 MED ORDER — KETOROLAC TROMETHAMINE 60 MG/2ML IM SOLN
60.0000 mg | Freq: Once | INTRAMUSCULAR | Status: AC | PRN
  Administered 2014-01-17: 60 mg via INTRAMUSCULAR

## 2014-01-17 MED ORDER — ONDANSETRON HCL 4 MG/2ML IJ SOLN
INTRAMUSCULAR | Status: AC
  Filled 2014-01-17: qty 2

## 2014-01-17 MED ORDER — SODIUM CHLORIDE 0.9 % IJ SOLN: 3.0000 mL | INTRAMUSCULAR | Status: DC | PRN

## 2014-01-17 MED ORDER — IBUPROFEN 600 MG PO TABS
600.0000 mg | ORAL_TABLET | Freq: Four times a day (QID) | ORAL | Status: DC
  Administered 2014-01-17 – 2014-01-20 (×11): 600 mg via ORAL
  Filled 2014-01-17 (×12): qty 1

## 2014-01-17 MED ORDER — DIPHENHYDRAMINE HCL 50 MG/ML IJ SOLN: 12.5000 mg | INTRAMUSCULAR | Status: DC | PRN

## 2014-01-17 MED ORDER — LACTATED RINGERS IV SOLN
INTRAVENOUS | Status: DC | PRN
  Administered 2014-01-17: 06:00:00 via INTRAVENOUS

## 2014-01-17 MED ORDER — SCOPOLAMINE 1 MG/3DAYS TD PT72
1.0000 | MEDICATED_PATCH | Freq: Once | TRANSDERMAL | Status: AC
  Administered 2014-01-17: 1.5 mg via TRANSDERMAL

## 2014-01-17 MED ORDER — NALOXONE HCL 0.4 MG/ML IJ SOLN: 0.4000 mg | INTRAMUSCULAR | Status: DC | PRN

## 2014-01-17 MED ORDER — DIBUCAINE 1 % RE OINT: 1.0000 "application " | TOPICAL_OINTMENT | RECTAL | Status: DC | PRN

## 2014-01-17 MED ORDER — OXYTOCIN 40 UNITS IN LACTATED RINGERS INFUSION - SIMPLE MED: 62.5000 mL/h | INTRAVENOUS | Status: AC

## 2014-01-17 MED ORDER — LACTATED RINGERS IV SOLN: 500.0000 mL | Freq: Once | INTRAVENOUS | Status: DC

## 2014-01-17 MED ORDER — FENTANYL CITRATE 0.05 MG/ML IJ SOLN
INTRAMUSCULAR | Status: DC | PRN
  Administered 2014-01-17: 15 ug via INTRATHECAL

## 2014-01-17 MED ORDER — CEFAZOLIN SODIUM-DEXTROSE 2-3 GM-% IV SOLR
INTRAVENOUS | Status: DC | PRN
  Administered 2014-01-17: 2 g via INTRAVENOUS

## 2014-01-17 MED ORDER — ONDANSETRON HCL 4 MG/2ML IJ SOLN
4.0000 mg | INTRAMUSCULAR | Status: DC | PRN
Start: 2014-01-17 — End: 2014-01-20

## 2014-01-17 MED ORDER — LACTATED RINGERS IV SOLN
INTRAVENOUS | Status: DC
  Administered 2014-01-18: via INTRAVENOUS

## 2014-01-17 MED ORDER — NALBUPHINE HCL 10 MG/ML IJ SOLN: 5.0000 mg | INTRAMUSCULAR | Status: DC | PRN

## 2014-01-17 MED ORDER — OXYTOCIN 10 UNIT/ML IJ SOLN
INTRAMUSCULAR | Status: AC
  Filled 2014-01-17: qty 4

## 2014-01-17 MED ORDER — SIMETHICONE 80 MG PO CHEW
80.0000 mg | CHEWABLE_TABLET | ORAL | Status: DC
  Administered 2014-01-17 – 2014-01-19 (×3): 80 mg via ORAL
  Filled 2014-01-17 (×3): qty 1

## 2014-01-17 MED ORDER — OXYTOCIN 10 UNIT/ML IJ SOLN
40.0000 [IU] | INTRAVENOUS | Status: DC | PRN
  Administered 2014-01-17: 40 [IU] via INTRAVENOUS

## 2014-01-17 MED ORDER — MENTHOL 3 MG MT LOZG: 1.0000 | LOZENGE | OROMUCOSAL | Status: DC | PRN

## 2014-01-17 MED ORDER — DIPHENHYDRAMINE HCL 25 MG PO CAPS: 25.0000 mg | ORAL_CAPSULE | ORAL | Status: DC | PRN

## 2014-01-17 MED ORDER — MORPHINE SULFATE 0.5 MG/ML IJ SOLN
INTRAMUSCULAR | Status: AC
  Filled 2014-01-17: qty 10

## 2014-01-17 MED ORDER — FENTANYL 2.5 MCG/ML BUPIVACAINE 1/10 % EPIDURAL INFUSION (WH - ANES)
INTRAMUSCULAR | Status: AC
  Filled 2014-01-17: qty 125

## 2014-01-17 MED ORDER — BUPIVACAINE IN DEXTROSE 0.75-8.25 % IT SOLN
INTRATHECAL | Status: DC | PRN
  Administered 2014-01-17: 1.5 mg via INTRATHECAL

## 2014-01-17 MED ORDER — KETOROLAC TROMETHAMINE 60 MG/2ML IM SOLN
INTRAMUSCULAR | Status: AC
  Administered 2014-01-17: 60 mg via INTRAMUSCULAR
  Filled 2014-01-17: qty 2

## 2014-01-17 MED ORDER — EPHEDRINE 5 MG/ML INJ
INTRAVENOUS | Status: AC
  Filled 2014-01-17: qty 4

## 2014-01-17 MED ORDER — MORPHINE SULFATE (PF) 0.5 MG/ML IJ SOLN
INTRAMUSCULAR | Status: DC | PRN
  Administered 2014-01-17: .1 mg via INTRATHECAL

## 2014-01-17 MED ORDER — TETANUS-DIPHTH-ACELL PERTUSSIS 5-2.5-18.5 LF-MCG/0.5 IM SUSP
0.5000 mL | Freq: Once | INTRAMUSCULAR | Status: AC
  Administered 2014-01-17: 0.5 mL via INTRAMUSCULAR
  Filled 2014-01-17: qty 0.5

## 2014-01-17 MED ORDER — ZOLPIDEM TARTRATE 5 MG PO TABS: 5.0000 mg | ORAL_TABLET | Freq: Every evening | ORAL | Status: DC | PRN

## 2014-01-17 MED ORDER — DIPHENHYDRAMINE HCL 25 MG PO CAPS: 25.0000 mg | ORAL_CAPSULE | Freq: Four times a day (QID) | ORAL | Status: DC | PRN

## 2014-01-17 MED ORDER — KETOROLAC TROMETHAMINE 30 MG/ML IJ SOLN
30.0000 mg | Freq: Four times a day (QID) | INTRAMUSCULAR | Status: AC | PRN
  Administered 2014-01-17: 30 mg via INTRAVENOUS
  Filled 2014-01-17: qty 1

## 2014-01-17 MED ORDER — LANOLIN HYDROUS EX OINT: 1.0000 "application " | TOPICAL_OINTMENT | CUTANEOUS | Status: DC | PRN

## 2014-01-17 MED ORDER — CEFAZOLIN SODIUM-DEXTROSE 2-3 GM-% IV SOLR
INTRAVENOUS | Status: AC
  Filled 2014-01-17: qty 50

## 2014-01-17 MED ORDER — DEXTROSE 5 % IV SOLN
1.0000 ug/kg/h | INTRAVENOUS | Status: DC | PRN
  Filled 2014-01-17: qty 2

## 2014-01-17 MED ORDER — PHENYLEPHRINE 40 MCG/ML (10ML) SYRINGE FOR IV PUSH (FOR BLOOD PRESSURE SUPPORT): 80.0000 ug | PREFILLED_SYRINGE | INTRAVENOUS | Status: DC | PRN

## 2014-01-17 MED ORDER — FENTANYL CITRATE 0.05 MG/ML IJ SOLN: 25.0000 ug | INTRAMUSCULAR | Status: DC | PRN

## 2014-01-17 MED ORDER — FENTANYL CITRATE 0.05 MG/ML IJ SOLN
INTRAMUSCULAR | Status: AC
  Filled 2014-01-17: qty 2

## 2014-01-17 MED ORDER — SIMETHICONE 80 MG PO CHEW
80.0000 mg | CHEWABLE_TABLET | Freq: Three times a day (TID) | ORAL | Status: DC
  Administered 2014-01-17 – 2014-01-20 (×8): 80 mg via ORAL
  Filled 2014-01-17 (×8): qty 1

## 2014-01-17 MED ORDER — MEDROXYPROGESTERONE ACETATE 150 MG/ML IM SUSP: 150.0000 mg | INTRAMUSCULAR | Status: DC | PRN

## 2014-01-17 MED ORDER — WITCH HAZEL-GLYCERIN EX PADS: 1.0000 "application " | MEDICATED_PAD | CUTANEOUS | Status: DC | PRN

## 2014-01-17 MED ORDER — ONDANSETRON HCL 4 MG/2ML IJ SOLN: 4.0000 mg | Freq: Three times a day (TID) | INTRAMUSCULAR | Status: DC | PRN

## 2014-01-17 MED ORDER — FENTANYL 2.5 MCG/ML BUPIVACAINE 1/10 % EPIDURAL INFUSION (WH - ANES): 14.0000 mL/h | INTRAMUSCULAR | Status: DC | PRN

## 2014-01-17 MED ORDER — MEPERIDINE HCL 25 MG/ML IJ SOLN: 6.2500 mg | INTRAMUSCULAR | Status: DC | PRN

## 2014-01-17 MED ORDER — PHENYLEPHRINE 8 MG IN D5W 100 ML (0.08MG/ML) PREMIX OPTIME
INJECTION | INTRAVENOUS | Status: DC | PRN
  Administered 2014-01-17: 60 ug/min via INTRAVENOUS

## 2014-01-17 MED ORDER — EPHEDRINE 5 MG/ML INJ
10.0000 mg | INTRAVENOUS | Status: DC | PRN
Start: 2014-01-17 — End: 2014-01-17

## 2014-01-17 MED ORDER — KETOROLAC TROMETHAMINE 30 MG/ML IJ SOLN: 30.0000 mg | Freq: Four times a day (QID) | INTRAMUSCULAR | Status: AC | PRN

## 2014-01-17 MED ORDER — PRENATAL MULTIVITAMIN CH
1.0000 | ORAL_TABLET | Freq: Every day | ORAL | Status: DC
  Administered 2014-01-18 – 2014-01-20 (×3): 1 via ORAL
  Filled 2014-01-17 (×3): qty 1

## 2014-01-17 MED ORDER — EPHEDRINE 5 MG/ML INJ: 10.0000 mg | INTRAVENOUS | Status: DC | PRN

## 2014-01-17 MED ORDER — ONDANSETRON HCL 4 MG PO TABS: 4.0000 mg | ORAL_TABLET | ORAL | Status: DC | PRN

## 2014-01-17 MED ORDER — OXYCODONE-ACETAMINOPHEN 5-325 MG PO TABS
1.0000 | ORAL_TABLET | ORAL | Status: DC | PRN
  Administered 2014-01-18 – 2014-01-20 (×10): 1 via ORAL
  Filled 2014-01-17 (×10): qty 1

## 2014-01-17 MED ORDER — ONDANSETRON HCL 4 MG/2ML IJ SOLN
INTRAMUSCULAR | Status: DC | PRN
  Administered 2014-01-17: 4 mg via INTRAVENOUS

## 2014-01-17 MED ORDER — SCOPOLAMINE 1 MG/3DAYS TD PT72
MEDICATED_PATCH | TRANSDERMAL | Status: AC
  Administered 2014-01-17: 1.5 mg via TRANSDERMAL
  Filled 2014-01-17: qty 1

## 2014-01-17 MED ORDER — LACTATED RINGERS IV SOLN
INTRAVENOUS | Status: DC | PRN
  Administered 2014-01-17 (×2): via INTRAVENOUS

## 2014-01-17 MED ORDER — SIMETHICONE 80 MG PO CHEW
80.0000 mg | CHEWABLE_TABLET | ORAL | Status: DC | PRN
  Filled 2014-01-17: qty 1

## 2014-01-17 MED ORDER — PHENYLEPHRINE 40 MCG/ML (10ML) SYRINGE FOR IV PUSH (FOR BLOOD PRESSURE SUPPORT)
PREFILLED_SYRINGE | INTRAVENOUS | Status: AC
  Filled 2014-01-17: qty 10

## 2014-01-17 MED ORDER — DIPHENHYDRAMINE HCL 50 MG/ML IJ SOLN: 25.0000 mg | INTRAMUSCULAR | Status: DC | PRN

## 2014-01-17 MED ORDER — 0.9 % SODIUM CHLORIDE (POUR BTL) OPTIME
TOPICAL | Status: DC | PRN
  Administered 2014-01-17: 1000 mL

## 2014-01-17 MED ORDER — METOCLOPRAMIDE HCL 5 MG/ML IJ SOLN: 10.0000 mg | Freq: Once | INTRAMUSCULAR | Status: DC | PRN

## 2014-01-17 SURGICAL SUPPLY — 43 items
ADH SKN CLS APL DERMABOND .7 (GAUZE/BANDAGES/DRESSINGS) ×1
CANISTER WOUND CARE 500ML ATS (WOUND CARE) IMPLANT
CLAMP CORD UMBIL (MISCELLANEOUS) IMPLANT
CLOTH BEACON ORANGE TIMEOUT ST (SAFETY) ×3 IMPLANT
CONTAINER PREFILL 10% NBF 15ML (MISCELLANEOUS) ×6 IMPLANT
DERMABOND ADVANCED (GAUZE/BANDAGES/DRESSINGS) ×2
DERMABOND ADVANCED .7 DNX12 (GAUZE/BANDAGES/DRESSINGS) ×1 IMPLANT
DRAPE LG THREE QUARTER DISP (DRAPES) IMPLANT
DRSG OPSITE POSTOP 4X10 (GAUZE/BANDAGES/DRESSINGS) ×3 IMPLANT
DRSG VAC ATS LRG SENSATRAC (GAUZE/BANDAGES/DRESSINGS) IMPLANT
DRSG VAC ATS MED SENSATRAC (GAUZE/BANDAGES/DRESSINGS) IMPLANT
DRSG VAC ATS SM SENSATRAC (GAUZE/BANDAGES/DRESSINGS) IMPLANT
DURAPREP 26ML APPLICATOR (WOUND CARE) ×3 IMPLANT
ELECT REM PT RETURN 9FT ADLT (ELECTROSURGICAL) ×3
ELECTRODE REM PT RTRN 9FT ADLT (ELECTROSURGICAL) ×1 IMPLANT
EXTRACTOR VACUUM M CUP 4 TUBE (SUCTIONS) IMPLANT
EXTRACTOR VACUUM M CUP 4' TUBE (SUCTIONS)
GLOVE BIO SURGEON STRL SZ8 (GLOVE) ×6 IMPLANT
GOWN STRL REUS W/TWL LRG LVL3 (GOWN DISPOSABLE) ×6 IMPLANT
KIT ABG SYR 3ML LUER SLIP (SYRINGE) IMPLANT
NDL HYPO 25X5/8 SAFETYGLIDE (NEEDLE) ×1 IMPLANT
NEEDLE HYPO 25X5/8 SAFETYGLIDE (NEEDLE) ×3 IMPLANT
NS IRRIG 1000ML POUR BTL (IV SOLUTION) ×3 IMPLANT
PACK C SECTION WH (CUSTOM PROCEDURE TRAY) ×3 IMPLANT
PAD OB MATERNITY 4.3X12.25 (PERSONAL CARE ITEMS) ×3 IMPLANT
RTRCTR C-SECT PINK 25CM LRG (MISCELLANEOUS) ×3 IMPLANT
STAPLER VISISTAT 35W (STAPLE) IMPLANT
SUT GUT PLAIN 0 CT-3 TAN 27 (SUTURE) IMPLANT
SUT MNCRL 0 VIOLET CTX 36 (SUTURE) ×3 IMPLANT
SUT MNCRL AB 4-0 PS2 18 (SUTURE) IMPLANT
SUT MON AB 2-0 CT1 27 (SUTURE) ×3 IMPLANT
SUT MON AB 3-0 SH 27 (SUTURE)
SUT MON AB 3-0 SH27 (SUTURE) IMPLANT
SUT MONOCRYL 0 CTX 36 (SUTURE) ×6
SUT PDS AB 0 CTX 60 (SUTURE) IMPLANT
SUT PLAIN 2 0 XLH (SUTURE) IMPLANT
SUT VIC AB 0 CTX 36 (SUTURE) ×6
SUT VIC AB 0 CTX36XBRD ANBCTRL (SUTURE) IMPLANT
SUT VIC AB 2-0 CT1 27 (SUTURE)
SUT VIC AB 2-0 CT1 TAPERPNT 27 (SUTURE) IMPLANT
TOWEL OR 17X24 6PK STRL BLUE (TOWEL DISPOSABLE) ×3 IMPLANT
TRAY FOLEY CATH 14FR (SET/KITS/TRAYS/PACK) ×3 IMPLANT
WATER STERILE IRR 1000ML POUR (IV SOLUTION) ×1 IMPLANT

## 2014-01-17 NOTE — Anesthesia Postprocedure Evaluation (Signed)
  Anesthesia Post-op Note  Patient: Tracy Grant  Procedure(s) Performed: Procedure(s): CESAREAN SECTION (N/A)  Patient Location: PACU  Anesthesia Type:Spinal  Level of Consciousness: awake, alert  and oriented  Airway and Oxygen Therapy: Patient Spontanous Breathing  Post-op Pain: none  Post-op Assessment: Post-op Vital signs reviewed, Patient's Cardiovascular Status Stable, Respiratory Function Stable, Patent Airway, No signs of Nausea or vomiting, Pain level controlled, No headache, No backache and No residual numbness  Post-op Vital Signs: Reviewed and stable  Last Vitals:  Filed Vitals:   01/17/14 0745  BP: 128/82  Pulse: 86  Temp:   Resp: 20    Complications: No apparent anesthesia complications

## 2014-01-17 NOTE — Anesthesia Postprocedure Evaluation (Signed)
  Anesthesia Post-op Note  Patient: Tracy Grant  Procedure(s) Performed: Procedure(s): CESAREAN SECTION (N/A)  Patient Location: Mother/Baby  Anesthesia Type:Spinal  Level of Consciousness: awake, alert  and oriented  Airway and Oxygen Therapy: Patient Spontanous Breathing  Post-op Pain: none  Post-op Assessment: Post-op Vital signs reviewed, Patient's Cardiovascular Status Stable, No headache, No backache, No residual numbness and No residual motor weakness  Post-op Vital Signs: Reviewed and stable  Last Vitals:  Filed Vitals:   01/17/14 0915  BP: 137/89  Pulse: 73  Temp: 36.7 C  Resp: 18    Complications: No apparent anesthesia complications

## 2014-01-17 NOTE — Op Note (Signed)
Cesarean Section Procedure Note   Tracy Grant   01/16/2014 - 01/17/2014  Indications: Breech Presentation and SROM.  PIH.   Pre-operative Diagnosis: Breech, Ruptured Membranes, PIH.   Post-operative Diagnosis: Same   Surgeon: Brock Bad  Assistants: Surgical Technician  Anesthesia: spinal  Procedure Details:  The patient was seen in the Holding Room. The risks, benefits, complications, treatment options, and expected outcomes were discussed with the patient. The patient concurred with the proposed plan, giving informed consent. The patient was identified as Tracy Grant and the procedure verified as C-Section Delivery. A Time Out was held and the above information confirmed.  After induction of anesthesia, the patient was draped and prepped in the usual sterile manner. A transverse incision was made and carried down through the subcutaneous tissue to the fascia. The fascial incision was made and extended transversely. The fascia was separated from the underlying rectus tissue superiorly and inferiorly. The peritoneum was identified and entered. The peritoneal incision was extended longitudinally. The utero-vesical peritoneal reflection was incised transversely and the bladder flap was bluntly freed from the lower uterine segment. A low transverse uterine incision was made. Delivered from cephalic presentation was a 3780 gram living newborn female infant(s). APGAR (1 MIN): 2   APGAR (5 MINS): 9   APGAR (10 MINS):    A cord ph was not sent. The umbilical cord was clamped and cut cord. A sample was obtained for evaluation. The placenta was removed Intact and appeared normal.  The uterine incision was closed with running locked sutures of 0 Monocryl. A second imbricating layer of the same suture was placed.  Hemostasis was observed. The paracolic gutters were irrigated. The parieto peritoneum was closed in a running fashion with 2-0 Vicryl.  The fascia was then reapproximated with  running sutures of 0 Vicryl.  The skin was closed with staples.  Instrument, sponge, and needle counts were correct prior the abdominal closure and were correct at the conclusion of the case.    Findings:   Estimated Blood Loss:  Total IV Fluids:   Urine Output: 150CC OF clear urine  Specimens: Placenta to Pathology  Complications: no complications  Disposition: PACU - hemodynamically stable.  Maternal Condition: stable   Baby condition / location:  Couplet care / Skin to Skin    Signed: Surgeon(s): Brock Bad, MD

## 2014-01-17 NOTE — Addendum Note (Signed)
Addendum created 01/17/14 1558 by Shanon Payor, CRNA   Modules edited: Notes Section   Notes Section:  File: [id number]

## 2014-01-17 NOTE — Transfer of Care (Signed)
Immediate Anesthesia Transfer of Care Note  Patient: Tracy Grant  Procedure(s) Performed: Procedure(s): CESAREAN SECTION (N/A)  Patient Location: PACU  Anesthesia Type:Spinal  Level of Consciousness: awake  Airway & Oxygen Therapy: Patient Spontanous Breathing  Post-op Assessment: Report given to PACU RN and Post -op Vital signs reviewed and stable  Post vital signs: stable  Complications: No apparent anesthesia complications

## 2014-01-17 NOTE — Clinical Social Work Maternal (Signed)
    Clinical Social Work Department PSYCHOSOCIAL ASSESSMENT - MATERNAL/CHILD 01/17/2014  Patient:  Tracy Grant, Tracy Grant  Account Number:  [id number]  Admit Date:  01/16/2014  Marjo Bicker Name:   Tracy Grant    Clinical Social Worker:  Nobie Putnam, LCSW   Date/Time:  01/17/2014 01:01 PM  Date Referred:  01/17/2014   Referral source  CN     Referred reason  Other - See comment   Other referral source:    I:  FAMILY / HOME ENVIRONMENT Child's legal guardian:  PARENT  Guardian - Name Guardian - Age Guardian - Address  Tracy Grant 27 [address] [place], [place] [address]  Tracy Grant 30    Other household support members/support persons Other support:    II  PSYCHOSOCIAL DATA Information Source:  Patient Interview  Event organiser Employment:   Surveyor, quantity resources:  [employer] If Medicaid - County:  [place] Other  WIC   School / Grade:   Maternity Care Coordinator / Child Services Coordination / Early Interventions:  Cultural issues impacting care:    III  STRENGTHS Strengths  Adequate Resources  Home prepared for Child (including basic supplies)  Supportive family/friends   Strength comment:    IV  RISK FACTORS AND CURRENT PROBLEMS Current Problem:  YES   Risk Factor & Current Problem Patient Issue Family Issue Risk Factor / Current Problem Comment  Housing Concerns Y N Pt lives @ [place]    N N     V  SOCIAL WORK ASSESSMENT CSW referral received assess her current social situation (housing) & offer resources as needed.  Pt [facility] into [facility], a homeless shelter for pregnant women, after being evicted from her apartment in Dec.'14.  Pt explained that she loss her job & was not able to pay the rent any longer.  She has remained at [facility] since.  While there, she has received counseling services from [facility], monthly.  She denies any mental health diagnoses.  Upon discharge, pt plans to move in with her father,  Tracy Grant.  Pt was not able to give CSW the address, as she said her father recently moved into his place.  She states she and the infant will be allowed to stay residence until she can gain independence again.  She identified her mother, Tracy Grant as a primary support person as well.  Pt's parents are separated.  Pt has all the necessary supplies for the infant & appears to be bonding well.  Pt received services from [facility] during the pregnancy.  Her worker was [employer]. CSW spoke with Ms. Maisie Fus, Quarry manager at [facility] to verify pt's plan at discharge.  No barriers to discharge identified at this time.  CSW available to assist further if needed.      VI SOCIAL WORK PLAN Social Work Plan  No Further Intervention Required / No Barriers to Discharge   Type of pt/family education:   If child protective services report - county:   If child protective services report - date:   Information/referral to community resources comment:   Other social work plan:

## 2014-01-17 NOTE — Anesthesia Preprocedure Evaluation (Addendum)
Anesthesia Evaluation  Patient identified by MRN, date of birth, ID band Patient awake    Reviewed: Allergy & Precautions, H&P , NPO status , Patient's Chart, lab work & pertinent test results  History of Anesthesia Complications Negative for: history of anesthetic complications  Airway       Dental  (+) Teeth Intact   Pulmonary neg pulmonary ROS, former smoker,  breath sounds clear to auscultation        Cardiovascular hypertension (GHTN), Rhythm:regular Rate:Normal     Neuro/Psych  Headaches, negative psych ROS   GI/Hepatic negative GI ROS, Neg liver ROS,   Endo/Other  Morbid obesity  Renal/GU negative Renal ROS     Musculoskeletal   Abdominal   Peds  Hematology negative hematology ROS (+)   Anesthesia Other Findings NPO since 1800  Reproductive/Obstetrics (+) Pregnancy (breech, SROM, GHTN --> C/S)                         Anesthesia Physical Anesthesia Plan  ASA: III and emergent  Anesthesia Plan: Spinal   Post-op Pain Management:    Induction:   Airway Management Planned:   Additional Equipment:   Intra-op Plan:   Post-operative Plan:   Informed Consent: I have reviewed the patients History and Physical, chart, labs and discussed the procedure including the risks, benefits and alternatives for the proposed anesthesia with the patient or authorized representative who has indicated his/her understanding and acceptance.     Plan Discussed with: Surgeon and CRNA  Anesthesia Plan Comments:        Anesthesia Quick Evaluation

## 2014-01-17 NOTE — Anesthesia Procedure Notes (Signed)
Spinal  Patient location during procedure: OR Start time: 01/17/2014 5:55 AM Staffing Performed by: anesthesiologist  Preanesthetic Checklist Completed: patient identified, site marked, surgical consent, pre-op evaluation, timeout performed, IV checked, risks and benefits discussed and monitors and equipment checked Spinal Block Patient position: sitting Prep: site prepped and draped and DuraPrep Patient monitoring: heart rate, cardiac monitor, continuous pulse ox and blood pressure Approach: midline Location: L3-4 Injection technique: single-shot Needle Needle type: Pencan  Needle gauge: 24 G Needle length: 9 cm Assessment Sensory level: T4 Additional Notes Clear free flow CSF on first pass.  No paresthesia.  Patient tolerated procedure well with no apparent complications.  Jasmine December, MD

## 2014-01-17 NOTE — Progress Notes (Signed)
Tracy Grant is a 28 y.o. G3P0020 at [redacted]w[redacted]d by LMP admitted for PIH  Subjective:   Objective: BP 165/110  Pulse 76  Temp(Src) 98.2 F (36.8 C) (Oral)  Resp 20  Ht 5\' 6"  (1.676 m)  Wt 249 lb (112.946 kg)  BMI 40.21 kg/m2  LMP 03/16/2013      FHT:  FHR: 140 bpm, variability: moderate,  accelerations:  Present,  decelerations:  Absent UC:   Irregular SVE:   Dilation: Closed Effacement (%): Thick Station: -3 Exam by:: a. white rn SROM, meconium, Breech Presentation Labs: Lab Results  Component Value Date   WBC 7.8 01/17/2014   HGB 11.9* 01/17/2014   HCT 34.7* 01/17/2014   MCV 97.2 01/17/2014   PLT 142* 01/17/2014    Assessment / Plan: 40 weeks.  PIH.  SROM.  Meconium.  Breech Presentation.  Will proceed with C/S for Breech,  with SROM.  Labor: none Preeclampsia:  no signs or symptoms of toxicity and labs stable Fetal Wellbeing:  Category I Pain Control:  Nubain I/D:  n/a Anticipated MOD:  Cesarean Section.  Brock Bad 01/17/2014, 5:36 AM

## 2014-01-18 ENCOUNTER — Encounter (HOSPITAL_COMMUNITY): Payer: Self-pay | Admitting: Obstetrics

## 2014-01-18 LAB — CBC
HEMATOCRIT: 30.8 % — AB (ref 36.0–46.0)
Hemoglobin: 10.5 g/dL — ABNORMAL LOW (ref 12.0–15.0)
MCH: 33.2 pg (ref 26.0–34.0)
MCHC: 34.1 g/dL (ref 30.0–36.0)
MCV: 97.5 fL (ref 78.0–100.0)
Platelets: 155 10*3/uL (ref 150–400)
RBC: 3.16 MIL/uL — ABNORMAL LOW (ref 3.87–5.11)
RDW: 13.3 % (ref 11.5–15.5)
WBC: 7 10*3/uL (ref 4.0–10.5)

## 2014-01-18 NOTE — Lactation Note (Signed)
This note was copied from the chart of Tracy Grant. Lactation Consultation Note Mom has LE edema, has Lg. Pendulum breast, semi flat nipples, when compresses flattens. Hand pump breast to pull nipple out more. Has good colostrum. Elevated breast on dry wash cloth, unable to latch to nipple d/t thickness. #24 NS applied and demonstrated. Baby latched and fed well. Shells given to wear in am. #30 flanges for hand pump. Encouraged to call for latch verification. Patient Name: Tracy Rodolfo Yoakam [id number] Date: 01/18/2014 Reason for consult: Difficult latch   Maternal Data    Feeding Feeding Type: Breast Fed Length of feed: 15 min  LATCH Score/Interventions Latch: Grasps breast easily, tongue down, lips flanged, rhythmical sucking. Intervention(s): Adjust position;Assist with latch;Breast massage;Breast compression  Audible Swallowing: A few with stimulation Intervention(s): Skin to skin;Hand expression Intervention(s): Skin to skin;Hand expression;Alternate breast massage  Type of Nipple: Flat Intervention(s): Reverse pressure;Shells;Hand pump (flattens when compressed)  Comfort (Breast/Nipple): Soft / non-tender     Hold (Positioning): Assistance needed to correctly position infant at breast and maintain latch. Intervention(s): Breastfeeding basics reviewed;Support Pillows;Position options;Skin to skin  LATCH Score: 7  Lactation Tools Discussed/Used Tools: Shells;Nipple Shields;Pump;Flanges Nipple shield size: 24 Flange Size: 30 Shell Type: Inverted Breast pump type: Manual   Consult Status Consult Status: Follow-up Date: 01/18/14 Follow-up type: In-patient    Charyl Dancer 01/18/2014, 4:37 AM

## 2014-01-18 NOTE — Lactation Note (Signed)
This note was copied from the chart of Tracy Grant. Lactation Consultation Note  Follow up consult;  Upon entering the room, Mother with large pendulous breasts is having a difficult latching baby without NS. Reviewed hand expression with teachback, colostrum expressed. Baby latched briefly with #24NS but tongue thrusts NS off.  Mother had hand pumped breastmilk 1 ml.  Demonstrated syringe finger feeding. Although mother's anatomy fits #24NS better, baby latched better with #20NS.  Sucks and swallows observed 15 min. Baby needed breast massage and stimulation to keep actively sucking.  Set up DEBP, reviewed cleaning and milk storage.  Plan is for mother to post pump for 15 min 4-6 times a day and give baby milk that is pumped. Provided mother volume guidelines for breastmilk supplementation. Reviewed massage and hand expression before and after pumping and feeding. Left mother in room pumping with DEBP, good flow of colostrum being pumped. Encouraged her to call for further assistance.    Patient Name: Tracy Grant [id number] Date: 01/18/2014 Reason for consult: Follow-up assessment   Maternal Data    Feeding Length of feed: 15 min  LATCH Score/Interventions Latch: Repeated attempts needed to sustain latch, nipple held in mouth throughout feeding, stimulation needed to elicit sucking reflex. Intervention(s): Breast massage;Assist with latch  Audible Swallowing: A few with stimulation Intervention(s): Skin to skin;Hand expression  Type of Nipple: Flat Intervention(s): Hand pump;Double electric pump  Comfort (Breast/Nipple): Soft / non-tender     Hold (Positioning): Assistance needed to correctly position infant at breast and maintain latch. Intervention(s): Breastfeeding basics reviewed;Position options  LATCH Score: 6  Lactation Tools Discussed/Used Tools: Pump Nipple shield size: 24 Flange Size: 30 Shell Type: Inverted Breast pump type: Double-Electric  Breast Pump WIC Program: Yes Pump Review: Milk Storage;Setup, frequency, and cleaning   Consult Status Consult Status: Follow-up Date: 01/19/14 Follow-up type: In-patient    Dulce Sellar Berkelhammer 01/18/2014, 1:27 PM

## 2014-01-18 NOTE — Progress Notes (Signed)
Subjective: Postpartum Day 1: Cesarean Delivery Patient reports tolerating PO and no problems voiding.    Objective: Vital signs in last 24 hours: Temp:  [98 F (36.7 C)-99.2 F (37.3 C)] 98.8 F (37.1 C) (06/05 0401) Pulse Rate:  [69-98] 78 (06/05 0401) Resp:  [18-20] 18 (06/05 0401) BP: (126-132)/(71-86) 126/76 mmHg (06/05 0401) SpO2:  [100 %] 100 % (06/05 0021)  Physical Exam:  General: alert and no distress Lochia: appropriate Uterine Fundus: firm Incision: healing well DVT Evaluation: No evidence of DVT seen on physical exam.   Recent Labs  01/17/14 0430 01/18/14 0550  HGB 11.9* 10.5*  HCT 34.7* 30.8*    Assessment/Plan: Status post Cesarean section. Doing well postoperatively.  Continue current care.  Brock Bad 01/18/2014, 11:11 AM

## 2014-01-19 NOTE — Lactation Note (Signed)
This note was copied from the chart of Tracy Grant. Lactation Consultation Note: Mom resting- reports that baby has been nursing well with the NS but her nipples are getting a little sore. Baby asleep in bassinet. Mom reports that she pumped once this morning but did not obtain any Colostrum. Encouraged to page for assist at next feeding. No questions at present.  Patient Name: Tracy Josiephine Mirkin [id number] Date: 01/19/2014 Reason for consult: Follow-up assessment   Maternal Data    Feeding   LATCH Score/Interventions                      Lactation Tools Discussed/Used     Consult Status Consult Status: Follow-up Date: 01/19/14 Follow-up type: In-patient    Pamelia Hoit 01/19/2014, 2:40 PM

## 2014-01-19 NOTE — Lactation Note (Signed)
This note was copied from the chart of Tracy Grant. Lactation Consultation Note  Patient Name: Tracy Grant [id number] Date: 01/19/2014 Reason for consult: Follow-up assessment;Difficult latch;Breast/nipple pain especially on (R) nipple.  Both nipples are irritated across tips and baby has been sucking vigorously on a stubby pacifier, which LC cautioned against due to its' possible effects on baby's latch and hiding feeding cues.  Baby has only breastfed 4 times since midnight and is extremely fussy when pacifier removed.  His tongue is cupped and no sign of a tight frenulum seen.  He is able to latch on both breasts in football position, using #24 NS and mom has easily expressible colostrum/milk which is also starting to spontaneously leak on opposite side while she is breastfeeding.  Baby sustained 10 minutes on (L), started fussing, burped and was able to transition to (R) and mom denies nipple pain with this feeding.  LC reviewed signs of deep latch and effective feeding and provided written guidelines for breastfeeding 8-12 times per 24 hours.  LC also reviewed waking and calming techniques.  LC reported feeding assessment to RN, Stormy and encouraged mom to call for help as needed this evening.   Maternal Data    Feeding Feeding Type: Breast Fed Length of feed: 20 min  LATCH Score/Interventions Latch: Repeated attempts needed to sustain latch, nipple held in mouth throughout feeding, stimulation needed to elicit sucking reflex. (chin tug needed to ensure deep latch) Intervention(s): Adjust position;Assist with latch;Breast compression (chin tug and breast support)  Audible Swallowing: Spontaneous and intermittent Intervention(s): Skin to skin;Hand expression Intervention(s): Skin to skin;Hand expression;Alternate breast massage  Type of Nipple: Everted at rest and after stimulation Intervention(s): Shells;Hand pump  Comfort (Breast/Nipple): Filling, red/small blisters or  bruises, mild/mod discomfort  Problem noted: Mild/Moderate discomfort Interventions (Mild/moderate discomfort): Comfort gels;Pre-pump if needed;Hand expression;Hand massage  Hold (Positioning): Assistance needed to correctly position infant at breast and maintain latch. Intervention(s): Position options;Skin to skin;Support Pillows;Breastfeeding basics reviewed  LATCH Score: 7  Lactation Tools Discussed/Used Tools: Shells;Nipple Shields;Comfort gels Nipple shield size: 24 Flange Size: 30 Shell Type: Inverted Breast pump type: Manual Waking and calming techniques Signs of proper latch and milk transfer Minimum feeding of 8-12 per 24  Consult Status Consult Status: Follow-up Date: 01/20/14 Follow-up type: In-patient    Zara Chess 01/19/2014, 5:44 PM

## 2014-01-19 NOTE — Progress Notes (Signed)
Subjective: Postpartum Day 2: Cesarean Delivery Patient reports tolerating PO, + flatus and no problems voiding.    Objective: Vital signs in last 24 hours: Temp:  [98.2 F (36.8 C)-99.2 F (37.3 C)] 98.5 F (36.9 C) (06/06 0531) Pulse Rate:  [18-85] 81 (06/06 0531) Resp:  [18] 18 (06/06 0531) BP: (135-147)/(72-90) 135/90 mmHg (06/06 0531)  Physical Exam:  General: alert and no distress Lochia: appropriate Uterine Fundus: firm Incision: healing well DVT Evaluation: No evidence of DVT seen on physical exam.   Recent Labs  01/17/14 0430 01/18/14 0550  HGB 11.9* 10.5*  HCT 34.7* 30.8*    Assessment/Plan: Status post Cesarean section. Doing well postoperatively.  Continue current care.  Brock Bad 01/19/2014, 6:42 AM

## 2014-01-20 MED ORDER — OXYCODONE-ACETAMINOPHEN 5-325 MG PO TABS: 1.0000 | ORAL_TABLET | ORAL | Status: DC | PRN

## 2014-01-20 MED ORDER — IBUPROFEN 600 MG PO TABS: 600.0000 mg | ORAL_TABLET | Freq: Four times a day (QID) | ORAL | Status: DC | PRN

## 2014-01-20 NOTE — Discharge Summary (Signed)
Obstetric Discharge Summary Reason for Admission: elevated BP Prenatal Procedures: ultrasound Intrapartum Procedures: cesarean: low cervical, transverse Postpartum Procedures: none Complications-Operative and Postpartum: none Hemoglobin  Date Value Ref Range Status  01/18/2014 10.5* 12.0 - 15.0 g/dL Final     HCT  Date Value Ref Range Status  01/18/2014 30.8* 36.0 - 46.0 % Final    Physical Exam:  General: alert and no distress Lochia: appropriate Uterine Fundus: firm Incision: healing well DVT Evaluation: No evidence of DVT seen on physical exam.  Discharge Diagnoses: Term Pregnancy-delivered  Discharge Information: Date: 01/20/2014 Activity: pelvic rest Diet: routine Medications: PNV, Ibuprofen, Colace and Percocet Condition: stable Instructions: refer to practice specific booklet Discharge to: home Follow-up Information   Follow up with Jaylie Neaves A, MD. Schedule an appointment as soon as possible for a visit in 2 weeks.   Specialty:  Obstetrics and Gynecology   Contact information:   [address] [place] [address] [phone]       Newborn Data: Live born female  Birth Weight: 8 lb 5.3 oz (3780 g) APGAR: 2, 9  Home with mother.  Brock Bad 01/20/2014, 5:40 AM

## 2014-01-20 NOTE — Progress Notes (Signed)
Subjective: Postpartum Day 3: Cesarean Delivery Patient reports tolerating PO, + flatus, + BM and no problems voiding.    Objective: Vital signs in last 24 hours: Temp:  [98.7 F (37.1 C)] 98.7 F (37.1 C) (06/06 1807) Pulse Rate:  [91] 91 (06/06 1807) Resp:  [18] 18 (06/06 1807) BP: (132)/(82) 132/82 mmHg (06/06 1807)  Physical Exam:  General: alert and no distress Lochia: appropriate Uterine Fundus: firm Incision: healing well DVT Evaluation: No evidence of DVT seen on physical exam.   Recent Labs  01/18/14 0550  HGB 10.5*  HCT 30.8*    Assessment/Plan: Status post Cesarean section. Doing well postoperatively.  Discharge home with standard precautions and return to clinic in 4-6 weeks.  Brock Bad 01/20/2014, 5:32 AM

## 2014-01-20 NOTE — Discharge Instructions (Signed)
Before Baby Comes Home °Ask any questions about feeding, diapering, and baby care before you leave the hospital. Ask again if you do not understand. Ask when you need to see the doctor again. °There are several things you must have before your baby comes home. °· Infant car seat. °· Crib. °· Do not let your baby sleep in a bed with you or anyone else. °· If you do not have a bed for your baby, ask the doctor what you can use that will be safe for the baby to sleep in. °Infant feeding supplies: °· 6 to 8 bottles (8 oz. size). °· 6 to 8 nipples. °· Measuring cup. °· Measuring tablespoon. °· Bottle brush. °· Sterilizer (or use any large pan or kettle with a lid). °· Formula that contains iron. °· A way to boil and cool water. °Breastfeeding supplies: °· Breast pump. °· Nipple cream. °Clothing: °· 24 to 36 cloth diapers and waterproof diaper covers or a box of disposable diapers. You may need as many as 10 to 12 diapers per day. °· 3 onesies (other clothing will depend on the time of year and the weather). °· 3 receiving blankets. °· 3 baby pajamas or gowns. °· 3 bibs. °Bath equipment: °· Mild soap. °· Petroleum jelly. No baby oil or powder. °· Soft cloth towel and wash cloth. °· Cotton balls. °· Separate bath basin for baby. Only sponge bathe until umbilical cord and circumcision are healed. °Other supplies: °· Thermometer and bulb syringe (ask the hospital to send them home with you). Ask your doctor about how you should take your baby's temperature. °· One to two pacifiers. °Prepare for an emergency: °· Know how to get to the hospital and know where to admit your baby. °· Put all doctor numbers near your house phone and in your cell phone if you have one. °Prepare your family: °· Talk with siblings about the baby coming home and how they feel about it. °· Decide how you want to handle visitors and other family members. °· Take offers for help with the baby. You will need time to adjust. °Know when to call the doctor.   °GET HELP RIGHT AWAY IF: °· Your baby's temperature is greater than 100.4° F (38° C). °· The softspot on your baby's head starts to bulge. °· Your baby is crying with no tears or has no wet diapers for 6 hours. °· Your baby has rapid breathing. °· Your baby is not as alert. °Document Released: 07/15/2008 Document Revised: 10/25/2011 Document Reviewed: 10/22/2010 °[employer]® Patient Information ©2014 [employer]. ° °

## 2014-01-20 NOTE — Lactation Note (Signed)
This note was copied from the chart of Tracy Genni Carlock. Lactation Consultation Note  Patient Name: Tracy Grant [id number] Date: 01/20/2014 Reason for consult: Follow-up assessment- Per mom I got really tired of working with the nipple shield. So I decided to pump and bottle feed , I may relatch. LC assessed breast tissue and noted the right areola to be swollen and semi compressible  And the left compressible and the milk easily expresses.  LC recommended and encouraged mom to try relatching, may need to give a small appetizer  From a bottle and then latch with breast compressions until comfort t is achieved and baby is in a consistent pattern swallowing. Reviewed sore nipple and engorgement prevention and tx , mom has been using comfort gels , breast shells, hand pump and DEBP. Per mom active with WIC and plans to call [facility] for DEBP loaner . In the mean time showed mom how to use the DEBP set up manually or hand pump. Mother informed of post-discharge support and given phone number to the lactation department, including services for phone call assistance; out-patient appointments; and breastfeeding support group. List of other breastfeeding resources in the community given in the handout. Encouraged mother to call for problems or concerns related to breastfeeding. Encouraged mom to call if she desired to re-latch.    Maternal Data Has patient been taught Hand Expression?: Yes  Feeding Feeding Type:  (per mom I've been pumping and bottlefeeding )  LATCH Score/Interventions          Comfort (Breast/Nipple):  (milk is in ,mom recently pumped and obtained 30 ml )     Intervention(s): Breastfeeding basics reviewed     Lactation Tools Discussed/Used Nipple shield size:  (per mom feels pumping and bottlefeeding is easier than the NS ) WIC Program:  (encouraged to call [facility] )   Consult Status Consult Status: Complete    Tracy Grant 01/20/2014, 11:10 AM

## 2014-01-20 NOTE — Progress Notes (Signed)
Given orders by Dr. Clearance Coots to remove honeycomb and staples and apply steri strips. While removing staples, right side of incision began to slightly separate. Applied strips to right side and left staples in left side. Called Dr. Clearance Coots who advised me to leave incision as is and apply new honeycomb dressing. He will reevaluate incision in office next week.

## 2014-01-21 NOTE — Progress Notes (Deleted)
Ur chart review completed.  

## 2014-01-21 NOTE — Progress Notes (Signed)
Ur chart review completed.  

## 2014-01-29 ENCOUNTER — Encounter: Payer: Self-pay | Admitting: Obstetrics

## 2014-01-29 ENCOUNTER — Ambulatory Visit (INDEPENDENT_AMBULATORY_CARE_PROVIDER_SITE_OTHER): Payer: Medicaid Other | Admitting: Obstetrics

## 2014-01-29 NOTE — Progress Notes (Signed)
Subjective:     Tracy Grant is a 28 y.o. female who presents for a postpartum visit. She is 1 week postpartum following a low cervical transverse Cesarean section. I have fully reviewed the prenatal and intrapartum course. The delivery was at 40 gestational weeks. Outcome: primary cesarean section, low transverse incision. Anesthesia: spinal. Postpartum course has been normal. Baby's course has been normal. Baby is feeding by breast. Bleeding thin lochia. Bowel function is normal. Bladder function is normal. Patient is not sexually active. Contraception method is abstinence. Postpartum depression screening: negative.  The following portions of the patient's history were reviewed and updated as appropriate: allergies, current medications, past family history, past medical history, past social history, past surgical history and problem list.  Review of Systems A comprehensive review of systems was negative.   Objective:    BP 158/101  Pulse 80  Temp(Src) 98 F (36.7 C)  Ht 5\' 6"  (1.676 m)  Wt 234 lb (106.142 kg)  BMI 37.79 kg/m2  LMP 03/16/2013  Breastfeeding? Yes  General:  alert and no distress Abdomen:  Soft, NT.  Incision C, D, I.                    Staples removed and steri strips applied.      Assessment:     Normal postpartum exam. Pap smear not done at today's visit.   Plan:    1. Contraception: abstinence 2. Continue PNV's 3. Follow up in: 2 weeks or as needed.

## 2014-05-16 ENCOUNTER — Inpatient Hospital Stay (HOSPITAL_COMMUNITY)
Admission: EM | Admit: 2014-05-16 | Discharge: 2014-05-19 | DRG: 308 | Disposition: A | Discharge status: Home / Self Care | Payer: Medicaid Other | Attending: Internal Medicine | Admitting: Internal Medicine

## 2014-05-16 ENCOUNTER — Encounter (HOSPITAL_COMMUNITY): Payer: Self-pay | Admitting: Emergency Medicine

## 2014-05-16 ENCOUNTER — Emergency Department (HOSPITAL_COMMUNITY): Payer: Medicaid Other

## 2014-05-16 DIAGNOSIS — Z87891 Personal history of nicotine dependence: Secondary | ICD-10-CM | POA: Diagnosis not present

## 2014-05-16 DIAGNOSIS — E785 Hyperlipidemia, unspecified: Secondary | ICD-10-CM | POA: Diagnosis present

## 2014-05-16 DIAGNOSIS — I4892 Unspecified atrial flutter: Principal | ICD-10-CM | POA: Diagnosis present

## 2014-05-16 DIAGNOSIS — I5021 Acute systolic (congestive) heart failure: Secondary | ICD-10-CM | POA: Diagnosis present

## 2014-05-16 DIAGNOSIS — I483 Typical atrial flutter: Secondary | ICD-10-CM

## 2014-05-16 DIAGNOSIS — I10 Essential (primary) hypertension: Secondary | ICD-10-CM | POA: Diagnosis not present

## 2014-05-16 DIAGNOSIS — R509 Fever, unspecified: Secondary | ICD-10-CM | POA: Diagnosis not present

## 2014-05-16 DIAGNOSIS — R059 Cough, unspecified: Secondary | ICD-10-CM

## 2014-05-16 DIAGNOSIS — F17200 Nicotine dependence, unspecified, uncomplicated: Secondary | ICD-10-CM

## 2014-05-16 DIAGNOSIS — I429 Cardiomyopathy, unspecified: Secondary | ICD-10-CM | POA: Diagnosis not present

## 2014-05-16 DIAGNOSIS — R938 Abnormal findings on diagnostic imaging of other specified body structures: Secondary | ICD-10-CM

## 2014-05-16 DIAGNOSIS — R9389 Abnormal findings on diagnostic imaging of other specified body structures: Secondary | ICD-10-CM | POA: Diagnosis present

## 2014-05-16 DIAGNOSIS — J189 Pneumonia, unspecified organism: Secondary | ICD-10-CM

## 2014-05-16 DIAGNOSIS — R0602 Shortness of breath: Secondary | ICD-10-CM | POA: Diagnosis present

## 2014-05-16 DIAGNOSIS — O139 Gestational [pregnancy-induced] hypertension without significant proteinuria, unspecified trimester: Secondary | ICD-10-CM

## 2014-05-16 DIAGNOSIS — R05 Cough: Secondary | ICD-10-CM | POA: Diagnosis present

## 2014-05-16 DIAGNOSIS — O321XX0 Maternal care for breech presentation, not applicable or unspecified: Secondary | ICD-10-CM

## 2014-05-16 DIAGNOSIS — I059 Rheumatic mitral valve disease, unspecified: Secondary | ICD-10-CM

## 2014-05-16 DIAGNOSIS — J209 Acute bronchitis, unspecified: Secondary | ICD-10-CM

## 2014-05-16 DIAGNOSIS — I428 Other cardiomyopathies: Secondary | ICD-10-CM

## 2014-05-16 LAB — CBC
HEMATOCRIT: 38.4 % (ref 36.0–46.0)
HEMOGLOBIN: 13.2 g/dL (ref 12.0–15.0)
MCH: 31.9 pg (ref 26.0–34.0)
MCHC: 34.4 g/dL (ref 30.0–36.0)
MCV: 92.8 fL (ref 78.0–100.0)
Platelets: 268 10*3/uL (ref 150–400)
RBC: 4.14 MIL/uL (ref 3.87–5.11)
RDW: 12.4 % (ref 11.5–15.5)
WBC: 7.5 10*3/uL (ref 4.0–10.5)

## 2014-05-16 LAB — PROTIME-INR
INR: 1.08 (ref 0.00–1.49)
PROTHROMBIN TIME: 14.1 s (ref 11.6–15.2)

## 2014-05-16 LAB — COMPREHENSIVE METABOLIC PANEL
ALT: 42 U/L — AB (ref 0–35)
ANION GAP: 11 (ref 5–15)
AST: 32 U/L (ref 0–37)
Albumin: 3.8 g/dL (ref 3.5–5.2)
Alkaline Phosphatase: 63 U/L (ref 39–117)
BUN: 11 mg/dL (ref 6–23)
CALCIUM: 9.2 mg/dL (ref 8.4–10.5)
CO2: 25 meq/L (ref 19–32)
CREATININE: 1.1 mg/dL (ref 0.50–1.10)
Chloride: 106 mEq/L (ref 96–112)
GFR, EST AFRICAN AMERICAN: 80 mL/min — AB (ref >90)
GFR, EST NON AFRICAN AMERICAN: 69 mL/min — AB (ref >90)
GLUCOSE: 101 mg/dL — AB (ref 70–99)
Potassium: 4.3 mEq/L (ref 3.7–5.3)
Sodium: 142 mEq/L (ref 137–147)
TOTAL PROTEIN: 7.4 g/dL (ref 6.0–8.3)
Total Bilirubin: 0.4 mg/dL (ref 0.3–1.2)

## 2014-05-16 LAB — RAPID URINE DRUG SCREEN, HOSP PERFORMED
Amphetamines: NOT DETECTED
Barbiturates: NOT DETECTED
Benzodiazepines: NOT DETECTED
Cocaine: NOT DETECTED
OPIATES: NOT DETECTED
Tetrahydrocannabinol: NOT DETECTED

## 2014-05-16 LAB — TROPONIN I: Troponin I: <0.3 ng/mL (ref <0.30)

## 2014-05-16 LAB — MRSA PCR SCREENING: MRSA by PCR: NEGATIVE

## 2014-05-16 LAB — ETHANOL: Alcohol, Ethyl (B): <11 mg/dL (ref 0–11)

## 2014-05-16 LAB — APTT: APTT: 29 s (ref 24–37)

## 2014-05-16 MED ORDER — ACETAMINOPHEN 650 MG RE SUPP: 650.0000 mg | Freq: Four times a day (QID) | RECTAL | Status: DC | PRN

## 2014-05-16 MED ORDER — SODIUM CHLORIDE 0.9 % IV SOLN: 250.0000 mL | INTRAVENOUS | Status: DC

## 2014-05-16 MED ORDER — ONDANSETRON HCL 4 MG/2ML IJ SOLN: 4.0000 mg | Freq: Three times a day (TID) | INTRAMUSCULAR | Status: AC | PRN

## 2014-05-16 MED ORDER — HEPARIN (PORCINE) IN NACL 100-0.45 UNIT/ML-% IJ SOLN
1150.0000 [IU]/h | INTRAMUSCULAR | Status: DC
  Administered 2014-05-16: 950 [IU]/h via INTRAVENOUS
  Administered 2014-05-17: 1150 [IU]/h via INTRAVENOUS
  Filled 2014-05-16 (×4): qty 250

## 2014-05-16 MED ORDER — HEPARIN BOLUS VIA INFUSION
5000.0000 [IU] | Freq: Once | INTRAVENOUS | Status: AC
  Administered 2014-05-16: 5000 [IU] via INTRAVENOUS

## 2014-05-16 MED ORDER — AZITHROMYCIN 500 MG IV SOLR
500.0000 mg | INTRAVENOUS | Status: DC
  Administered 2014-05-16 – 2014-05-17 (×2): 500 mg via INTRAVENOUS
  Filled 2014-05-16 (×3): qty 500

## 2014-05-16 MED ORDER — SODIUM CHLORIDE 0.9 % IJ SOLN
3.0000 mL | Freq: Two times a day (BID) | INTRAMUSCULAR | Status: DC
  Administered 2014-05-17 – 2014-05-18 (×3): 3 mL via INTRAVENOUS

## 2014-05-16 MED ORDER — GUAIFENESIN ER 600 MG PO TB12
600.0000 mg | ORAL_TABLET | Freq: Two times a day (BID) | ORAL | Status: DC
  Administered 2014-05-16 – 2014-05-19 (×6): 600 mg via ORAL
  Filled 2014-05-16 (×7): qty 1

## 2014-05-16 MED ORDER — SODIUM CHLORIDE 0.9 % IV SOLN
INTRAVENOUS | Status: DC
  Administered 2014-05-16: 22:00:00 via INTRAVENOUS

## 2014-05-16 MED ORDER — SODIUM CHLORIDE 0.9 % IV SOLN: INTRAVENOUS | Status: DC

## 2014-05-16 MED ORDER — METOPROLOL TARTRATE 25 MG PO TABS: 25.0000 mg | ORAL_TABLET | Freq: Four times a day (QID) | ORAL | Status: DC

## 2014-05-16 MED ORDER — DILTIAZEM HCL 100 MG IV SOLR
5.0000 mg/h | INTRAVENOUS | Status: DC
  Administered 2014-05-16: 5 mg/h via INTRAVENOUS
  Administered 2014-05-16: 20 mg/h via INTRAVENOUS
  Administered 2014-05-16 (×2): 15 mg/h via INTRAVENOUS

## 2014-05-16 MED ORDER — SODIUM CHLORIDE 0.9 % IJ SOLN: 3.0000 mL | Freq: Two times a day (BID) | INTRAMUSCULAR | Status: DC

## 2014-05-16 MED ORDER — METOPROLOL TARTRATE 1 MG/ML IV SOLN
5.0000 mg | Freq: Once | INTRAVENOUS | Status: AC
  Administered 2014-05-16: 5 mg via INTRAVENOUS
  Filled 2014-05-16: qty 5

## 2014-05-16 MED ORDER — SODIUM CHLORIDE 0.9 % IJ SOLN: 3.0000 mL | INTRAMUSCULAR | Status: DC | PRN

## 2014-05-16 MED ORDER — DILTIAZEM LOAD VIA INFUSION
20.0000 mg | Freq: Once | INTRAVENOUS | Status: AC
  Administered 2014-05-16: 20 mg via INTRAVENOUS

## 2014-05-16 MED ORDER — INFLUENZA VAC SPLIT QUAD 0.5 ML IM SUSY
0.5000 mL | PREFILLED_SYRINGE | INTRAMUSCULAR | Status: AC
  Administered 2014-05-17: 0.5 mL via INTRAMUSCULAR
  Filled 2014-05-16: qty 0.5

## 2014-05-16 MED ORDER — METOPROLOL TARTRATE 25 MG PO TABS
25.0000 mg | ORAL_TABLET | Freq: Two times a day (BID) | ORAL | Status: DC
  Administered 2014-05-16 – 2014-05-17 (×2): 25 mg via ORAL
  Filled 2014-05-16 (×3): qty 1

## 2014-05-16 MED ORDER — ADENOSINE 6 MG/2ML IV SOLN
INTRAVENOUS | Status: AC
  Administered 2014-05-16: 12 mg
  Filled 2014-05-16: qty 4

## 2014-05-16 MED ORDER — HYDROCODONE-ACETAMINOPHEN 5-325 MG PO TABS
1.0000 | ORAL_TABLET | ORAL | Status: DC | PRN
  Administered 2014-05-16 – 2014-05-19 (×3): 2 via ORAL
  Filled 2014-05-16 (×3): qty 2

## 2014-05-16 MED ORDER — ACETAMINOPHEN 325 MG PO TABS
650.0000 mg | ORAL_TABLET | Freq: Four times a day (QID) | ORAL | Status: DC | PRN
  Administered 2014-05-16: 650 mg via ORAL
  Filled 2014-05-16: qty 2

## 2014-05-16 NOTE — Progress Notes (Signed)
Patient arrived to Unit at 2120 via [facility] from [facility] ED. Patient on Cardizem gtt at 20mg /hr and Heparin gtt at 950units.  Patient stated she has felt bad all day. Admitting team notified of admission and Cardiology team notified as well. Orders released and will monitor pt closely. VSS documented.

## 2014-05-16 NOTE — Progress Notes (Signed)
Dr. Zachery Conch called about pt's HR sustaining 140's. Patient currently on Cardizem gtt at 20mg /hr max dose. Dr. Zachery Conch stated having someone this age is hard to get HR under control.  Watch patient thru the night and plan on doing cardioversion in AM. Keep patient NPO after midnight. Will monitor closely.

## 2014-05-16 NOTE — ED Provider Notes (Signed)
CSN: [id number]     Arrival date & time 05/16/14  5625 History   First MD Initiated Contact with Patient 05/16/14 0818     Chief Complaint  Patient presents with  . Shortness of Breath  . Cough     (Consider location/radiation/quality/duration/timing/severity/associated sxs/prior Treatment) HPI Comments: 28 year old female, history of present hypertension, history of childbirth 4 months ago by C-section who has been doing well until last week when she started having a cough, nasal congestion and a sore throat. She notes that on her way to work this morning she developed significant weakness and some shortness of breath which has been persistent, nothing makes this better or worse, not associated with chest pain, denies swelling of her legs, denies fevers. She had been using TheraFlu medication but denies any other over-the-counter medications or stimulants. She does not have any recent alcohol use, denies cocaine use, and states that her cold symptoms have been resolving until last night when she had increased cough. The symptoms are persistent, nothing makes it better or worse.  Patient is a 28 y.o. female presenting with shortness of breath and cough. The history is provided by the patient.  Shortness of Breath Associated symptoms: cough   Cough Associated symptoms: shortness of breath     Past Medical History  Diagnosis Date  . Hx of migraines   . Hyperlipidemia   . Allergy   . History of chicken pox   . Headache(784.0)   . Infection     trich   Past Surgical History  Procedure Laterality Date  . Adenoidectomy    . Dilation and curettage of uterus    . Cesarean section N/A 01/17/2014    Procedure: CESAREAN SECTION;  Surgeon: Brock Bad, MD;  Location: [facility];  Service: Obstetrics;  Laterality: N/A;   Family History  Problem Relation Age of Onset  . Hypertension Mother   . Hypertension Father   . Hypertension Maternal Grandmother   . Hypertension Maternal Grandfather    . Hypertension Paternal Grandmother   . Hypertension Paternal Grandfather   . Heart attack Mother     received stent in her early 41s   History  Substance Use Topics  . Smoking status: Former Games developer  . Smokeless tobacco: Never Used     Comment: stopped with positive preg  test  . Alcohol Use: Yes   OB History   Grav Para Term Preterm Abortions TAB SAB Ect Mult Living   3 1 1  0 2 0 2 0 0 1     Review of Systems  Respiratory: Positive for cough and shortness of breath.   All other systems reviewed and are negative.     Allergies  Review of patient's allergies indicates no known allergies.  Home Medications   Prior to Admission medications   Not on File   BP 118/81  Pulse 99  Temp(Src) 98.7 F (37.1 C) (Oral)  Resp 24  SpO2 94%  LMP 05/08/2014 Physical Exam  Nursing note and vitals reviewed. Constitutional: She appears well-developed and well-nourished. No distress.  HENT:  Head: Normocephalic and atraumatic.  Mouth/Throat: Oropharynx is clear and moist. No oropharyngeal exudate.  Eyes: Conjunctivae and EOM are normal. Pupils are equal, round, and reactive to light. Right eye exhibits no discharge. Left eye exhibits no discharge. No scleral icterus.  Neck: Normal range of motion. Neck supple. No JVD present. No thyromegaly present.  Cardiovascular: Normal heart sounds, intact distal pulses and normal pulses.  Tachycardia present.  Exam reveals  no gallop and no friction rub.   No murmur heard. Pulses:      Radial pulses are 2+ on the right side, and 2+ on the left side.  Pulmonary/Chest: Effort normal and breath sounds normal. No respiratory distress. She has no wheezes. She has no rales.  Abdominal: Soft. Bowel sounds are normal. She exhibits no distension and no mass. There is no tenderness.  Musculoskeletal: Normal range of motion. She exhibits no edema and no tenderness.  Lymphadenopathy:    She has no cervical adenopathy.  Neurological: She is alert.  Coordination normal.  Skin: Skin is warm and dry. No rash noted. No erythema.  Psychiatric: She has a normal mood and affect. Her behavior is normal.    ED Course  Procedures (including critical care time) Labs Review Labs Reviewed  COMPREHENSIVE METABOLIC PANEL - Abnormal; Notable for the following:    Glucose, Bld 101 (*)    ALT 42 (*)    GFR calc non Af Amer 69 (*)    GFR calc Af Amer 80 (*)    All other components within normal limits  CBC  TROPONIN I  URINE RAPID DRUG SCREEN (HOSP PERFORMED)  ETHANOL    Imaging Review Dg Chest Port 1 View  05/16/2014   CLINICAL DATA:  28 year old female with chest heaviness, chest pain, shortness of breath starting last night. Recent cold. Supraventricular tachycardia. Initial encounter.  EXAM: PORTABLE CHEST - 1 VIEW  COMPARISON:  09/25/2010.  FINDINGS: Portable AP semi upright view at 0856 hrs. Large body habitus. There is mildly asymmetric indistinct pulmonary opacity at the right lung base, but I favor overlying soft tissue attenuation artifact. Normal cardiac size and mediastinal contours. Visualized tracheal air column is within normal limits. No pneumothorax, pulmonary edema or pleural effusion.  IMPRESSION: Subtle asymmetric increased lung density at the right base, but favor artifact due to large body habitus. Recommend followup Standing PA and lateral views when possible.   Electronically Signed   By: Augusto GambleLee  Hall M.D.   On: 05/16/2014 09:09     EKG Interpretation   Date/Time:  Thursday May 16 2014 08:30:56 EDT Ventricular Rate:  154 PR Interval:  88 QRS Duration: 164 QT Interval:  336 QTC Calculation: 538 R Axis:   66 Text Interpretation:  atrial flutter with 2:1 block Non-specific  intra-ventricular conduction block Abnormal ECG Since last tracing rate  faster now with aflutter Confirmed by Kamri Gotsch  MD, Nadiah Corbit ([id number]) on  05/16/2014 8:42:37 AM      MDM   Final diagnoses:  Atrial flutter with rapid ventricular response     The patient likely has atrial flutter with 21 AV block. Immediately vagal maneuvers were attempted to rule out SVT, of note the patient has a history of intraventricular conduction delay from 2012. The morphology of the QRS segment appears similar to date. Her rhythm was very stable at 155 beats per minute, vagal maneuvers did not work. Attempted adenosine 12 mg times one which uncovered underlying flutter waves and not SVT. The patient did not have any successful conversion and still maintains a pulse of 156. Her blood pressure is normal, she does not appear to be in distress either respiratory or in any other way. Check a chest x-ray, labs, Cardizem with a drip.  10:00 AM - no significant change on Cardizem with bolus of 20mg  and drip at 15mg  / h - still at 155, will add BB.  Pt clinical condition has not changed, no hypotension.  4:15 PM, no significant  conversion of the rhythm however the patient has had a significant slowing of her rhythm after Cardizem drip, metoprolol bolus, has not been seen by cardiology who recommends that the patient should be anticoagulated and set up for a transesophageal echocardiogram with cardioversion to be done tomorrow. Dr. Mayford Knife was cardiology has requested IM admission at [facility], the patient will require transfer.  Meds given in ED:  Medications  diltiazem (CARDIZEM) 1 mg/mL load via infusion 20 mg (0 mg Intravenous Stopped 05/16/14 0848)    And  diltiazem (CARDIZEM) 100 mg in dextrose 5 % 100 mL (1 mg/mL) infusion (15 mg/hr Intravenous New Bag/Given 05/16/14 1505)  heparin bolus via infusion 5,000 Units (not administered)  heparin ADULT infusion 100 units/mL (25000 units/250 mL) (not administered)  adenosine (ADENOCARD) 6 MG/2ML injection (12 mg  Given 05/16/14 0836)  metoprolol (LOPRESSOR) injection 5 mg (5 mg Intravenous Given 05/16/14 1022)     CRITICAL CARE Performed by: Eber Hong D Total critical care time: 35 Critical care time was  exclusive of separately billable procedures and treating other patients. Critical care was necessary to treat or prevent imminent or life-threatening deterioration. Critical care was time spent personally by me on the following activities: development of treatment plan with patient and/or surrogate as well as nursing, discussions with consultants, evaluation of patient's response to treatment, examination of patient, obtaining history from patient or surrogate, ordering and performing treatments and interventions, ordering and review of laboratory studies, ordering and review of radiographic studies, pulse oximetry and re-evaluation of patient's condition.   Vida Roller, MD 05/17/14 [phone]

## 2014-05-16 NOTE — ED Notes (Addendum)
This RN received a call from the [employer].  She reported that it is time for her to leave and that she will be unable to perform the test.  Sts that even if she stayed there would be no Cardiologist to read it.  She asked that this RN notify the Cardiologist that she(the cardiologist) needs to page the on-call Echo person.  This RN has asked the Secretary to page Turner MD.

## 2014-05-16 NOTE — Consult Note (Signed)
Patient ID: Tracy Grant MRN: [id number], DOB/AGE: Mar 04, 1986   Admit date: 05/16/2014   Primary Physician: Nicki Reaper, NP Primary Cardiologist: New   Pt. Profile:  28 year old African American female with past medical history of hyperlipidemia and pre-hypertension however not on any home medication presented with tachycardia  Problem List  Past Medical History  Diagnosis Date  . Hx of migraines   . Hyperlipidemia   . Allergy   . History of chicken pox   . Headache(784.0)   . Infection     trich    Past Surgical History  Procedure Laterality Date  . Adenoidectomy    . Dilation and curettage of uterus    . Cesarean section N/A 01/17/2014    Procedure: CESAREAN SECTION;  Surgeon: Brock Bad, MD;  Location: [facility];  Service: Obstetrics;  Laterality: N/A;     Allergies  No Known Allergies  HPI  The patient is a 28 year old [place] American female with past medical history of hyperlipidemia and pre-hypertension however not on any home medication. She denies any prior cardiac history, although her mother did have a heart attack in her early 88s and received a stent. She was recently given birth in June 2015. She states her pregnancy was uncomplicated and she eventually underwent cesarean section and received a healthy baby. Her delivery was also uncomplicated. Since her delivery, patient has been noticing intermittent palpation episodes lasting seconds to minutes. She denies any exertional chest pain. She states she drank one cup of coffee and one bottle of [facility] every day. She denies any prior palpation before her delivery.  Over a month ago, she had a bad upper respiratory infection. She had some fever, chill, runny nose, cough. The initial symptom lasted one week before recovering, however her cough persisted for the past month. She denies any recent lower extremity edema, orthopnea or paroxysmal nocturnal dyspnea. She states last night, she was unable to  sleep due to recurrent cough, she also has some shortness of breath when laying flat on her back and that was the first time she noticed something exist. She presented [facility] ED in the morning of 05/16/2014 for congestion related complaints. Of note, her cough has increased since the night of 05/15/2014, she had one episode of posttussive vomiting in the morning of hospital visit. However on initial examination, it was noted her heart rate was tachycardic into the 150s.   On initial arrival, her blood pressure was 140/103. O2 saturation 97% on room air. Heart rate in the 150s. Initial temperature 100. Initial chest x-ray showed asymmetric indistinct pulmonary ICD in the left lung base, however favor soft tissue attenuation artifact. Standing PA and lateral view was recommended for followup. Initial EKG was read as sinus tachycardia with heart rate in the 150s. Repeat EKG however indicate 2:1 atrial flutter. Do to the difficulty distinguishing SVT versus dig 1 atrial flutter and fast heart rate, attempt were made to slow her heart rate down. Patient were asked to bear down and perform carotid massage. She was given 12 mg of adenosine and 5 mg IV Lopressor without noticeable decrease in heart rate. Patient was eventually started on diltiazem drip. Per nursing staff, her heart rate temporarily slowed down to 110s range and had atrial flutter appears. Cardiology has been consulted for atrial flutter.   Home Medications  Prior to Admission medications   Not on File    Family History  Family History  Problem Relation Age of Onset  .  Hypertension Mother   . Hypertension Father   . Hypertension Maternal Grandmother   . Hypertension Maternal Grandfather   . Hypertension Paternal Grandmother   . Hypertension Paternal Grandfather     Social History  History   Social History  . Marital Status: Single    Spouse Name: N/A    Number of Children: 0  . Years of Education: 12+   Occupational  History  .  [employer]   Social History Main Topics  . Smoking status: Former Games developer  . Smokeless tobacco: Never Used     Comment: stopped with positive preg  test  . Alcohol Use: Yes  . Drug Use: No  . Sexual Activity: Not Currently    Birth Control/ Protection: None   Other Topics Concern  . Not on file   Social History Narrative   Regular exercise-no   Caffeine Use-yes     Review of Systems General:  No night sweats or weight changes. +chills, fever over 1 month ago Cardiovascular:  No chest pain, dyspnea on exertion, edema, orthopnea, paroxysmal nocturnal dyspnea. +palpitations Dermatological: No rash, lesions/masses Respiratory: No cough, dyspnea Urologic: No hematuria, dysuria Abdominal:   No nausea, diarrhea, bright red blood per rectum, melena, or hematemesis. + vomiting Neurologic:  No visual changes, wkns, changes in mental status. All other systems reviewed and are otherwise negative except as noted above.  Physical Exam  Blood pressure 138/99, pulse 151, temperature 99.9 F (37.7 C), temperature source Oral, resp. rate 18, last menstrual period 05/08/2014, SpO2 99.00%, currently breastfeeding.  General: Pleasant, NAD Psych: Normal affect. Neuro: Alert and oriented X 3. Moves all extremities spontaneously. HEENT: Normal  Neck: Supple without bruits or JVD. Lungs:  Resp regular and unlabored, CTA. Heart: tachycardic, no s3, s4, or murmurs. Abdomen: Soft, non-tender, non-distended, BS + x 4.  Extremities: No clubbing, cyanosis or edema. DP/PT/Radials 2+ and equal bilaterally.  Labs  Troponin [facility] of Care Test) No results found for this basename: TROPIPOC,  in the last 72 hours  Recent Labs  05/16/14 0839  TROPONINI <0.30   Lab Results  Component Value Date   WBC 7.5 05/16/2014   HGB 13.2 05/16/2014   HCT 38.4 05/16/2014   MCV 92.8 05/16/2014   PLT 268 05/16/2014    Recent Labs Lab 05/16/14 0839  NA 142  K 4.3  CL 106  CO2 25  BUN 11    CREATININE 1.10  CALCIUM 9.2  PROT 7.4  BILITOT 0.4  ALKPHOS 63  ALT 42*  AST 32  GLUCOSE 101*   Lab Results  Component Value Date   CHOL 156 08/08/2012   HDL 34.90* 08/08/2012   LDLCALC 82 08/08/2012   TRIG 198.0* 08/08/2012   No results found for this basename: DDIMER     Radiology/Studies  Dg Chest Port 1 View  05/16/2014   CLINICAL DATA:  28 year old female with chest heaviness, chest pain, shortness of breath starting last night. Recent cold. Supraventricular tachycardia. Initial encounter.  EXAM: PORTABLE CHEST - 1 VIEW  COMPARISON:  09/25/2010.  FINDINGS: Portable AP semi upright view at 0856 hrs. Large body habitus. There is mildly asymmetric indistinct pulmonary opacity at the right lung base, but I favor overlying soft tissue attenuation artifact. Normal cardiac size and mediastinal contours. Visualized tracheal air column is within normal limits. No pneumothorax, pulmonary edema or pleural effusion.  IMPRESSION: Subtle asymmetric increased lung density at the right base, but favor artifact due to large body habitus. Recommend followup Standing PA and  lateral views when possible.   Electronically Signed   By: Augusto GambleLee  Hall M.D.   On: 05/16/2014 09:09    ECG  A. flutter with 2:1 conduction   ASSESSMENT AND PLAN  1. Tachycardia conerning for a-flutter vs SVT  - did not respond to vagal maneuver, adenosine, and IV lopressor. IV diltiazem initiated, continue to be elevated in 140s  - Telemetry reviewed, at 14:27pm, HR 117, it appears underlying rhythm is a-fluter, which explain why she did not respond to adenosine and vagal maneuver  - occurred in the setting of recent URI and recent birth, low grade fever on arrival with ?CXR, will check with MD to see if should be admitted by Internal Med service  - check echo, does not have the appearance of peripartum cardiomyopathy however need to r/o  - continue diltiazem gtt, increase to 20. Possibly adding low dose metoprolol at BP  tolerates.   - Consider add heparin gtt, and plan for TEE with cardioversion if does not convert on its own.   - check TSH and [facility] to [facility]  2. Recent URI: WBC normal, low grade fever  - consider repeat CXR standing PA/lateral  3. HLD 4. Pre-HTN      Signed, Azalee CourseMeng, Dyonna Jaspers, PA-C 05/16/2014, 3:04 PM

## 2014-05-16 NOTE — ED Notes (Signed)
Pt c/o SOB, weakness, and cough starting last night.  Denies pain.  Pt reports she has recently had a cold, but symptoms have resolved.  Pt reports taking Theraflu, but last dose x 3 weeks ago.    Pt presents in SVT.

## 2014-05-16 NOTE — Progress Notes (Signed)
  Echocardiogram 2D Echocardiogram has been performed.  Tracy Grant M 05/16/2014, 8:33 PM

## 2014-05-16 NOTE — Consult Note (Signed)
I have personally interviewed and examined the patient and database reviewed.  She has had URI symptoms for a few weeks with cough and also has had intermittent palpitations since her delivery.  Admitted with cough and found to be in atrial flutter at 150bpm resistent to high dose Cardizem gtt. Currently HR 150bpm on IV Cardizem at 15mg /hr.  Will rebolus with 10mg  IV and increase gtt to 20mg /hr.  She has no evidence of CHF on exam.  Fever may be driving this although only low grade.  Unclear how long she has been in atrial flutter.  Will transfer to [facility] on hospitalist service to workup fever and cough.  Chest xray should be repeated to evaluate abnormality further.  Check TSH and 2D echo to assess LVF.  Add low dose Lopressor and titrated for HR control.  Will make NPO after MN for TEE/DCCV in am.  Procedure has been discussed with patient including risk of MI/CVA/death/aspiration/risk of anesthesia/bradyarrhythmias requiring temporary pacing and esophageal perforation and patient is willing to proceed.

## 2014-05-16 NOTE — H&P (Signed)
Patient Demographics  Corry Storie, is a 28 y.o. female  MRN: [id number]   DOB - 26-Nov-1985  Admit Date - 05/16/2014  Outpatient Primary MD for the patient is Nicki Reaper, NP   With History of -  Past Medical History  Diagnosis Date  . Hx of migraines   . Hyperlipidemia   . Allergy   . History of chicken pox   . Headache(784.0)   . Infection     trich      Past Surgical History  Procedure Laterality Date  . Adenoidectomy    . Dilation and curettage of uterus    . Cesarean section N/A 01/17/2014    Procedure: CESAREAN SECTION;  Surgeon: Brock Bad, MD;  Location: [facility];  Service: Obstetrics;  Laterality: N/A;    in for   Chief Complaint  Patient presents with  . Shortness of Breath  . Cough     HPI  Alwilda Gilland  is a 28 y.o. female, with significant past medical history of hyperlipidemia and pre-hypertension, before 4 months patient had a childbirth, with healthy boy, she had no complication after labor, patient reports that for the last 48 hours she has been complaining of fever chills weakness cough productive sputum and nasal discharge, and generalized weakness, reports it this morning she started to have feeling of the shortness of breath, and mild palpitation, upon presentation to ED patient was found to be tachycardic, which did not respond to cycle maneuver, adenosine, IV Lopressor, patient was started on IV Cardizem drip, which slowed her heart rate to the 120s to 140s rate, where patient was noticed to be in a flutter rhythm, with almost 2 to 1 block, ED contacted cardiology, who requested the patient to be admitted to hospitalist service given the fact she has upper respiratory infection, the plan as per cardiology is to transfer her to [facility] where he is already started on heparin drip, continue with the Cardizem drip, and for TEE in the morning with cardioversion if she remains in a flutter:    Review of Systems    In  addition to the HPI above, Report Fever-chills, No Headache, No changes with Vision or hearing, No problems swallowing food or Liquids, No Chest pain, complaints of Cough and Shortness of Breath, No Abdominal pain, No Nausea or Vommitting, Bowel movements are regular, No Blood in stool or Urine, No dysuria, No new skin rashes or bruises, No new joints pains-aches,  No new weakness, tingling, numbness in any extremity, No recent weight gain or loss, No polyuria, polydypsia or polyphagia, No significant Mental Stressors.  A full 10 point Review of Systems was done, except as stated above, all other Review of Systems were negative.   Social History History  Substance Use Topics  . Smoking status: Former Games developer  . Smokeless tobacco: Never Used     Comment: stopped with positive preg  test  . Alcohol Use: Yes     Family History Family History  Problem Relation Age of Onset  . Hypertension Mother   . Hypertension Father   . Hypertension Maternal Grandmother   . Hypertension Maternal Grandfather   . Hypertension Paternal Grandmother   . Hypertension Paternal Grandfather   . Heart attack Mother     received stent in her early 51s     Prior to Admission medications   Not on File    No Known Allergies  Physical Exam  Vitals  Blood pressure 118/81, pulse 99,  temperature 98.7 F (37.1 C), temperature source Oral, resp. rate 24, height 5\' 6"  (1.676 m), weight 106.958 kg (235 lb 12.8 oz), last menstrual period 05/08/2014, SpO2 94.00%, currently breastfeeding.   1. General well-nourished female lying in bed in NAD,    2. anxious ,Normal affect and insight, Not Suicidal or Homicidal, Awake Alert, Oriented X 3.  3. No F.N deficits, ALL C.Nerves Intact, Strength 5/5 all 4 extremities, Sensation intact all 4 extremities, Plantars down going.  4. Ears and Eyes appear Normal, Conjunctivae clear, PERRLA. Moist Oral Mucosa.  5. Supple Neck, No JVD, No cervical lymphadenopathy  appriciated, No Carotid Bruits.  6. Symmetrical Chest wall movement, Good air movement bilaterally, CTAB. Has runny nose, no pharyngeal or throat erythema.  7. tachycardia, No Gallops, Rubs or Murmurs, No Parasternal Heave.  8. Positive Bowel Sounds, Abdomen Soft, No tenderness, No organomegaly appriciated,No rebound -guarding or rigidity.  9.  No Cyanosis, Normal Skin Turgor, No Skin Rash or Bruise.  10. Good muscle tone,  joints appear normal , no effusions, Normal ROM.  11. No Palpable Lymph Nodes in Neck or Axillae    Data Review  CBC  Recent Labs Lab 05/16/14 0839  WBC 7.5  HGB 13.2  HCT 38.4  PLT 268  MCV 92.8  MCH 31.9  MCHC 34.4  RDW 12.4   ------------------------------------------------------------------------------------------------------------------  Chemistries   Recent Labs Lab 05/16/14 0839  NA 142  K 4.3  CL 106  CO2 25  GLUCOSE 101*  BUN 11  CREATININE 1.10  CALCIUM 9.2  AST 32  ALT 42*  ALKPHOS 63  BILITOT 0.4   ------------------------------------------------------------------------------------------------------------------ estimated creatinine clearance is 96.8 ml/min (by C-G formula based on Cr of 1.1). ------------------------------------------------------------------------------------------------------------------ No results found for this basename: TSH, T4TOTAL, FREET3, T3FREE, THYROIDAB,  in the last 72 hours   Coagulation profile No results found for this basename: INR, PROTIME,  in the last 168 hours ------------------------------------------------------------------------------------------------------------------- No results found for this basename: DDIMER,  in the last 72 hours -------------------------------------------------------------------------------------------------------------------  Cardiac Enzymes  Recent Labs Lab 05/16/14 0839  TROPONINI <0.30    ------------------------------------------------------------------------------------------------------------------ No components found with this basename: POCBNP,    ---------------------------------------------------------------------------------------------------------------  Urinalysis    Component Value Date/Time   COLORURINE YELLOW 01/16/2014 1115   APPEARANCEUR CLEAR 01/16/2014 1115   LABSPEC 1.010 01/16/2014 1115   PHURINE 6.0 01/16/2014 1115   GLUCOSEU NEGATIVE 01/16/2014 1115   HGBUR MODERATE* 01/16/2014 1115   BILIRUBINUR NEGATIVE 01/16/2014 1115   BILIRUBINUR NEGATIVE 12/13/2013 1052   KETONESUR >80* 01/16/2014 1115   PROTEINUR NEGATIVE 01/16/2014 1115   PROTEINUR trace 01/16/2014 1026   UROBILINOGEN 0.2 01/16/2014 1115   UROBILINOGEN negative 12/13/2013 1052   NITRITE NEGATIVE 01/16/2014 1115   NITRITE neg 01/16/2014 1026   LEUKOCYTESUR MODERATE* 01/16/2014 1115    ----------------------------------------------------------------------------------------------------------------  Imaging results:   Dg Chest Port 1 View  05/16/2014   CLINICAL DATA:  28 year old female with chest heaviness, chest pain, shortness of breath starting last night. Recent cold. Supraventricular tachycardia. Initial encounter.  EXAM: PORTABLE CHEST - 1 VIEW  COMPARISON:  09/25/2010.  FINDINGS: Portable AP semi upright view at 0856 hrs. Large body habitus. There is mildly asymmetric indistinct pulmonary opacity at the right lung base, but I favor overlying soft tissue attenuation artifact. Normal cardiac size and mediastinal contours. Visualized tracheal air column is within normal limits. No pneumothorax, pulmonary edema or pleural effusion.  IMPRESSION: Subtle asymmetric increased lung density at the right base, but favor artifact due to large body habitus. Recommend  followup Standing PA and lateral views when possible.   Electronically Signed   By: Augusto Gamble M.D.   On: 05/16/2014 09:09     EKG showing a flutter with 2:1  block, heart rate 154   Assessment & Plan  Active Problems:   Atrial flutter   Fever   Abnormal chest x-ray   Cough   Atrial flutter with rapid ventricular response    1. a flutter with RVR: -Patient will be admitted to step down at [facility], as per cardiology recommendation we'll continue with Cardizem drip, we'll continue with heparin drip as well, and if hypertension uncontrolled we'll add low-dose metoprolol. -The plan is for the patient to have TEE with cardioversion in a.m. as per cardiology. -Check TSH -Continue to cycle cardiac, and check echo as per cardiology recommendation.  2. URI: Start patient on azithromycin.    DVT Prophylaxis Heparin Gtt SCDs   AM Labs Ordered, also please review Full Orders  Family Communication: Admission, patients condition and plan of care including tests being ordered have been discussed with the patient  who indicate understanding and agree with the plan and Code Status.(Patient was offered I can contact any family member and inform and updated about her medical condition but at this point she did not want me to)  Code Status Full code  Likely DC to home  Condition GUARDED    Time spent in minutes :45 minutes    ELGERGAWY, DAWOOD M.D on 05/16/2014 at 4:56 PM  Between 7am to 7pm - Pager - [phone]  After 7pm go to [email or web address] - password [email or web address]  And look for the night coverage person covering me after hours  [facility] Office  [phone]   **Disclaimer: This note may have been dictated with voice recognition software. Similar sounding words can inadvertently be transcribed and this note may contain transcription errors which may not have been corrected upon publication of note.**

## 2014-05-17 ENCOUNTER — Inpatient Hospital Stay (HOSPITAL_COMMUNITY): Payer: Medicaid Other

## 2014-05-17 DIAGNOSIS — I059 Rheumatic mitral valve disease, unspecified: Secondary | ICD-10-CM

## 2014-05-17 DIAGNOSIS — J209 Acute bronchitis, unspecified: Secondary | ICD-10-CM

## 2014-05-17 DIAGNOSIS — Z720 Tobacco use: Secondary | ICD-10-CM

## 2014-05-17 LAB — BASIC METABOLIC PANEL
ANION GAP: 11 (ref 5–15)
BUN: 6 mg/dL (ref 6–23)
CALCIUM: 8.9 mg/dL (ref 8.4–10.5)
CO2: 25 mEq/L (ref 19–32)
Chloride: 102 mEq/L (ref 96–112)
Creatinine, Ser: 0.92 mg/dL (ref 0.50–1.10)
GFR, EST NON AFRICAN AMERICAN: 86 mL/min — AB (ref >90)
Glucose, Bld: 90 mg/dL (ref 70–99)
POTASSIUM: 3.7 meq/L (ref 3.7–5.3)
SODIUM: 138 meq/L (ref 137–147)

## 2014-05-17 LAB — TROPONIN I

## 2014-05-17 LAB — CBC
HCT: 34.9 % — ABNORMAL LOW (ref 36.0–46.0)
Hemoglobin: 12 g/dL (ref 12.0–15.0)
MCH: 31.8 pg (ref 26.0–34.0)
MCHC: 34.4 g/dL (ref 30.0–36.0)
MCV: 92.6 fL (ref 78.0–100.0)
PLATELETS: 210 10*3/uL (ref 150–400)
RBC: 3.77 MIL/uL — AB (ref 3.87–5.11)
RDW: 12.6 % (ref 11.5–15.5)
WBC: 6.2 10*3/uL (ref 4.0–10.5)

## 2014-05-17 LAB — INFLUENZA PANEL BY PCR (TYPE A & B)
H1N1 flu by pcr: NOT DETECTED
INFLAPCR: NEGATIVE
INFLBPCR: NEGATIVE

## 2014-05-17 LAB — HEPARIN LEVEL (UNFRACTIONATED)
HEPARIN UNFRACTIONATED: 0.13 [IU]/mL — AB (ref 0.30–0.70)
Heparin Unfractionated: <0.1 IU/mL — ABNORMAL LOW (ref 0.30–0.70)

## 2014-05-17 LAB — TSH: TSH: 0.515 u[IU]/mL (ref 0.350–4.500)

## 2014-05-17 MED ORDER — DEXTROSE 5 % IV SOLN
2.0000 g | INTRAVENOUS | Status: DC
  Administered 2014-05-17: 2 g via INTRAVENOUS
  Filled 2014-05-17 (×2): qty 2

## 2014-05-17 MED ORDER — FENTANYL CITRATE 0.05 MG/ML IJ SOLN
INTRAMUSCULAR | Status: AC
  Filled 2014-05-17: qty 4

## 2014-05-17 MED ORDER — FENTANYL CITRATE 0.05 MG/ML IJ SOLN
INTRAMUSCULAR | Status: AC
  Filled 2014-05-17: qty 2

## 2014-05-17 MED ORDER — SODIUM CHLORIDE 0.9 % IV SOLN: INTRAVENOUS | Status: DC

## 2014-05-17 MED ORDER — MORPHINE SULFATE 2 MG/ML IJ SOLN
2.0000 mg | INTRAMUSCULAR | Status: DC | PRN
  Administered 2014-05-17 (×2): 2 mg via INTRAVENOUS
  Filled 2014-05-17 (×2): qty 1

## 2014-05-17 MED ORDER — HYDROCORTISONE 1 % EX CREA
1.0000 "application " | TOPICAL_CREAM | Freq: Three times a day (TID) | CUTANEOUS | Status: DC | PRN
  Filled 2014-05-17: qty 28

## 2014-05-17 MED ORDER — MIDAZOLAM HCL 2 MG/2ML IJ SOLN
4.0000 mg | Freq: Once | INTRAMUSCULAR | Status: AC
  Administered 2014-05-17: 4 mg via INTRAVENOUS

## 2014-05-17 MED ORDER — FENTANYL CITRATE 0.05 MG/ML IJ SOLN
125.0000 ug | Freq: Once | INTRAMUSCULAR | Status: AC
  Administered 2014-05-17: 125 ug via INTRAVENOUS

## 2014-05-17 MED ORDER — MIDAZOLAM HCL 2 MG/2ML IJ SOLN
INTRAMUSCULAR | Status: AC
  Filled 2014-05-17: qty 2

## 2014-05-17 MED ORDER — RIVAROXABAN 20 MG PO TABS
20.0000 mg | ORAL_TABLET | Freq: Every day | ORAL | Status: DC
  Administered 2014-05-17 – 2014-05-19 (×3): 20 mg via ORAL
  Filled 2014-05-17 (×3): qty 1

## 2014-05-17 MED ORDER — METOPROLOL TARTRATE 25 MG PO TABS
25.0000 mg | ORAL_TABLET | Freq: Four times a day (QID) | ORAL | Status: DC
  Administered 2014-05-17 – 2014-05-19 (×8): 25 mg via ORAL
  Filled 2014-05-17 (×10): qty 1

## 2014-05-17 MED ORDER — MIDAZOLAM HCL 2 MG/2ML IJ SOLN
INTRAMUSCULAR | Status: AC
  Filled 2014-05-17: qty 4

## 2014-05-17 NOTE — Progress Notes (Signed)
ANTIBIOTIC CONSULT NOTE - INITIAL  Pharmacy Consult for Ceftriaxone Indication:  CAP  No Known Allergies  Patient Measurements: Height: 5\' 6"  (167.6 cm) Weight: 235 lb 12.8 oz (106.958 kg) IBW/kg (Calculated) : 59.3  Vital Signs: Temp: 98.2 F (36.8 C) (10/02 1207) Temp Source: Oral (10/02 1207) BP: 124/82 mmHg (10/02 1400) Pulse Rate: 77 (10/02 1400) Intake/Output from previous day: 10/01 0701 - 10/02 0700 In: 912.9 [I.V.:912.9] Out: 650 [Urine:650] Intake/Output from this shift: Total I/O In: 806.5 [P.O.:480; I.V.:326.5] Out: -   Labs:  Recent Labs  05/16/14 0839 05/17/14 0312  WBC 7.5 6.2  HGB 13.2 12.0  PLT 268 210  CREATININE 1.10 0.92   Estimated Creatinine Clearance: 115.7 ml/min (by C-G formula based on Cr of 0.92). No results found for this basename: VANCOTROUGH, Leodis Binet, VANCORANDOM, GENTTROUGH, GENTPEAK, GENTRANDOM, TOBRATROUGH, TOBRAPEAK, TOBRARND, AMIKACINPEAK, AMIKACINTROU, AMIKACIN,  in the last 72 hours   Microbiology: Recent Results (from the past 720 hour(s))  MRSA PCR SCREENING     Status: None   Collection Time    05/16/14  9:18 PM      Result Value Ref Range Status   MRSA by PCR NEGATIVE  NEGATIVE Final   Comment:            The GeneXpert MRSA Assay (FDA     approved for NASAL specimens     only), is one component of a     comprehensive MRSA colonization     surveillance program. It is not     intended to diagnose MRSA     infection nor to guide or     monitor treatment for     MRSA infections.    Medical History: Past Medical History  Diagnosis Date  . Hx of migraines   . Hyperlipidemia   . Allergy   . History of chicken pox   . Headache(784.0)   . Infection     trich    Medications:  No prescriptions prior to admission   Scheduled:  . azithromycin  500 mg Intravenous Q24H  . cefTRIAXone (ROCEPHIN)  IV  2 g Intravenous Q24H  . guaiFENesin  600 mg Oral BID  . metoprolol tartrate  25 mg Oral QID  . rivaroxaban  20  mg Oral Daily  . sodium chloride  3 mL Intravenous Q12H   Assessment: 28 y.o female with CAP started on azithromycin yesterday.   Significant past medical history of hyperlipidemia and pre-hypertension recently had a childbirth developed URI symptoms (3-4 weeks) with palpitation and found to have A flutter.  Now adding ceftriaxone to antibiotic treatment for CAP.  Renal function is normal. SCr 0.92, CrCl >100 ml/min.  Although ceftriaxone does not require dosage adjustment for renal impairment.    Plan:  Ceftriaxone 2 gm IV q24h  No adjustment necessary for renal function changes, thus pharmacy will sign off for the ceftriaxone consult.   Noah Delaine, RPh Clinical Pharmacist Pager: [phone]/09/2013,3:03 PM

## 2014-05-17 NOTE — Progress Notes (Addendum)
Subjective: No CP  Breathing OK   Objective: Filed Vitals:   05/17/14 0538 05/17/14 0638 05/17/14 0700 05/17/14 0800  BP:   131/81 130/91  Pulse: 103 87 87 92  Temp:   98.4 F (36.9 C)   TempSrc:   Oral   Resp: 18 26 25 18   Height:      Weight:      SpO2: 96% 89% 84% 93%   Weight change:   Intake/Output Summary (Last 24 hours) at 05/17/14 1020 Last data filed at 05/17/14 0800  Gross per 24 hour  Intake 1009.42 ml  Output    650 ml  Net 359.42 ml    General: Alert, awake, oriented x3, in no acute distress Neck:  JVP is normal Heart: Irregular rate and rhythm, without murmurs, rubs, gallops.  Lungs  Rhonchi   Exemities:  No edema.   Neuro: Grossly intact, nonfocal.   Lab Results: Results for orders placed during the hospital encounter of 05/16/14 (from the past 24 hour(s))  URINE RAPID DRUG SCREEN (HOSP PERFORMED)     Status: None   Collection Time    05/16/14 10:59 AM      Result Value Ref Range   Opiates NONE DETECTED  NONE DETECTED   Cocaine NONE DETECTED  NONE DETECTED   Benzodiazepines NONE DETECTED  NONE DETECTED   Amphetamines NONE DETECTED  NONE DETECTED   Tetrahydrocannabinol NONE DETECTED  NONE DETECTED   Barbiturates NONE DETECTED  NONE DETECTED  ETHANOL     Status: None   Collection Time    05/16/14 11:36 AM      Result Value Ref Range   Alcohol, Ethyl (B) <11  0 - 11 mg/dL  TSH     Status: None   Collection Time    05/16/14 11:40 AM      Result Value Ref Range   TSH 0.515  0.350 - 4.500 uIU/mL  APTT     Status: None   Collection Time    05/16/14  4:47 PM      Result Value Ref Range   aPTT 29  24 - 37 seconds  PROTIME-INR     Status: None   Collection Time    05/16/14  4:47 PM      Result Value Ref Range   Prothrombin Time 14.1  11.6 - 15.2 seconds   INR 1.08  0.00 - 1.49  MRSA PCR SCREENING     Status: None   Collection Time    05/16/14  9:18 PM      Result Value Ref Range   MRSA by PCR NEGATIVE  NEGATIVE  INFLUENZA PANEL BY PCR  (TYPE A & B, H1N1)     Status: None   Collection Time    05/16/14 10:33 PM      Result Value Ref Range   Influenza A By PCR NEGATIVE  NEGATIVE   Influenza B By PCR NEGATIVE  NEGATIVE   H1N1 flu by pcr NOT DETECTED  NOT DETECTED  TROPONIN I     Status: None   Collection Time    05/16/14 11:20 PM      Result Value Ref Range   Troponin I <0.30  <0.30 ng/mL  HEPARIN LEVEL (UNFRACTIONATED)     Status: Abnormal   Collection Time    05/16/14 11:25 PM      Result Value Ref Range   Heparin Unfractionated 0.13 (*) 0.30 - 0.70 IU/mL  BASIC METABOLIC PANEL     Status: Abnormal  Collection Time    05/17/14  3:12 AM      Result Value Ref Range   Sodium 138  137 - 147 mEq/L   Potassium 3.7  3.7 - 5.3 mEq/L   Chloride 102  96 - 112 mEq/L   CO2 25  19 - 32 mEq/L   Glucose, Bld 90  70 - 99 mg/dL   BUN 6  6 - 23 mg/dL   Creatinine, Ser 1.610.92  0.50 - 1.10 mg/dL   Calcium 8.9  8.4 - 09.610.5 mg/dL   GFR calc non Af Amer 86 (*) >90 mL/min   GFR calc Af Amer >90  >90 mL/min   Anion gap 11  5 - 15  CBC     Status: Abnormal   Collection Time    05/17/14  3:12 AM      Result Value Ref Range   WBC 6.2  4.0 - 10.5 K/uL   RBC 3.77 (*) 3.87 - 5.11 MIL/uL   Hemoglobin 12.0  12.0 - 15.0 g/dL   HCT 04.534.9 (*) 40.936.0 - 81.146.0 %   MCV 92.6  78.0 - 100.0 fL   MCH 31.8  26.0 - 34.0 pg   MCHC 34.4  30.0 - 36.0 g/dL   RDW 91.412.6  78.211.5 - 95.615.5 %   Platelets 210  150 - 400 K/uL  TROPONIN I     Status: None   Collection Time    05/17/14  3:12 AM      Result Value Ref Range   Troponin I <0.30  <0.30 ng/mL  HEPARIN LEVEL (UNFRACTIONATED)     Status: Abnormal   Collection Time    05/17/14  8:06 AM      Result Value Ref Range   Heparin Unfractionated <0.10 (*) 0.30 - 0.70 IU/mL    Studies/Results: Dg Chest Port 1 View  05/16/2014   CLINICAL DATA:  28 year old female with chest heaviness, chest pain, shortness of breath starting last night. Recent cold. Supraventricular tachycardia. Initial encounter.  EXAM: PORTABLE  CHEST - 1 VIEW  COMPARISON:  09/25/2010.  FINDINGS: Portable AP semi upright view at 0856 hrs. Large body habitus. There is mildly asymmetric indistinct pulmonary opacity at the right lung base, but I favor overlying soft tissue attenuation artifact. Normal cardiac size and mediastinal contours. Visualized tracheal air column is within normal limits. No pneumothorax, pulmonary edema or pleural effusion.  IMPRESSION: Subtle asymmetric increased lung density at the right base, but favor artifact due to large body habitus. Recommend followup Standing PA and lateral views when possible.   Electronically Signed   By: Augusto GambleLee  Hall M.D.   On: 05/16/2014 09:09    Medications: Reviewed   @PROBHOSP @  1.  Atrial flutter.  Patient's HR improved with diltizem Underwent TEE with no evid of LA thrmbus.  Proceeded with cardioversion  Patietn now in SR  12 lead EKG pending  PR interval appears short. Continue heparin  Will consult with pharmacy re anticoagulation D/C dilt  Increase Lopressor    TTE pending  BY TEE  LV function appears to be somewhat depressed  Difficult to assess as windows foreshortened.    LOS: 1 day   Dietrich Patesaula Christalyn Goertz 05/17/2014, 10:20 AM  Addendum:  12 lead EKG with evid of preexcitation.  I spoke to mother  Tracy Grant(Tracy Grant, 28 yo)  She had atrial flutter.  Suffered MI, felt to be embolic  She underwent catheter ablation for flutter.  Did not tolerate anticoag due to menses (required transfusion)  EKG  in SR with short PR, not quite like daughters.  I have discussed with EP who will see patient    Dietrich Pates

## 2014-05-17 NOTE — Progress Notes (Signed)
  Echocardiogram 2D Echocardiogram has been performed.  Tracy Grant 05/17/2014, 5:05 PM

## 2014-05-17 NOTE — Care Management Note (Signed)
    Page 1 of 1   05/17/2014     8:04:06 AM CARE MANAGEMENT NOTE 05/17/2014  Patient:  Tracy Grant, Tracy Grant   Account Number:  [id number]  Date Initiated:  05/17/2014  Documentation initiated by:  Junius Creamer  Subjective/Objective Assessment:   adm w at fib     Action/Plan:   lives w fam, pcp dr Nicki Reaper   Anticipated DC Date:     Anticipated DC Plan:           Choice offered to / List presented to:             Status of service:   Medicare Important Message given?   (If response is "NO", the following Medicare IM given date fields will be blank) Date Medicare IM given:   Medicare IM given by:   Date Additional Medicare IM given:   Additional Medicare IM given by:    Discharge Disposition:    Per UR Regulation:  Reviewed for med. necessity/level of care/duration of stay  If discussed at Long Length of Stay Meetings, dates discussed:    Comments:

## 2014-05-17 NOTE — Progress Notes (Signed)
  Echocardiogram Echocardiogram Transesophageal has been performed.  Tracy Grant 05/17/2014, 3:15 PM

## 2014-05-17 NOTE — Progress Notes (Signed)
ANTICOAGULATION CONSULT NOTE  Pharmacy Consult for Xarelto Indication: atrial flutter, new onset  No Known Allergies  Patient Measurements: Height: 5\' 6"  (167.6 cm) Weight: 235 lb 12.8 oz (106.958 kg) IBW/kg (Calculated) : 59.3   Vital Signs: Temp: 98.4 F (36.9 C) (10/02 0700) Temp Source: Oral (10/02 0700) BP: 119/77 mmHg (10/02 1130) Pulse Rate: 69 (10/02 1130)  Labs:  Recent Labs  05/16/14 0839 05/16/14 1647 05/16/14 2320 05/16/14 2325 05/17/14 0312 05/17/14 0806  HGB 13.2  --   --   --  12.0  --   HCT 38.4  --   --   --  34.9*  --   PLT 268  --   --   --  210  --   APTT  --  29  --   --   --   --   LABPROT  --  14.1  --   --   --   --   INR  --  1.08  --   --   --   --   HEPARINUNFRC  --   --   --  0.13*  --  <0.10*  CREATININE 1.10  --   --   --  0.92  --   TROPONINI <0.30  --  <0.30  --  <0.30  --     Estimated Creatinine Clearance: 115.7 ml/min (by C-G formula based on Cr of 0.92).   Medical History: Past Medical History  Diagnosis Date  . Hx of migraines   . Hyperlipidemia   . Allergy   . History of chicken pox   . Headache(784.0)   . Infection     trich    Assessment: [name] tx from [facility] with new onset aflutter. Initially started on heparin gtt. Successful DCCV today to transition to Xarelto. CBC wnl with no reported s/s bleeding and SCr wnl at 0.92 (CrCl >100 ml/min).   Goal of Therapy:  Heparin level 0.3-0.7 units/ml Monitor platelets by anticoagulation protocol: Yes   Plan:  - D/c heparin - Start Xarelto 20 mg PO daily  - Monitor for bleeding  Margie Billet, PharmD Clinical Pharmacist - Resident Pager: [phone] Pharmacy: [phone] 05/17/2014 11:58 AM

## 2014-05-17 NOTE — Progress Notes (Signed)
ANTICOAGULATION CONSULT NOTE - Initial Consult  Pharmacy Consult for Heparin  Indication: atrial flutter, new onset  No Known Allergies  Patient Measurements: Height: 5\' 6"  (167.6 cm) Weight: 235 lb 12.8 oz (106.958 kg) IBW/kg (Calculated) : 59.3   Vital Signs: Temp: 97.7 F (36.5 C) (10/02 0000) Temp Source: Oral (10/02 0000) BP: 111/92 mmHg (10/02 0000) Pulse Rate: 82 (10/02 0000)  Labs:  Recent Labs  05/16/14 0839 05/16/14 1647 05/16/14 2320 05/16/14 2325  HGB 13.2  --   --   --   HCT 38.4  --   --   --   PLT 268  --   --   --   APTT  --  29  --   --   LABPROT  --  14.1  --   --   INR  --  1.08  --   --   HEPARINUNFRC  --   --   --  0.13*  CREATININE 1.10  --   --   --   TROPONINI <0.30  --  <0.30  --     Estimated Creatinine Clearance: 96.8 ml/min (by C-G formula based on Cr of 1.1).   Medical History: Past Medical History  Diagnosis Date  . Hx of migraines   . Hyperlipidemia   . Allergy   . History of chicken pox   . Headache(784.0)   . Infection     trich    Assessment: [name] tx from [facility] with new onset aflutter. Tx with heparin running at 950 units/hr, first HL is 0.13, other labs as above. Plan is for TEE/DCCV this AM, no issue per RN.   Goal of Therapy:  Heparin level 0.3-0.7 units/ml Monitor platelets by anticoagulation protocol: Yes   Plan:  -Increase heparin drip to 1150 units/hr -0700 HL -Daily CBC/HL -Monitor for bleeding  Tracy Grant 05/17/2014,12:36 AM

## 2014-05-17 NOTE — Op Note (Signed)
Cardioversion  Patient underwent TEE prior to cardioversion.  No evid for LA appendage thrombus.  Anesthestized with fentanyl and Versed  Pads placed in AP position.    Patient cardioverted to SR with 150 J synchronized biphasic energy.    Procedure without complication.  12 lead EKG pending.

## 2014-05-17 NOTE — Progress Notes (Signed)
[facility] PROGRESS NOTE  Tracy Grant MLJ:[id number] DOB: [birth year] DOA: 05/16/2014 PCP: Nicki Reaper, NP  CXR: R basilar infiltrate  -Pneumonia-CAP, added ceftriaxone to azithromycin;    [name]  [facility] Pager [phone]. If 7PM-7AM, please contact night-coverage at [email or web address], password [email or web address] 05/17/2014, 2:32 PM  LOS: 1 day

## 2014-05-17 NOTE — Progress Notes (Signed)
[facility] PROGRESS NOTE  Tracy Grant PPJ:[id number] DOB: 19-Mar-1986 DOA: 05/16/2014 PCP: Nicki Reaper, NP  Assessment/Plan: 28 y.o. female, with significant past medical history of hyperlipidemia and pre-hypertension recently had a childbirth developed URI symptoms (3-4 weeks) with palpitation and found to have  A flutter  1. A flutter; hemodynamically stable; "+" FH for early CAD, arrythmia's  -TSH 0.5; trop negative; HR improved on IV cardizem+ BB; pend echo  -defer management to cards; pend TEE DCCV; on IV heparin   2. URI, +tobacco use; afebrile; no leukocytosis;  -CXR: Subtle asymmetric increased lung density at the right base; will obtain CXR 2 view  -cont inhalers prn; azithromycin; d/w patient, stop smoking    Code Status: full Family Communication:  D/w patient, her mother  (indicate person spoken with, relationship, and if by phone, the number) Disposition Plan: pend TEE DCCV   Consultants:  Cardiology   Procedures:  Pend TEE  Antibiotics:  Azithromycin  (indicate start date, and stop date if known)  HPI/Subjective: alert  Objective: Filed Vitals:   05/17/14 0800  BP: 130/91  Pulse: 92  Temp:   Resp: 18    Intake/Output Summary (Last 24 hours) at 05/17/14 0919 Last data filed at 05/17/14 0800  Gross per 24 hour  Intake 1009.42 ml  Output    650 ml  Net 359.42 ml   Filed Weights   05/16/14 1633  Weight: 106.958 kg (235 lb 12.8 oz)    Exam:   General:  alert  Cardiovascular: s1,s2 irregular   Respiratory: no wheezing   Abdomen: soft, nt,nd   Musculoskeletal: no LE edema   Data Reviewed: Basic Metabolic Panel:  Recent Labs Lab 05/16/14 0839 05/17/14 0312  NA 142 138  K 4.3 3.7  CL 106 102  CO2 25 25  GLUCOSE 101* 90  BUN 11 6  CREATININE 1.10 0.92  CALCIUM 9.2 8.9   Liver Function Tests:  Recent Labs Lab 05/16/14 0839  AST 32  ALT 42*  ALKPHOS 63  BILITOT 0.4  PROT 7.4  ALBUMIN 3.8   No results  found for this basename: LIPASE, AMYLASE,  in the last 168 hours No results found for this basename: AMMONIA,  in the last 168 hours CBC:  Recent Labs Lab 05/16/14 0839 05/17/14 0312  WBC 7.5 6.2  HGB 13.2 12.0  HCT 38.4 34.9*  MCV 92.8 92.6  PLT 268 210   Cardiac Enzymes:  Recent Labs Lab 05/16/14 0839 05/16/14 2320 05/17/14 0312  TROPONINI <0.30 <0.30 <0.30   BNP (last 3 results) No results found for this basename: PROBNP,  in the last 8760 hours CBG: No results found for this basename: GLUCAP,  in the last 168 hours  Recent Results (from the past 240 hour(s))  MRSA PCR SCREENING     Status: None   Collection Time    05/16/14  9:18 PM      Result Value Ref Range Status   MRSA by PCR NEGATIVE  NEGATIVE Final   Comment:            The GeneXpert MRSA Assay (FDA     approved for NASAL specimens     only), is one component of a     comprehensive MRSA colonization     surveillance program. It is not     intended to diagnose MRSA     infection nor to guide or     monitor treatment for     MRSA infections.  Studies: Dg Chest Port 1 View  05/16/2014   CLINICAL DATA:  28 year old female with chest heaviness, chest pain, shortness of breath starting last night. Recent cold. Supraventricular tachycardia. Initial encounter.  EXAM: PORTABLE CHEST - 1 VIEW  COMPARISON:  09/25/2010.  FINDINGS: Portable AP semi upright view at 0856 hrs. Large body habitus. There is mildly asymmetric indistinct pulmonary opacity at the right lung base, but I favor overlying soft tissue attenuation artifact. Normal cardiac size and mediastinal contours. Visualized tracheal air column is within normal limits. No pneumothorax, pulmonary edema or pleural effusion.  IMPRESSION: Subtle asymmetric increased lung density at the right base, but favor artifact due to large body habitus. Recommend followup Standing PA and lateral views when possible.   Electronically Signed   By: Augusto GambleLee  Hall M.D.   On:  05/16/2014 09:09    Scheduled Meds: . azithromycin  500 mg Intravenous Q24H  . fentaNYL      . guaiFENesin  600 mg Oral BID  . metoprolol tartrate  25 mg Oral BID  . midazolam      . sodium chloride  3 mL Intravenous Q12H   Continuous Infusions: . sodium chloride 75 mL/hr at 05/17/14 0700  . sodium chloride Stopped (05/17/14 0046)  . sodium chloride    . diltiazem (CARDIZEM) infusion 10 mg/hr (05/17/14 0700)  . heparin 1,150 Units/hr (05/17/14 0700)    Active Problems:   Atrial flutter   Fever   Abnormal chest x-ray   Cough   Atrial flutter with rapid ventricular response    Time spent: >35 MINUTES     Esperanza SheetsBURIEV, Rameen Quinney N  [facility] Pager [phone]. If 7PM-7AM, please contact night-coverage at [email or web address], password [email or web address] 05/17/2014, 9:19 AM  LOS: 1 day

## 2014-05-18 DIAGNOSIS — I483 Typical atrial flutter: Secondary | ICD-10-CM

## 2014-05-18 DIAGNOSIS — I10 Essential (primary) hypertension: Secondary | ICD-10-CM

## 2014-05-18 DIAGNOSIS — I429 Cardiomyopathy, unspecified: Secondary | ICD-10-CM

## 2014-05-18 DIAGNOSIS — J189 Pneumonia, unspecified organism: Secondary | ICD-10-CM

## 2014-05-18 LAB — PRO B NATRIURETIC PEPTIDE: Pro B Natriuretic peptide (BNP): 163.3 pg/mL — ABNORMAL HIGH (ref 0–125)

## 2014-05-18 MED ORDER — BISACODYL 10 MG RE SUPP: 10.0000 mg | Freq: Once | RECTAL | Status: DC

## 2014-05-18 MED ORDER — SENNOSIDES-DOCUSATE SODIUM 8.6-50 MG PO TABS
2.0000 | ORAL_TABLET | Freq: Every evening | ORAL | Status: DC | PRN
Start: 2014-05-18 — End: 2014-05-19
  Administered 2014-05-18: 2 via ORAL
  Filled 2014-05-18: qty 2

## 2014-05-18 MED ORDER — LEVOFLOXACIN 750 MG PO TABS
750.0000 mg | ORAL_TABLET | Freq: Every day | ORAL | Status: DC
  Administered 2014-05-18 – 2014-05-19 (×2): 750 mg via ORAL
  Filled 2014-05-18 (×2): qty 1

## 2014-05-18 MED ORDER — LISINOPRIL 10 MG PO TABS
10.0000 mg | ORAL_TABLET | Freq: Every day | ORAL | Status: DC
Start: 2014-05-18 — End: 2014-05-19
  Administered 2014-05-18 – 2014-05-19 (×2): 10 mg via ORAL
  Filled 2014-05-18 (×3): qty 1

## 2014-05-18 NOTE — Progress Notes (Signed)
Patient Name: Tracy OuchShannelle T Criger Date of Encounter: 05/18/2014  Active Problems:   Atrial flutter   Fever   Abnormal chest x-ray   Cough   Atrial flutter with rapid ventricular response   Length of Stay: 2  SUBJECTIVE  No arrhythmia since cardioversion other than scattered PACs. Maintains a very short PR interval.  CURRENT MEDS . guaiFENesin  600 mg Oral BID  . levofloxacin  750 mg Oral Daily  . metoprolol tartrate  25 mg Oral QID  . rivaroxaban  20 mg Oral Daily  . sodium chloride  3 mL Intravenous Q12H    OBJECTIVE   Intake/Output Summary (Last 24 hours) at 05/18/14 1301 Last data filed at 05/18/14 0900  Gross per 24 hour  Intake   1256 ml  Output    801 ml  Net    455 ml   Filed Weights   05/16/14 1633  Weight: 106.958 kg (235 lb 12.8 oz)    PHYSICAL EXAM Filed Vitals:   05/18/14 0756 05/18/14 1153 05/18/14 1200 05/18/14 1202  BP: 132/81 159/102 145/99 144/99  Pulse: 82 65    Temp: 97.6 F (36.4 C) 98 F (36.7 C)    TempSrc: Oral Oral    Resp: 22 27 21    Height:      Weight:      SpO2: 97% 95%     General: Alert, oriented x3, no distress Head: no evidence of trauma, PERRL, EOMI, no exophtalmos or lid lag, no myxedema, no xanthelasma; normal ears, nose and oropharynx Neck: normal jugular venous pulsations and no hepatojugular reflux; brisk carotid pulses without delay and no carotid bruits Chest: clear to auscultation, no signs of consolidation by percussion or palpation, normal fremitus, symmetrical and full respiratory excursions Cardiovascular: normal position and quality of the apical impulse, regular rhythm, normal first and second heart sounds, no rubs or gallops, no murmur Abdomen: no tenderness or distention, no masses by palpation, no abnormal pulsatility or arterial bruits, normal bowel sounds, no hepatosplenomegaly Extremities: no clubbing, cyanosis or edema; 2+ radial, ulnar and brachial pulses bilaterally; 2+ right femoral, posterior tibial  and dorsalis pedis pulses; 2+ left femoral, posterior tibial and dorsalis pedis pulses; no subclavian or femoral bruits Neurological: grossly nonfocal  LABS  CBC  Recent Labs  05/16/14 0839 05/17/14 0312  WBC 7.5 6.2  HGB 13.2 12.0  HCT 38.4 34.9*  MCV 92.8 92.6  PLT 268 210   Basic Metabolic Panel  Recent Labs  05/16/14 0839 05/17/14 0312  NA 142 138  K 4.3 3.7  CL 106 102  CO2 25 25  GLUCOSE 101* 90  BUN 11 6  CREATININE 1.10 0.92  CALCIUM 9.2 8.9   Liver Function Tests  Recent Labs  05/16/14 0839  AST 32  ALT 42*  ALKPHOS 63  BILITOT 0.4  PROT 7.4  ALBUMIN 3.8   No results found for this basename: LIPASE, AMYLASE,  in the last 72 hours Cardiac Enzymes  Recent Labs  05/16/14 0839 05/16/14 2320 05/17/14 0312  TROPONINI <0.30 <0.30 <0.30    Recent Labs  05/16/14 1140  TSH 0.515    Radiology Studies Imaging results have been reviewed and Dg Chest 2 View  05/17/2014   CLINICAL DATA:  Cough, fever, history of atrial flutter with rapid ventricular response  EXAM: CHEST  2 VIEW  COMPARISON:  05/16/2014  FINDINGS: Cardiac shadow is stable but enlarged. The left lung is clear. Persistent right basilar infiltrative density is noted. No acute bony abnormality is  seen. No pleural effusion is noted.  IMPRESSION: Persistent right basilar infiltrate. Followup films resolution after appropriate therapy are recommended.   Electronically Signed   By: Alcide Clever M.D.   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 Treatment in the short term is similar, although prognostic implications are different. Critical that she continue uninterrupted anticoagulation for at least 30 days  (preferably continue until EF normalizes, otherwise indefinitely) Start ACE inhibitor OK to transfer to telemetry. Possible DC in 24 hours.  Thurmon Fair, MD, [facility] [phone] office [phone] pager 05/18/2014 1:01 PM

## 2014-05-18 NOTE — Progress Notes (Signed)
Transferred to 3west room 10 by wheelchair, stable, belongings with pt. Report given to RN.

## 2014-05-18 NOTE — Progress Notes (Signed)
[facility] PROGRESS NOTE  Tracy Grant JYN:[id number] DOB: [birth year] DOA: 05/16/2014 PCP: Tracy Grant, [name], NP  Assessment/Plan: 28 y.o. female, with significant past medical history of hyperlipidemia and pre-hypertension recently had a childbirth developed URI symptoms (3-4 weeks) with palpitation and found to have  A flutter  1. A flutter; hemodynamically stable; "+" FH for early CAD, arrythmia's  -TSH 0.5; trop negative;  -10/3: s/p TEE DCCP; in NSR, xarelto started per cards   2. Acute systolic CHF LV dysfunction; echo: LVEF 35-40%, mild MR; ? 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 Initially thought URI; but CXR: persistent infiltrate likely pneumonia; CAP; +tobacco use; -changed atx to levofloxacin; (Pt is not breastfeeding, and no plans to breasfeed; d/w patient at length, her mother not to  breastfeeding ) afebrile; no leukocytosis;    Code Status: full Family Communication:  D/w patient, her mother  (indicate person spoken with, relationship, and if by phone, the number) Disposition Plan: pend card eval    Consultants:  Cardiology   Procedures:  TEE  Antibiotics:  Azithromycin 10/2  Ceftriaxone 10/2  levofloaxcin 10/3<<<   known) HPI/Subjective:  alert  Objective: Filed Vitals:   05/18/14 0756  BP: 132/81  Pulse: 82  Temp: 97.6 F (36.4 C)  Resp: 22    Intake/Output Summary (Last 24 hours) at 05/18/14 1142 Last data filed at 05/18/14 0900  Gross per 24 hour  Intake   1519 ml  Output    801 ml  Net    718 ml   Filed Weights   05/16/14 1633  Weight: 106.958 kg (235 lb 12.8 oz)    Exam:   General:  alert  Cardiovascular: s1,s2 irregular   Respiratory: no wheezing   Abdomen: soft, nt,nd   Musculoskeletal: no LE edema   Data Reviewed: Basic Metabolic Panel:  Recent Labs Lab 05/16/14 0839 05/17/14 0312  NA 142 138  K 4.3 3.7  CL 106 102  CO2 25 25  GLUCOSE 101* 90  BUN 11  6  CREATININE 1.10 0.92  CALCIUM 9.2 8.9   Liver Function Tests:  Recent Labs Lab 05/16/14 0839  AST 32  ALT 42*  ALKPHOS 63  BILITOT 0.4  PROT 7.4  ALBUMIN 3.8   No results found for this basename: LIPASE, AMYLASE,  in the last 168 hours No results found for this basename: AMMONIA,  in the last 168 hours CBC:  Recent Labs Lab 05/16/14 0839 05/17/14 0312  WBC 7.5 6.2  HGB 13.2 12.0  HCT 38.4 34.9*  MCV 92.8 92.6  PLT 268 210   Cardiac Enzymes:  Recent Labs Lab 05/16/14 0839 05/16/14 2320 05/17/14 0312  TROPONINI <0.30 <0.30 <0.30   BNP (last 3 results) No results found for this basename: PROBNP,  in the last 8760 hours CBG: No results found for this basename: GLUCAP,  in the last 168 hours  Recent Results (from the past 240 hour(s))  MRSA PCR SCREENING     Status: None   Collection Time    05/16/14  9:18 PM      Result Value Ref Range Status   MRSA by PCR NEGATIVE  NEGATIVE Final   Comment:            The GeneXpert MRSA Assay (FDA     approved for NASAL specimens     only), is one component of a     comprehensive MRSA colonization     surveillance program. It is not  intended to diagnose MRSA     infection nor to guide or     monitor treatment for     MRSA infections.     Studies: Dg Chest 2 View  05/17/2014   CLINICAL DATA:  Cough, fever, history of atrial flutter with rapid ventricular response  EXAM: CHEST  2 VIEW  COMPARISON:  05/16/2014  FINDINGS: Cardiac shadow is stable but enlarged. The left lung is clear. Persistent right basilar infiltrative density is noted. No acute bony abnormality is seen. No pleural effusion is noted.  IMPRESSION: Persistent right basilar infiltrate. Followup films resolution after appropriate therapy are recommended.   Electronically Signed   By: Alcide Clever M.D.   On: 05/17/2014 14:17    Scheduled Meds: . azithromycin  500 mg Intravenous Q24H  . cefTRIAXone (ROCEPHIN)  IV  2 g Intravenous Q24H  . guaiFENesin   600 mg Oral BID  . metoprolol tartrate  25 mg Oral QID  . rivaroxaban  20 mg Oral Daily  . sodium chloride  3 mL Intravenous Q12H   Continuous Infusions:    Active Problems:   Atrial flutter   Fever   Abnormal chest x-ray   Cough   Atrial flutter with rapid ventricular response    Time spent: >35 MINUTES     [name]  [facility] Pager [phone]. If 7PM-7AM, please contact night-coverage at [email or web address], password [facility] 05/18/2014, 11:42 AM  LOS: 2 days

## 2014-05-19 MED ORDER — LEVOFLOXACIN 750 MG PO TABS: 750.0000 mg | ORAL_TABLET | Freq: Every day | ORAL | Status: DC

## 2014-05-19 MED ORDER — METOPROLOL SUCCINATE ER 100 MG PO TB24
100.0000 mg | ORAL_TABLET | Freq: Every day | ORAL | Status: DC
  Filled 2014-05-19: qty 1

## 2014-05-19 MED ORDER — ACETAMINOPHEN 325 MG PO TABS: 650.0000 mg | ORAL_TABLET | Freq: Four times a day (QID) | ORAL | Status: DC | PRN

## 2014-05-19 MED ORDER — LISINOPRIL 10 MG PO TABS: 10.0000 mg | ORAL_TABLET | Freq: Every day | ORAL | Status: DC

## 2014-05-19 MED ORDER — GUAIFENESIN ER 600 MG PO TB12: 600.0000 mg | ORAL_TABLET | Freq: Two times a day (BID) | ORAL | Status: DC

## 2014-05-19 MED ORDER — METOPROLOL SUCCINATE ER 100 MG PO TB24: 100.0000 mg | ORAL_TABLET | Freq: Every day | ORAL | Status: DC

## 2014-05-19 MED ORDER — RIVAROXABAN 20 MG PO TABS: 20.0000 mg | ORAL_TABLET | Freq: Every day | ORAL | Status: DC

## 2014-05-19 NOTE — Progress Notes (Signed)
Patient B/P 162/105, asymptomatic, NP M.Lynch paged twice, awaiting new orders. Will continue to monitor patient.

## 2014-05-19 NOTE — Discharge Instructions (Signed)

## 2014-05-19 NOTE — Progress Notes (Signed)
   Cardiology: Dr. Mayford Knife (new) Subjective:  No complaints.  Feels good. Eager to see baby boy at home.   Objective:  Vital Signs in the last 24 hours: Temp:  [98.1 F (36.7 C)-98.7 F (37.1 C)] 98.7 F (37.1 C) (10/04 0550) Pulse Rate:  [71-77] 72 (10/04 0550) Resp:  [18-20] 20 (10/04 0550) BP: (144-162)/(83-105) 162/105 mmHg (10/04 0550) SpO2:  [100 %] 100 % (10/04 0550) Weight:  [237 lb 8 oz (107.729 kg)-239 lb (108.41 kg)] 237 lb 8 oz (107.729 kg) (10/04 0550)  Intake/Output from previous day: 10/03 0701 - 10/04 0700 In: 863 [P.O.:860; I.V.:3] Out: 300 [Urine:300]   Physical Exam: General: Well developed, well nourished, in no acute distress. Head:  Normocephalic and atraumatic. Lungs: Clear to auscultation and percussion. Heart: Normal S1 and S2.  No murmur, rubs or gallops.  Abdomen: soft, non-tender, positive bowel sounds.Overweight Extremities: No clubbing or cyanosis. No edema. Neurologic: Alert and oriented x 3.    Lab Results:  Recent Labs  05/17/14 0312  WBC 6.2  HGB 12.0  PLT 210    Recent Labs  05/17/14 0312  NA 138  K 3.7  CL 102  CO2 25  GLUCOSE 90  BUN 6  CREATININE 0.92    Recent Labs  05/16/14 2320 05/17/14 0312  TROPONINI <0.30 <0.30     Imaging: Dg Chest 2 View  05/17/2014   CLINICAL DATA:  Cough, fever, history of atrial flutter with rapid ventricular response  EXAM: CHEST  2 VIEW  COMPARISON:  05/16/2014  FINDINGS: Cardiac shadow is stable but enlarged. The left lung is clear. Persistent right basilar infiltrative density is noted. No acute bony abnormality is seen. No pleural effusion is noted.  IMPRESSION: Persistent right basilar infiltrate. Followup films resolution after appropriate therapy are recommended.   Electronically Signed   By: Alcide Clever M.D.   On: 05/17/2014 14:17   Telemetry: PAC's. No aflutter Personally viewed.  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  -I consolidated beta blocker to Toprol 100mg .  -Hopefully EF will return to normal now that she is back in normal rhythm. 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 If this is case, would be at increased risk for future bouts of cardiomyopathy during pregnancy.  -On ACE-I - contraception. Teratogenic. -Xarelto (CHADS-VASc is 2 - female and heart failure). Continue until reassessment of EF in 3 months.  -Discussed diet, low sodium.  -OK with DC home.  -Will have follow up with Dr. Mayford Knife or APP in 1-2 weeks.      SKAINS, MARK 05/19/2014, 12:11 PM

## 2014-05-19 NOTE — Progress Notes (Signed)
NP K.Lynch aware of patient B/P 162/105, no new orders given, will continue to monitor patient.

## 2014-05-19 NOTE — Discharge Summary (Signed)
Physician Discharge Summary  Tracy Grant HWT:[id number] DOB: Feb 12, 1986 DOA: 05/16/2014  PCP: Nicki Reaper, NP  Admit date: 05/16/2014 Discharge date: 05/19/2014  Time spent: >35 minutes  minutes  Recommendations for Outpatient Follow-up:  F/u with cardiology in 1-2 weeks F/u with PCP in 1 week   Discharge Diagnoses:  Active Problems:   Atrial flutter   Fever   Abnormal chest x-ray   Cough   Atrial flutter with rapid ventricular response   Discharge Condition: stable   Diet recommendation: low sodium   Filed Weights   05/16/14 1633 05/18/14 1751 05/19/14 0550  Weight: 106.958 kg (235 lb 12.8 oz) 108.41 kg (239 lb) 107.729 kg (237 lb 8 oz)    History of present illness:  28 y.o. female, with significant past medical history of hyperlipidemia and pre-hypertension recently had a childbirth developed URI symptoms (3-4 weeks) with palpitation and found to have A flutter    Hospital Course:  1. A flutter; hemodynamically stable; "+" FH for early CAD, arrythmia's  -TSH 0.5; trop negative;  -10/3: s/p TEE DCCP; in NSR, xarelto started per cards  2. Acute systolic CHF LV dysfunction; echo: LVEF 35-40%, mild MR; ? 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 Initially thought URI; but CXR: persistent infiltrate likely pneumonia; CAP; +tobacco use;  -changed atx to levofloxacin; (Pt is not breastfeeding, and no plans to breasfeed; d/w patient at length, her mother not to breastfeeding ) afebrile; no leukocytosis;   D/w patient, her mother about side effect of meds; they understand agreed to f/u as outpatient      Procedures:  TEE DCCV (i.e. Studies not automatically included, echos, thoracentesis, etc; not x-rays)  Consultations:  Cardiology   Discharge Exam: Filed Vitals:   05/19/14 0550  BP: 162/105  Pulse: 72  Temp: 98.7 F (37.1 C)  Resp: 20    General: alert Cardiovascular: s1,s2  rrr Respiratory: CTA BL  Discharge Instructions  Discharge Instructions   Diet - low sodium heart healthy    Complete by:  As directed      Discharge instructions    Complete by:  As directed   Please follow up with cardiology in 1-2 weeks  Please follow up with primary care doctor in 1 week     Increase activity slowly    Complete by:  As directed             Medication List         acetaminophen 325 MG tablet  Commonly known as:  TYLENOL  Take 2 tablets (650 mg total) by mouth every 6 (six) hours as needed for mild pain (or Fever >/= 101).     guaiFENesin 600 MG 12 hr tablet  Commonly known as:  MUCINEX  Take 1 tablet (600 mg total) by mouth 2 (two) times daily.     levofloxacin 750 MG tablet  Commonly known as:  LEVAQUIN  Take 1 tablet (750 mg total) by mouth daily.     lisinopril 10 MG tablet  Commonly known as:  PRINIVIL,ZESTRIL  Take 1 tablet (10 mg total) by mouth daily.     metoprolol succinate 100 MG 24 hr tablet  Commonly known as:  TOPROL-XL  Take 1 tablet (100 mg total) by mouth daily. Take with or immediately following a meal.     rivaroxaban 20 MG Tabs tablet  Commonly known as:  XARELTO  Take 1 tablet (20  mg total) by mouth daily.       No Known Allergies     Follow-up Information   Follow up with Quintella Reichert, MD In 2 weeks.   Specialty:  Cardiology   Contact information:   [address] [place] [address] [phone]       Follow up with Nicki Reaper, NP In 1 week.   Specialty:  Internal Medicine   Contact information:   [address] [place] [address] [phone]        The results of significant diagnostics from this hospitalization (including imaging, microbiology, ancillary and laboratory) are listed below for reference.    Significant Diagnostic Studies: Dg Chest 2 View  05/17/2014   CLINICAL DATA:  Cough, fever, history of atrial flutter with rapid ventricular response  EXAM: CHEST  2 VIEW   COMPARISON:  05/16/2014  FINDINGS: Cardiac shadow is stable but enlarged. The left lung is clear. Persistent right basilar infiltrative density is noted. No acute bony abnormality is seen. No pleural effusion is noted.  IMPRESSION: Persistent right basilar infiltrate. Followup films resolution after appropriate therapy are recommended.   Electronically Signed   By: Alcide Clever M.D.   On: 05/17/2014 14:17   Dg Chest Port 1 View  05/16/2014   CLINICAL DATA:  28 year old female with chest heaviness, chest pain, shortness of breath starting last night. Recent cold. Supraventricular tachycardia. Initial encounter.  EXAM: PORTABLE CHEST - 1 VIEW  COMPARISON:  09/25/2010.  FINDINGS: Portable AP semi upright view at 0856 hrs. Large body habitus. There is mildly asymmetric indistinct pulmonary opacity at the right lung base, but I favor overlying soft tissue attenuation artifact. Normal cardiac size and mediastinal contours. Visualized tracheal air column is within normal limits. No pneumothorax, pulmonary edema or pleural effusion.  IMPRESSION: Subtle asymmetric increased lung density at the right base, but favor artifact due to large body habitus. Recommend followup Standing PA and lateral views when possible.   Electronically Signed   By: Augusto Gamble M.D.   On: 05/16/2014 09:09    Microbiology: Recent Results (from the past 240 hour(s))  MRSA PCR SCREENING     Status: None   Collection Time    05/16/14  9:18 PM      Result Value Ref Range Status   MRSA by PCR NEGATIVE  NEGATIVE Final   Comment:            The GeneXpert MRSA Assay (FDA     approved for NASAL specimens     only), is one component of a     comprehensive MRSA colonization     surveillance program. It is not     intended to diagnose MRSA     infection nor to guide or     monitor treatment for     MRSA infections.     Labs: Basic Metabolic Panel:  Recent Labs Lab 05/16/14 0839 05/17/14 0312  NA 142 138  K 4.3 3.7  CL 106 102   CO2 25 25  GLUCOSE 101* 90  BUN 11 6  CREATININE 1.10 0.92  CALCIUM 9.2 8.9   Liver Function Tests:  Recent Labs Lab 05/16/14 0839  AST 32  ALT 42*  ALKPHOS 63  BILITOT 0.4  PROT 7.4  ALBUMIN 3.8   No results found for this basename: LIPASE, AMYLASE,  in the last 168 hours No results found for this basename: AMMONIA,  in the last 168 hours CBC:  Recent Labs Lab  05/16/14 0839 05/17/14 0312  WBC 7.5 6.2  HGB 13.2 12.0  HCT 38.4 34.9*  MCV 92.8 92.6  PLT 268 210   Cardiac Enzymes:  Recent Labs Lab 05/16/14 0839 05/16/14 2320 05/17/14 0312  TROPONINI <0.30 <0.30 <0.30   BNP: BNP (last 3 results)  Recent Labs  05/18/14 1255  PROBNP 163.3*   CBG: No results found for this basename: GLUCAP,  in the last 168 hours     Signed:  Jonette MateBURIEV, Alexiya Franqui N  [facility] 05/19/2014, 1:30 PM

## 2014-06-05 ENCOUNTER — Encounter: Payer: Medicaid Other | Admitting: Physician Assistant

## 2014-06-06 ENCOUNTER — Encounter: Payer: Medicaid Other | Admitting: Physician Assistant

## 2014-06-17 ENCOUNTER — Encounter (HOSPITAL_COMMUNITY): Payer: Self-pay | Admitting: Emergency Medicine

## 2014-06-24 ENCOUNTER — Encounter: Payer: Self-pay | Admitting: Physician Assistant

## 2014-06-24 ENCOUNTER — Ambulatory Visit (INDEPENDENT_AMBULATORY_CARE_PROVIDER_SITE_OTHER): Payer: Medicaid Other | Admitting: Physician Assistant

## 2014-06-24 VITALS — BP 165/93 | HR 70 | Ht 66.0 in | Wt 236.0 lb

## 2014-06-24 DIAGNOSIS — I4892 Unspecified atrial flutter: Secondary | ICD-10-CM

## 2014-06-24 DIAGNOSIS — I429 Cardiomyopathy, unspecified: Secondary | ICD-10-CM

## 2014-06-24 DIAGNOSIS — I10 Essential (primary) hypertension: Secondary | ICD-10-CM

## 2014-06-24 MED ORDER — RIVAROXABAN 20 MG PO TABS: 20.0000 mg | ORAL_TABLET | Freq: Every day | ORAL | Status: DC

## 2014-06-24 MED ORDER — METOPROLOL SUCCINATE ER 100 MG PO TB24: 100.0000 mg | ORAL_TABLET | Freq: Every day | ORAL | Status: DC

## 2014-06-24 MED ORDER — LISINOPRIL 10 MG PO TABS: 10.0000 mg | ORAL_TABLET | Freq: Every day | ORAL | Status: DC

## 2014-06-24 NOTE — Patient Instructions (Signed)
LAB WORK IN 1 WEEK; BMET  Your physician has requested that you have an echocardiogram THIS WILL NEED TO BE DONE AFTER THE 1ST WEEK IN 08/2014. Echocardiography is a painless test that uses sound waves to create images of your heart. It provides your doctor with information about the size and shape of your heart and how well your heart's chambers and valves are working. This procedure takes approximately one hour. There are no restrictions for this procedure.  Your physician recommends that you schedule a follow-up appointment in: 1 MONTH WITH DR. Georges Mouse WEAVER, PAC WILL DISCUSS WITH DR. Mayford Knife IF WE NEED TO REFER YOU TO ONE OF OUR ELECTROPHYSIOLOGIST FOR POSSIBLE ABLATION

## 2014-06-24 NOTE — Progress Notes (Signed)
Cardiology Office Note   Date:  06/24/2014   ID:  Tracy Grant, DOB [birth year], MRN [id number]  PCP:  [name], [name], NP  Cardiologist:  Dr. Armanda Magicraci Turner     History of Present Illness: Tracy Grant is a 28 y.o. female with a history of borderline hypertension, HL. Patient recently gave birth in June 2015. Pregnancy and delivery was uncomplicated and she delivered by C-section.   She was recently admitted 10/1-10/4 with atrial flutter with RVR. Patient had noted intermittent palpitations since giving birth. Valsalva and adenosine were used unsuccessfully to convert her to normal rhythm. She was seen by cardiology. She was placed on rate control therapy with diltiazem IV drip. Echocardiogram demonstrated reduced LV function with an EF of 35-40%. She underwent TEE guided cardioversion with restoration of normal sinus rhythm. She was anticoagulated with heparin and transitioned to Xarelto. She was placed on beta blocker and ACE inhibitor therapy for her cardiomyopathy. It was questioned whether or not she had tachycardia-induced cardiomyopathy versus peripartum cardiomyopathy. It was recommended that she remain on anticoagulation therapy at least until she has a follow-up echocardiogram (to document recovery of LVF).  Of note, chest x-ray demonstrated a persistent infiltrate and she was treated with antibiotics for possible community-acquired pneumonia.  She returns for follow-up. She is here alone. She ran out of all of her medications several days ago. She had no refills. She has otherwise felt well.The patient denies any chest pain, significant dyspnea, syncope, orthopnea, PND, edema.  Studies:  - Echo (10/15):  EF 35-40%, mild MR, mild to moderately reduced RV systolic function, PASP 31 mmHg   Recent Labs/Images:  05/16/2014: ALT 42*; TSH 0.515 05/17/2014: BUN 6; Creatinine 0.92; Hemoglobin 12.0; Potassium 3.7; Sodium 138 05/18/2014: Pro B Natriuretic peptide (BNP) 163.3*   Dg  Chest 2 View  05/17/2014    IMPRESSION: Persistent right basilar infiltrate. Followup films resolution after appropriate therapy are recommended.   Electronically Signed   By: Alcide CleverMark  Lukens M.D.   On: 05/17/2014 14:17     Wt Readings from Last 3 Encounters:  06/24/14 236 lb (107.049 kg)  05/19/14 237 lb 8 oz (107.729 kg)  01/29/14 234 lb (106.142 kg)     Past Medical History  Diagnosis Date  . Hx of migraines   . Hyperlipidemia   . Allergy   . History of chicken pox   . Headache(784.0)   . Infection     trich    Current Outpatient Prescriptions  Medication Sig Dispense Refill  . acetaminophen (TYLENOL) 325 MG tablet Take 2 tablets (650 mg total) by mouth every 6 (six) hours as needed for mild pain (or Fever >/= 101).    Marland Kitchen. lisinopril (PRINIVIL,ZESTRIL) 10 MG tablet Take 1 tablet (10 mg total) by mouth daily. 30 tablet 0  . metoprolol succinate (TOPROL-XL) 100 MG 24 hr tablet Take 1 tablet (100 mg total) by mouth daily. Take with or immediately following a meal. 30 tablet 0  . rivaroxaban (XARELTO) 20 MG TABS tablet Take 1 tablet (20 mg total) by mouth daily. 30 tablet 0   No current facility-administered medications for this visit.     Allergies:   Review of patient's allergies indicates no known allergies.   Social History:  The patient  reports that she has quit smoking. She has never used smokeless tobacco. She reports that she drinks alcohol. She reports that she does not use illicit drugs.   Family History:  The patient's family history includes Heart attack  in her mother; Hypertension in her father, maternal grandfather, maternal grandmother, mother, paternal grandfather, and paternal grandmother.   ROS:  Please see the history of present illness.   No significant bleeding problems.   All other systems reviewed and negative.    PHYSICAL EXAM: VS:  BP 165/93 mmHg  Pulse 70  Ht 5\' 6"  (1.676 m)  Wt 236 lb (107.049 kg)  BMI 38.11 kg/m2 Well nourished, well developed, in  no acute distress HEENT: normal Neck: no JVD Cardiac:  normal S1, S2; RRR; no murmur Lungs:  clear to auscultation bilaterally, no wheezing, rhonchi or rales Abd: soft, nontender, no hepatomegaly Ext: no edema Skin: warm and dry Neuro:  CNs 2-12 intact, no focal abnormalities noted  EKG:  NSR, HR 70, short PR interval, inferolateral T-wave inversions      ASSESSMENT AND PLAN:  1.  Atrial flutter, unspecified:  Maintaining NSR. It was recommended that she remain on anticoagulation therapy for at least 30 days after cardioversion and until improved ejection fraction is documented. There was also some consideration of referral to electrophysiology. I reviewed her case today with Dr. Mayford Knife.    -  Continue Xarelto 20 mg daily. This will be refilled.    -  Continue beta blocker. Toprol-XL 100 mg daily will be resumed.    -  Refer to electrophysiology for consideration of RFCA.    -  Arrange follow-up echocardiogram. 2.  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    -  Refer to EP as noted for possible ablation of atrial flutter.    -  Arrange follow-up echocardiogram now to assess for improved LV function.    -  Continue ACE inhibitor, beta blocker.    -  I advised patient against becoming pregnant now given to teratogenicity of ACE inhibitor and potential complications of pregnancy (? Peripartum cardiomyopathy). 3.  Essential hypertension:  Uncontrolled. Resume ACE inhibitor and beta blocker.    -  BMET in one week.  Disposition:   FU with Dr. Armanda Magic 1 month.   Signed, Brynda Rim, MHS 06/24/2014 12:29 PM    [facility] [address] [place], [place], [place]  [address] Phone: [phone]; Fax: [phone]

## 2014-06-28 ENCOUNTER — Telehealth: Payer: Self-pay | Admitting: Physician Assistant

## 2014-06-28 NOTE — Telephone Encounter (Signed)
s/w pt per Bing Neighbors. PA and Dr. Mayford Knife recommendations to schedule echo to be next week instead of 08/2014, also refer to EP dx A-flutter. Pt verbalized understanding to all instructions and will wait for our call for appts.

## 2014-06-28 NOTE — Telephone Encounter (Signed)
Please tell patient that I reviewed with Dr. Armanda Magic. We want to set her up to see EP to evaluate AFlutter. Also, we want to set her up for a repeat Echo now instead of waiting until January. Thanks, Tereso Newcomer, PA-C   06/28/2014 2:16 PM

## 2014-07-10 ENCOUNTER — Other Ambulatory Visit (HOSPITAL_COMMUNITY): Payer: Medicaid Other

## 2014-07-16 ENCOUNTER — Encounter: Payer: Self-pay | Admitting: Physician Assistant

## 2014-07-16 ENCOUNTER — Ambulatory Visit (HOSPITAL_COMMUNITY): Payer: Medicaid Other | Attending: Cardiology | Admitting: Radiology

## 2014-07-16 ENCOUNTER — Telehealth: Payer: Self-pay | Admitting: *Deleted

## 2014-07-16 DIAGNOSIS — I429 Cardiomyopathy, unspecified: Secondary | ICD-10-CM | POA: Diagnosis not present

## 2014-07-16 DIAGNOSIS — I4892 Unspecified atrial flutter: Secondary | ICD-10-CM | POA: Diagnosis not present

## 2014-07-16 NOTE — Progress Notes (Signed)
Echocardiogram performed.  

## 2014-07-16 NOTE — Telephone Encounter (Signed)
pt notified about echo results with EF improved back into normal range. Pt said thank you very much for the good news.

## 2014-07-17 ENCOUNTER — Telehealth: Payer: Self-pay | Admitting: Cardiology

## 2014-07-17 NOTE — Telephone Encounter (Signed)
Patient wants to take Theraflu. Instructed patient to check with a pharmacist before using in case there have been reactions between Theraflu and Xarelto.  Informed patient this message would be sent to [facility] for verification, but if she wants to take tonight (like she mentioned), she can ask her pharmacist.

## 2014-07-17 NOTE — Telephone Encounter (Signed)
New message     Wants to know can she take over the counter medication with her current medication.

## 2014-07-22 ENCOUNTER — Institutional Professional Consult (permissible substitution): Payer: Medicaid Other | Admitting: Internal Medicine

## 2014-07-25 ENCOUNTER — Ambulatory Visit (INDEPENDENT_AMBULATORY_CARE_PROVIDER_SITE_OTHER): Payer: Medicaid Other | Admitting: Cardiology

## 2014-07-25 ENCOUNTER — Encounter: Payer: Self-pay | Admitting: Cardiology

## 2014-07-25 VITALS — BP 160/120 | HR 67 | Ht 66.0 in | Wt 234.0 lb

## 2014-07-25 DIAGNOSIS — I43 Cardiomyopathy in diseases classified elsewhere: Secondary | ICD-10-CM

## 2014-07-25 DIAGNOSIS — I4892 Unspecified atrial flutter: Secondary | ICD-10-CM

## 2014-07-25 DIAGNOSIS — R000 Tachycardia, unspecified: Secondary | ICD-10-CM | POA: Insufficient documentation

## 2014-07-25 DIAGNOSIS — I10 Essential (primary) hypertension: Secondary | ICD-10-CM | POA: Insufficient documentation

## 2014-07-25 DIAGNOSIS — I429 Cardiomyopathy, unspecified: Secondary | ICD-10-CM

## 2014-07-25 DIAGNOSIS — I517 Cardiomegaly: Secondary | ICD-10-CM | POA: Insufficient documentation

## 2014-07-25 MED ORDER — LISINOPRIL 20 MG PO TABS: 20.0000 mg | ORAL_TABLET | Freq: Every day | ORAL | Status: DC

## 2014-07-25 NOTE — Patient Instructions (Addendum)
Your physician has recommended you make the following change in your medication:  1) INCREASE LISINOPRIL to 20 mg daily  Your physician recommends that you schedule a follow-up appointment in: 1 week for a NURSE VISIT BP CHECK.  You will have blood drawn on the same day.  You have been referred to [facility] AS SOON AS POSSIBLE.  Your physician wants you to follow-up in: 6 months with Dr. Mayford Knife. You will receive a reminder letter in the mail two months in advance. If you don't receive a letter, please call our office to schedule the follow-up appointment.

## 2014-07-25 NOTE — Progress Notes (Signed)
[address] [place], [place]  [address] Phone: [phone] Fax:  [phone]  Date:  07/25/2014   ID:  Tracy Grant, DOB 12/14/1985, MRN [id number]  PCP:  Tracy Reaper, Tracy Grant  Cardiologist:  Tracy Magic, MD    History of Present Illness: Tracy Grant is a 28 y.o. female with a history of borderline hypertension, HL. Patient recently gave birth in June 2015. Pregnancy and delivery was uncomplicated and she delivered by C-section.   She was  admitted 10/1-10/4 with atrial flutter with RVR. Patient had noted intermittent palpitations since giving birth. Valsalva and adenosine were used unsuccessfully to convert her to normal rhythm. She was seen by cardiology. She was placed on rate control therapy with diltiazem IV drip. Echocardiogram demonstrated reduced LV function with an EF of 35-40%. She underwent TEE guided cardioversion with restoration of normal sinus rhythm. She was anticoagulated with heparin and transitioned to Xarelto. She was placed on beta blocker and ACE inhibitor therapy for her cardiomyopathy. It was questioned whether or not she had tachycardia-induced cardiomyopathy versus peripartum cardiomyopathy. It was recommended that she remain on anticoagulation therapy at least until she has a follow-up echocardiogram (to document recovery of LVF). Of note, chest x-ray demonstrated a persistent infiltrate and she was treated with antibiotics for possible community-acquired pneumonia.  She  Was ween in November for follow-up with Tracy Newcomer, Tracy Grant. She ran out of all of her medications several days prior to that OV. She had no refills. She was doing well at that time.  Repeat 2D echo showed resolution of DCM with EF 55%.   She presents back for followup today.  The patient denies any chest pain, significant dyspnea, syncope, orthopnea, PND, edema.    Wt Readings from Last 3 Encounters:  07/25/14 234 lb (106.142 kg)  06/24/14 236 lb (107.049 kg)  05/19/14 237  lb 8 oz (107.729 kg)     Past Medical History  Diagnosis Date  . Hx of migraines   . Hyperlipidemia   . Allergy   . History of chicken pox   . Headache(784.0)   . Infection     trich  . Cardiomyopathy     a. Echo (10/15):  EF 35-40%, mild MR, mild to moderately reduced RV systolic function, PASP 31 mmHg  ; b.  Echo (12/15):  Posterior basal HK, mild LVH, EF 55%, mild LAE.    Current Outpatient Prescriptions  Medication Sig Dispense Refill  . acetaminophen (TYLENOL) 325 MG tablet Take 2 tablets (650 mg total) by mouth every 6 (six) hours as needed for mild pain (or Fever >/= 101).    Marland Kitchen lisinopril (PRINIVIL,ZESTRIL) 10 MG tablet Take 1 tablet (10 mg total) by mouth daily. 30 tablet 11  . metoprolol succinate (TOPROL-XL) 100 MG 24 hr tablet Take 1 tablet (100 mg total) by mouth daily. Take with or immediately following a meal. 30 tablet 11  . rivaroxaban (XARELTO) 20 MG TABS tablet Take 1 tablet (20 mg total) by mouth daily. 30 tablet 11   No current facility-administered medications for this visit.    Allergies:   No Known Allergies  Social History:  The patient  reports that she has quit smoking. She has never used smokeless tobacco. She reports that she drinks alcohol. She reports that she does not use illicit drugs.   Family History:  The patient's family history includes Heart attack in her mother; Hypertension in her father, maternal grandfather, maternal grandmother, mother, paternal grandfather, and paternal  grandmother.   ROS:  Please see the history of present illness.      All other systems reviewed and negative.   PHYSICAL EXAM: VS:  BP 160/120 mmHg  Pulse 67  Ht 5\' 6"  (1.676 m)  Wt 234 lb (106.142 kg)  BMI 37.79 kg/m2 Well nourished, well developed, in no acute distress HEENT: normal Neck: no JVD Cardiac:  normal S1, S2; RRR; no murmur Lungs:  clear to auscultation bilaterally, no wheezing, rhonchi or rales Abd: soft, nontender, no hepatomegaly Ext: no  edema Skin: warm and dry Neuro:  CNs 2-12 intact, no focal abnormalities noted  EKG:  NSR with inferior ST/T wave abnormality most likely secondary to HTN     ASSESSMENT AND PLAN:  1. Atrial flutter, unspecified: Maintaining NSR. Her EF is now back to normal  - Continue Xarelto - her CHADS2VASC is 2 (female and HTN)  - Continue beta blocker.   - Refer to electrophysiology for consideration of RFCA .  She has an appt with Dr. Graciela HusbandsKlein in a few weeks.   2. 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 Resolved by recent 2D echo  - Continue ACE inhibitor, beta blocker.  - I advised patient against becoming pregnant now given to teratogenicity of ACE inhibitor and Xarelto and potential complications of pregnancy with recent DCM.  She does not have an OB/GYN. I will get her an appt with the teaching service so she can get on birth control.   3. Hypertension: Poorly controlled.  She missed a dose of her meds a few days ago.  Continue ACE inhibitor and beta blocker.  I will increase her Lisinopril to 20mg  daily  4.  Abnormal EKG with T wave inversions in inferior leads most likely secondary to LVF strain from poorly controlled HTN.  2D echo showed normal LVF with LVH  Followup with nurse in 1 week for BP check and BMET  Followup with me in 6 months   Signed, Tracy Magicraci Verlene Glantz, MD [facility] 07/25/2014 4:14 PM

## 2014-08-02 ENCOUNTER — Ambulatory Visit (INDEPENDENT_AMBULATORY_CARE_PROVIDER_SITE_OTHER): Payer: Medicaid Other

## 2014-08-02 VITALS — BP 144/86 | HR 76 | Ht 66.0 in | Wt 240.8 lb

## 2014-08-02 DIAGNOSIS — I10 Essential (primary) hypertension: Secondary | ICD-10-CM

## 2014-08-02 NOTE — Progress Notes (Signed)
Pt has no complaints. Spoke with Dr. Mayford Knife, no new orders placed.

## 2014-08-02 NOTE — Patient Instructions (Signed)
Your physician recommends that you continue on your current medications as directed. Please refer to the Current Medication list given to you today.     

## 2014-08-06 ENCOUNTER — Institutional Professional Consult (permissible substitution): Payer: Medicaid Other | Admitting: Internal Medicine

## 2014-08-12 ENCOUNTER — Encounter (HOSPITAL_COMMUNITY): Payer: Self-pay | Admitting: *Deleted

## 2014-08-12 ENCOUNTER — Encounter: Payer: Self-pay | Admitting: *Deleted

## 2014-08-12 ENCOUNTER — Emergency Department (INDEPENDENT_AMBULATORY_CARE_PROVIDER_SITE_OTHER)
Admission: EM | Admit: 2014-08-12 | Discharge: 2014-08-12 | Disposition: A | Discharge status: Home / Self Care | Payer: Medicaid Other | Source: Home / Self Care | Attending: Family Medicine | Admitting: Family Medicine

## 2014-08-12 ENCOUNTER — Inpatient Hospital Stay (HOSPITAL_COMMUNITY)
Admission: AD | Admit: 2014-08-12 | Discharge: 2014-08-12 | Disposition: A | Discharge status: Home / Self Care | Payer: Medicaid Other | Source: Ambulatory Visit | Attending: Family Medicine | Admitting: Family Medicine

## 2014-08-12 ENCOUNTER — Encounter (HOSPITAL_COMMUNITY): Payer: Self-pay | Admitting: Emergency Medicine

## 2014-08-12 DIAGNOSIS — N921 Excessive and frequent menstruation with irregular cycle: Secondary | ICD-10-CM

## 2014-08-12 DIAGNOSIS — N920 Excessive and frequent menstruation with regular cycle: Secondary | ICD-10-CM | POA: Insufficient documentation

## 2014-08-12 DIAGNOSIS — Z87891 Personal history of nicotine dependence: Secondary | ICD-10-CM | POA: Diagnosis not present

## 2014-08-12 HISTORY — DX: Essential (primary) hypertension: I10

## 2014-08-12 LAB — APTT: APTT: 33 s (ref 24–37)

## 2014-08-12 LAB — CBC
HEMATOCRIT: 37 % (ref 36.0–46.0)
HEMOGLOBIN: 12.7 g/dL (ref 12.0–15.0)
MCH: 32.3 pg (ref 26.0–34.0)
MCHC: 34.3 g/dL (ref 30.0–36.0)
MCV: 94.1 fL (ref 78.0–100.0)
Platelets: 195 10*3/uL (ref 150–400)
RBC: 3.93 MIL/uL (ref 3.87–5.11)
RDW: 12.2 % (ref 11.5–15.5)
WBC: 4.9 10*3/uL (ref 4.0–10.5)

## 2014-08-12 LAB — PROTIME-INR
INR: 1.13 (ref 0.00–1.49)
PROTHROMBIN TIME: 14.6 s (ref 11.6–15.2)

## 2014-08-12 MED ORDER — MEGESTROL ACETATE 40 MG PO TABS: ORAL_TABLET | ORAL | Status: DC

## 2014-08-12 NOTE — ED Provider Notes (Signed)
CSN: [id number]     Arrival date & time 08/12/14  1441 History   First MD Initiated Contact with Patient 08/12/14 1525     Chief Complaint  Patient presents with  . Vaginal Bleeding   (Consider location/radiation/quality/duration/timing/severity/associated sxs/prior Treatment) Patient is a 28 y.o. female presenting with vaginal bleeding. The history is provided by the patient.  Vaginal Bleeding Quality:  Heavier than menses Severity:  Severe Onset quality:  Sudden Duration:  1 day Timing:  Constant Progression:  Worsening Chronicity:  Recurrent (problem since c section del in june 2015. heavy irreg menses since.) Menstrual history:  Irregular Number of pads used:  3 this am Relieved by:  None tried Worsened by:  Nothing tried Ineffective treatments:  None tried Associated symptoms: abdominal pain   Risk factors: bleeding disorder   Risk factors comment:  On xarelto anticoag.   Past Medical History  Diagnosis Date  . Hx of migraines   . Hyperlipidemia   . Allergy   . History of chicken pox   . Headache(784.0)   . Infection     trich  . Cardiomyopathy     a. Echo (10/15):  EF 35-40%, mild MR, mild to moderately reduced RV systolic function, PASP 31 mmHg  ; b.  Echo (12/15):  Posterior basal HK, mild LVH, EF 55%, mild LAE.   Past Surgical History  Procedure Laterality Date  . Adenoidectomy    . Dilation and curettage of uterus    . Cesarean section N/A 01/17/2014    Procedure: CESAREAN SECTION;  Surgeon: Brock Bad, MD;  Location: [facility];  Service: Obstetrics;  Laterality: N/A;   Family History  Problem Relation Age of Onset  . Hypertension Mother   . Hypertension Father   . Hypertension Maternal Grandmother   . Hypertension Maternal Grandfather   . Hypertension Paternal Grandmother   . Hypertension Paternal Grandfather   . Heart attack Mother     received stent in her early 77s   History  Substance Use Topics  . Smoking status: Former Games developer  . Smokeless  tobacco: Never Used     Comment: stopped with positive preg  test  . Alcohol Use: Yes   OB History    Gravida Para Term Preterm AB TAB SAB Ectopic Multiple Living   3 1 1  0 2 0 2 0 0 1     Review of Systems  Constitutional: Negative.   Gastrointestinal: Positive for abdominal pain.  Genitourinary: Positive for vaginal bleeding, menstrual problem and pelvic pain.    Allergies  Review of patient's allergies indicates no known allergies.  Home Medications   Prior to Admission medications   Medication Sig Start Date End Date Taking? Authorizing Provider  acetaminophen (TYLENOL) 325 MG tablet Take 2 tablets (650 mg total) by mouth every 6 (six) hours as needed for mild pain (or Fever >/= 101). 05/19/14   [name], MD  lisinopril (PRINIVIL,ZESTRIL) 20 MG tablet Take 1 tablet (20 mg total) by mouth daily. 07/25/14   Quintella Reichert, MD  metoprolol succinate (TOPROL-XL) 100 MG 24 hr tablet Take 1 tablet (100 mg total) by mouth daily. Take with or immediately following a meal. 06/24/14   Beatrice Lecher, PA-C  rivaroxaban (XARELTO) 20 MG TABS tablet Take 1 tablet (20 mg total) by mouth daily. 06/24/14   Beatrice Lecher, PA-C   BP 147/93 mmHg  Pulse 78  Temp(Src) 100.3 F (37.9 C) (Oral)  Resp 16  SpO2 100%  LMP 08/11/2014  Physical Exam  Constitutional: She is oriented to person, place, and time. She appears well-developed and well-nourished.  Abdominal: Soft. Bowel sounds are normal. She exhibits no distension and no mass. There is no hepatosplenomegaly. There is tenderness in the suprapubic area. There is no rigidity, no rebound, no guarding and no CVA tenderness.  Neurological: She is alert and oriented to person, place, and time.  Skin: Skin is warm and dry.  Nursing note and vitals reviewed.   ED Course  Procedures (including critical care time) Labs Review Labs Reviewed - No data to display  Imaging Review No results found.   MDM   1. Menorrhagia with irregular  cycle        Linna HoffJames D Shalimar Mcclain, MD 08/12/14 1540

## 2014-08-12 NOTE — Discharge Instructions (Signed)
Levonorgestrel intrauterine device (IUD) What is this medicine? LEVONORGESTREL IUD (LEE voe nor jes trel) is a contraceptive (birth control) device. The device is placed inside the uterus by a healthcare professional. It is used to prevent pregnancy and can also be used to treat heavy bleeding that occurs during your period. Depending on the device, it can be used for 3 to 5 years. This medicine may be used for other purposes; ask your health care provider or pharmacist if you have questions. COMMON BRAND NAME(S): LILETTA, Mirena, Skyla What should I tell my health care provider before I take this medicine? They need to know if you have any of these conditions: -abnormal Pap smear -cancer of the breast, uterus, or cervix -diabetes -endometritis -genital or pelvic infection now or in the past -have more than one sexual partner or your partner has more than one partner -heart disease -history of an ectopic or tubal pregnancy -immune system problems -IUD in place -liver disease or tumor -problems with blood clots or take blood-thinners -use intravenous drugs -uterus of unusual shape -vaginal bleeding that has not been explained -an unusual or allergic reaction to levonorgestrel, other hormones, silicone, or polyethylene, medicines, foods, dyes, or preservatives -pregnant or trying to get pregnant -breast-feeding How should I use this medicine? This device is placed inside the uterus by a health care professional. Talk to your pediatrician regarding the use of this medicine in children. Special care may be needed. Overdosage: If you think you have taken too much of this medicine contact a poison control center or emergency room at once. NOTE: This medicine is only for you. Do not share this medicine with others. What if I miss a dose? This does not apply. What may interact with this medicine? Do not take this medicine with any of the following  medications: -amprenavir -bosentan -fosamprenavir This medicine may also interact with the following medications: -aprepitant -barbiturate medicines for inducing sleep or treating seizures -bexarotene -griseofulvin -medicines to treat seizures like carbamazepine, ethotoin, felbamate, oxcarbazepine, phenytoin, topiramate -modafinil -pioglitazone -rifabutin -rifampin -rifapentine -some medicines to treat HIV infection like atazanavir, indinavir, lopinavir, nelfinavir, tipranavir, ritonavir -St. John's wort -warfarin This list may not describe all possible interactions. Give your health care provider a list of all the medicines, herbs, non-prescription drugs, or dietary supplements you use. Also tell them if you smoke, drink alcohol, or use illegal drugs. Some items may interact with your medicine. What should I watch for while using this medicine? Visit your doctor or health care professional for regular check ups. See your doctor if you or your partner has sexual contact with others, becomes HIV positive, or gets a sexual transmitted disease. This product does not protect you against HIV infection (AIDS) or other sexually transmitted diseases. You can check the placement of the IUD yourself by reaching up to the top of your vagina with clean fingers to feel the threads. Do not pull on the threads. It is a good habit to check placement after each menstrual period. Call your doctor right away if you feel more of the IUD than just the threads or if you cannot feel the threads at all. The IUD may come out by itself. You may become pregnant if the device comes out. If you notice that the IUD has come out use a backup birth control method like condoms and call your health care provider. Using tampons will not change the position of the IUD and are okay to use during your period. What side effects may   I notice from receiving this medicine? Side effects that you should report to your doctor or  health care professional as soon as possible: -allergic reactions like skin rash, itching or hives, swelling of the face, lips, or tongue -fever, flu-like symptoms -genital sores -high blood pressure -no menstrual period for 6 weeks during use -pain, swelling, warmth in the leg -pelvic pain or tenderness -severe or sudden headache -signs of pregnancy -stomach cramping -sudden shortness of breath -trouble with balance, talking, or walking -unusual vaginal bleeding, discharge -yellowing of the eyes or skin Side effects that usually do not require medical attention (report to your doctor or health care professional if they continue or are bothersome): -acne -breast pain -change in sex drive or performance -changes in weight -cramping, dizziness, or faintness while the device is being inserted -headache -irregular menstrual bleeding within first 3 to 6 months of use -nausea This list may not describe all possible side effects. Call your doctor for medical advice about side effects. You may report side effects to FDA at 1-800-FDA-1088. Where should I keep my medicine? This does not apply. NOTE: This sheet is a summary. It may not cover all possible information. If you have questions about this medicine, talk to your doctor, pharmacist, or health care provider.  2015, Elsevier/Gold Standard. (2011-09-02 13:54:04)  

## 2014-08-12 NOTE — MAU Note (Signed)
Period started yesterday.  Between 6-9 this morning, had 3 episodes of heavy bleeding- she would get up and flood the pads and her clothes. Has continued

## 2014-08-12 NOTE — MAU Note (Signed)
Sent over from [facility]

## 2014-08-12 NOTE — Discharge Instructions (Signed)
Go directly to [facility] for further eval and treatment of heavy bleeding.

## 2014-08-12 NOTE — MAU Provider Note (Signed)
Sent for [facility] for evaluation of heavy menstrual bleeding that started yesterday.  She had a c/s back in June and has not been on birth control.  She has had irregular (25-30 day cycle) and heavy menses since June, but today it is heavier than normal.  She had a cardioversion 11/09 for atrial flutter and was placed on Xarelto and continued d/t "CHADS2VASC=2 (Female and HTN) indefinitely.  She was advised to start birth control, but doesn't have an appt with [facility] (medicaid card [facility]) until next week. She claims abstinence as her method of birth control.     ED notes from [facility] are as follows:    Linna HoffJames D Kindl, MD Physician Signed Family Medicine ED Provider Notes 08/12/2014 3:35 PM  Related encounter: [facility]    Expand All Collapse All   CSN: [id number] Arrival date & time 08/12/14 1441 History  First MD Initiated Contact with Patient 08/12/14 1525   Chief Complaint  Patient presents with  . Vaginal Bleeding   (Consider location/radiation/quality/duration/timing/severity/associated sxs/prior Treatment) Patient is a 28 y.o. female presenting with vaginal bleeding. The history is provided by the patient.  Vaginal Bleeding Quality: Heavier than menses Severity: Severe Onset quality: Sudden Duration: 1 day Timing: Constant Progression: Worsening Chronicity: Recurrent (problem since c section del in june 2015. heavy irreg menses since.) Menstrual history: Irregular Number of pads used: 3 this am Relieved by: None tried Worsened by: Nothing tried Ineffective treatments: None tried Associated symptoms: abdominal pain  Risk factors: bleeding disorder  (ACTUALLY, PT IS ON XARELTO FOLLOWING A CARDIOVERSION FOR ATRIAL FLUTTER CRESENZO-DISHMAN,Karra Pink)  Risk factors comment: On xarelto anticoag.   Past Medical History  Diagnosis Date  . Hx of migraines   .  Hyperlipidemia   . Allergy   . History of chicken pox   . Headache(784.0)   . Infection     trich  . Cardiomyopathy     a. Echo (10/15): EF 35-40%, mild MR, mild to moderately reduced RV systolic function, PASP 31 mmHg ; b. Echo (12/15): Posterior basal HK, mild LVH, EF 55%, mild LAE.   Past Surgical History  Procedure Laterality Date  . Adenoidectomy    . Dilation and curettage of uterus    . Cesarean section N/A 01/17/2014    Procedure: CESAREAN SECTION; Surgeon: Brock Badharles A Harper, MD; Location: [facility]; Service: Obstetrics; Laterality: N/A;   Family History  Problem Relation Age of Onset  . Hypertension Mother   . Hypertension Father   . Hypertension Maternal Grandmother   . Hypertension Maternal Grandfather   . Hypertension Paternal Grandmother   . Hypertension Paternal Grandfather   . Heart attack Mother     received stent in her early 5140s   History  Substance Use Topics  . Smoking status: Former Games developermoker  . Smokeless tobacco: Never Used     Comment: stopped with positive preg test  . Alcohol Use: Yes   OB History    Gravida Para Term Preterm AB TAB SAB Ectopic Multiple Living   3 1 1  0 2 0 2 0 0 1     Review of Systems  Constitutional: Negative.  Gastrointestinal: Positive for abdominal pain.  Genitourinary: Positive for vaginal bleeding, menstrual problem and pelvic pain.    Allergies  Review of patient's allergies indicates no known allergies.  Home Medications   Prior to Admission medications   Medication Sig Start Date End Date Taking? Authorizing Provider  acetaminophen (TYLENOL) 325 MG tablet Take 2 tablets (650 mg total) by mouth every 6 (six) hours as needed for mild pain (or Fever >/= 101). 05/19/14   [name], MD  lisinopril (PRINIVIL,ZESTRIL) 20 MG tablet Take 1 tablet (20 mg total) by mouth daily.  07/25/14   Quintella Reichert, MD  metoprolol succinate (TOPROL-XL) 100 MG 24 hr tablet Take 1 tablet (100 mg total) by mouth daily. Take with or immediately following a meal. 06/24/14   Beatrice Lecher, PA-C  rivaroxaban (XARELTO) 20 MG TABS tablet Take 1 tablet (20 mg total) by mouth daily. 06/24/14   Beatrice Lecher, PA-C   BP 147/93 mmHg  Pulse 78  Temp(Src) 100.3 F (37.9 C) (Oral)  Resp 16  SpO2 100%  LMP 08/11/2014 Physical Exam  Constitutional: She is oriented to person, place, and time. She appears well-developed and well-nourished.  Abdominal: Soft. Bowel sounds are normal. She exhibits no distension and no mass. There is no hepatosplenomegaly. There is tenderness in the suprapubic area. There is no rigidity, no rebound, no guarding and no CVA tenderness.  Neurological: She is alert and oriented to person, place, and time.  Skin: Skin is warm and dry.  Nursing note and vitals reviewed.   ED Course  Procedures (including critical care time) Labs Review Labs Reviewed - No data to display  Imaging Review  Imaging Results (Last 48 hours)    No results found.     MDM   1. Menorrhagia with irregular cycle        Linna Hoff, MD 08/12/14 1540            PEVLIC EXAM:  Small amount of dark red menstrual blood in vagina.  Scant amount of blood on pad (has had on for about 45 minutes).  Results for orders placed or performed during the hospital encounter of 08/12/14 (from the past 24 hour(s))  APTT     Status: None   Collection Time: 08/12/14  5:04 PM  Result Value Ref Range   aPTT 33 24 - 37 seconds  CBC     Status: None   Collection Time: 08/12/14  5:04 PM  Result Value Ref Range   WBC 4.9 4.0 - 10.5 K/uL   RBC 3.93 3.87 - 5.11 MIL/uL   Hemoglobin 12.7 12.0 - 15.0 g/dL   HCT 09.3 26.7 - 12.4 %   MCV 94.1 78.0 - 100.0 fL   MCH 32.3 26.0 - 34.0 pg   MCHC 34.3 30.0 - 36.0 g/dL   RDW 58.0 99.8 - 33.8 %   Platelets 195 150 - 400 K/uL   Protime-INR     Status: None   Collection Time: 08/12/14  5:04 PM  Result Value Ref Range   Prothrombin Time 14.6 11.6 - 15.2 seconds   INR 1.13 0.00 - 1.49      ASSESSMENT:  Menorrhagia, ? Worsening d/t Xarelto  PLAN: Discussed with Dr. Adrian Blackwater.  Advised that a Mirena IUD would be ideal for her to control her bleeding.  Megace algorhythm for now (40mg :  3/day for 5 days, 2/day for 5 days, then 1/day until can get birth control).

## 2014-08-12 NOTE — ED Notes (Signed)
Pt states that her menstrual periods are heavy and she is bleeding more than normal.

## 2014-08-13 ENCOUNTER — Encounter: Payer: Self-pay | Admitting: Obstetrics & Gynecology

## 2014-08-15 ENCOUNTER — Encounter: Payer: Self-pay | Admitting: Internal Medicine

## 2014-08-26 ENCOUNTER — Other Ambulatory Visit (HOSPITAL_COMMUNITY): Payer: Medicaid Other

## 2014-10-07 ENCOUNTER — Telehealth: Payer: Self-pay | Admitting: Cardiology

## 2014-10-07 NOTE — Telephone Encounter (Signed)
New message      Patient c/o Palpitations:  High priority if patient c/o lightheadedness and shortness of breath.  1. How long have you been having palpitations? Started today--pt has not taken her medication since last thurs or friday 2. Are you currently experiencing lightheadedness and shortness of breath? no  3. Have you checked your BP and heart rate? (document readings) no--pt is at work 4. Are you experiencing any other symptoms? No Pt is at work and can only talk between 12:15n and 12:45.  Also, she states she will be getting her medication today.

## 2014-10-07 NOTE — Telephone Encounter (Signed)
Patient st this morning she had a quick episode where her heart felt like it was racing but when she checked her pulse it was normal. She st her HR was 70-80 (she checked this manually).  Patient is currently asymptomatic and said she has not had any episodes since. She says she has not taken her medications since Thursday but is on her way to the pharmacy to get them now. Stressed the importance of taking her medications daily as directed and instructed her to follow-up in a couple days or if she has another episode. Patient agrees with treatment plan.

## 2014-10-21 ENCOUNTER — Other Ambulatory Visit (HOSPITAL_COMMUNITY)
Admission: RE | Admit: 2014-10-21 | Discharge: 2014-10-21 | Disposition: A | Discharge status: Home / Self Care | Payer: Medicaid Other | Source: Ambulatory Visit | Attending: Nurse Practitioner | Admitting: Nurse Practitioner

## 2014-10-21 ENCOUNTER — Ambulatory Visit (INDEPENDENT_AMBULATORY_CARE_PROVIDER_SITE_OTHER): Payer: Medicaid Other | Admitting: Nurse Practitioner

## 2014-10-21 ENCOUNTER — Encounter: Payer: Self-pay | Admitting: Nurse Practitioner

## 2014-10-21 VITALS — BP 174/114 | HR 92 | Temp 98.6°F | Ht 60.0 in | Wt 231.9 lb

## 2014-10-21 DIAGNOSIS — Z0000 Encounter for general adult medical examination without abnormal findings: Secondary | ICD-10-CM

## 2014-10-21 DIAGNOSIS — Z309 Encounter for contraceptive management, unspecified: Secondary | ICD-10-CM | POA: Insufficient documentation

## 2014-10-21 DIAGNOSIS — Z113 Encounter for screening for infections with a predominantly sexual mode of transmission: Secondary | ICD-10-CM | POA: Insufficient documentation

## 2014-10-21 DIAGNOSIS — Z01419 Encounter for gynecological examination (general) (routine) without abnormal findings: Secondary | ICD-10-CM | POA: Diagnosis present

## 2014-10-21 DIAGNOSIS — N938 Other specified abnormal uterine and vaginal bleeding: Secondary | ICD-10-CM

## 2014-10-21 DIAGNOSIS — N898 Other specified noninflammatory disorders of vagina: Secondary | ICD-10-CM

## 2014-10-21 DIAGNOSIS — Z3009 Encounter for other general counseling and advice on contraception: Secondary | ICD-10-CM | POA: Insufficient documentation

## 2014-10-21 DIAGNOSIS — Z1331 Encounter for screening for depression: Secondary | ICD-10-CM | POA: Insufficient documentation

## 2014-10-21 NOTE — Progress Notes (Signed)
History:  Tracy Grant is a 29 y.o. [phone] who presents to [facility] clinic today for her annual exam, vaginal discharge and pap smear. She is also interested in contraception. Today her BP is very elevated and she is advised to take her BP medications. She has a history of atrial flutter, cardiomyopathy and HTN. She has been told this is either related to recent birth or family history. She has been advised to not become pregnant again but she is currently having intercourse without protection. She is also having heavy menses ( for past 6 months ) likely related to Xarelto. She would like to have the [place] IUD placed. She will not see her cardiologist again till June.   The following portions of the patient's history were reviewed and updated as appropriate: allergies, current medications, past family history, past medical history, past social history, past surgical history and problem list.  Review of Systems:  Pertinent items are noted in HPI.  Objective:  Physical Exam BP 174/114 mmHg  Pulse 92  Temp(Src) 98.6 F (37 C) (Oral)  Ht 5' (1.524 m)  Wt 231 lb 14.4 oz (105.189 kg)  BMI 45.29 kg/m2  LMP 09/27/2014 (Approximate)  Breastfeeding? No GENERAL: Well-developed, well-nourished female in no acute distress. Morbid obesity with uncontrolled hypertension HEENT: Normocephalic, atraumatic.  NECK: Supple. Normal thyroid.  LUNGS: Normal rate. Clear to auscultation bilaterally.  HEART: Regular rate and rhythm with no adventitious sounds.  ABDOMEN: Soft, nontender, nondistended. No organomegaly. Normal bowel sounds appreciated in all quadrants.  PELVIC: Normal external female genitalia. Vagina is pink and rugated.  Normal discharge. Normal cervix contour. Pap smear obtained. Uterus is normal in size. No adnexal mass or tenderness.  EXTREMITIES: No cyanosis, clubbing, or edema, 2+ distal pulses.   Labs and Imaging No results found for this or any previous visit (from the past 24  hour(s)).  Assessment & Plan:  Assessment:  Uncontrolled Hypertension Heart disease DUB Contraception Management  Plans:  Advised to take BP meds asap Advised to see Cardiology sooner if [name] does not stop bleeding Check CBC, Wet prep, Pap, GC/ Chlamydia Advised protected intercourse for 2 weeks then return for [facility] IUD  Delbert Phenix, NP 10/21/2014 4:27 PM

## 2014-10-21 NOTE — Patient Instructions (Signed)
Levonorgestrel intrauterine device (IUD) What is this medicine? LEVONORGESTREL IUD (LEE voe nor jes trel) is a contraceptive (birth control) device. The device is placed inside the uterus by a healthcare professional. It is used to prevent pregnancy and can also be used to treat heavy bleeding that occurs during your period. Depending on the device, it can be used for 3 to 5 years. This medicine may be used for other purposes; ask your health care provider or pharmacist if you have questions. COMMON BRAND NAME(S): LILETTA, Mirena, Skyla What should I tell my health care provider before I take this medicine? They need to know if you have any of these conditions: -abnormal Pap smear -cancer of the breast, uterus, or cervix -diabetes -endometritis -genital or pelvic infection now or in the past -have more than one sexual partner or your partner has more than one partner -heart disease -history of an ectopic or tubal pregnancy -immune system problems -IUD in place -liver disease or tumor -problems with blood clots or take blood-thinners -use intravenous drugs -uterus of unusual shape -vaginal bleeding that has not been explained -an unusual or allergic reaction to levonorgestrel, other hormones, silicone, or polyethylene, medicines, foods, dyes, or preservatives -pregnant or trying to get pregnant -breast-feeding How should I use this medicine? This device is placed inside the uterus by a health care professional. Talk to your pediatrician regarding the use of this medicine in children. Special care may be needed. Overdosage: If you think you have taken too much of this medicine contact a poison control center or emergency room at once. NOTE: This medicine is only for you. Do not share this medicine with others. What if I miss a dose? This does not apply. What may interact with this medicine? Do not take this medicine with any of the following  medications: -amprenavir -bosentan -fosamprenavir This medicine may also interact with the following medications: -aprepitant -barbiturate medicines for inducing sleep or treating seizures -bexarotene -griseofulvin -medicines to treat seizures like carbamazepine, ethotoin, felbamate, oxcarbazepine, phenytoin, topiramate -modafinil -pioglitazone -rifabutin -rifampin -rifapentine -some medicines to treat HIV infection like atazanavir, indinavir, lopinavir, nelfinavir, tipranavir, ritonavir -St. John's wort -warfarin This list may not describe all possible interactions. Give your health care provider a list of all the medicines, herbs, non-prescription drugs, or dietary supplements you use. Also tell them if you smoke, drink alcohol, or use illegal drugs. Some items may interact with your medicine. What should I watch for while using this medicine? Visit your doctor or health care professional for regular check ups. See your doctor if you or your partner has sexual contact with others, becomes HIV positive, or gets a sexual transmitted disease. This product does not protect you against HIV infection (AIDS) or other sexually transmitted diseases. You can check the placement of the IUD yourself by reaching up to the top of your vagina with clean fingers to feel the threads. Do not pull on the threads. It is a good habit to check placement after each menstrual period. Call your doctor right away if you feel more of the IUD than just the threads or if you cannot feel the threads at all. The IUD may come out by itself. You may become pregnant if the device comes out. If you notice that the IUD has come out use a backup birth control method like condoms and call your health care provider. Using tampons will not change the position of the IUD and are okay to use during your period. What side effects may   I notice from receiving this medicine? Side effects that you should report to your doctor or  health care professional as soon as possible: -allergic reactions like skin rash, itching or hives, swelling of the face, lips, or tongue -fever, flu-like symptoms -genital sores -high blood pressure -no menstrual period for 6 weeks during use -pain, swelling, warmth in the leg -pelvic pain or tenderness -severe or sudden headache -signs of pregnancy -stomach cramping -sudden shortness of breath -trouble with balance, talking, or walking -unusual vaginal bleeding, discharge -yellowing of the eyes or skin Side effects that usually do not require medical attention (report to your doctor or health care professional if they continue or are bothersome): -acne -breast pain -change in sex drive or performance -changes in weight -cramping, dizziness, or faintness while the device is being inserted -headache -irregular menstrual bleeding within first 3 to 6 months of use -nausea This list may not describe all possible side effects. Call your doctor for medical advice about side effects. You may report side effects to FDA at 1-800-FDA-1088. Where should I keep my medicine? This does not apply. NOTE: This sheet is a summary. It may not cover all possible information. If you have questions about this medicine, talk to your doctor, pharmacist, or health care provider.  2015, Elsevier/Gold Standard. (2011-09-02 13:54:04)  

## 2014-10-21 NOTE — Progress Notes (Signed)
Pt states that she has not taken her BP medication last night nor today.  Pt also would like to have [facility] for contraception and management of bleeding but pt has had unprotected sex within the past two weeks.  Advised pt to abstain from intercourse for two weeks, pregnancy test will be done, if negative pt will be able to get IUD. Pt agreed.

## 2014-10-22 LAB — CBC
HEMATOCRIT: 39.8 % (ref 36.0–46.0)
Hemoglobin: 13.4 g/dL (ref 12.0–15.0)
MCH: 31.7 pg (ref 26.0–34.0)
MCHC: 33.7 g/dL (ref 30.0–36.0)
MCV: 94.1 fL (ref 78.0–100.0)
MPV: 11.2 fL (ref 8.6–12.4)
Platelets: 263 10*3/uL (ref 150–400)
RBC: 4.23 MIL/uL (ref 3.87–5.11)
RDW: 12.6 % (ref 11.5–15.5)
WBC: 5.1 10*3/uL (ref 4.0–10.5)

## 2014-10-22 LAB — WET PREP, GENITAL
Trich, Wet Prep: NONE SEEN
Yeast Wet Prep HPF POC: NONE SEEN

## 2014-10-22 LAB — CYTOLOGY - PAP

## 2014-10-23 ENCOUNTER — Telehealth: Payer: Self-pay | Admitting: General Practice

## 2014-10-23 DIAGNOSIS — B379 Candidiasis, unspecified: Secondary | ICD-10-CM

## 2014-10-23 DIAGNOSIS — A749 Chlamydial infection, unspecified: Secondary | ICD-10-CM

## 2014-10-23 MED ORDER — FLUCONAZOLE 150 MG PO TABS: 150.0000 mg | ORAL_TABLET | Freq: Once | ORAL | Status: DC

## 2014-10-23 MED ORDER — AZITHROMYCIN 250 MG PO TABS: 1000.0000 mg | ORAL_TABLET | Freq: Once | ORAL | Status: DC

## 2014-10-23 NOTE — Telephone Encounter (Signed)
-----   Message from Delbert Phenix, NP sent at 10/23/2014  3:05 PM EST ----- Please advised patient she is positive for chlamydia and vaginal yeast. She can have Zithromax 1 Gram po and Diflucan 150 mg x1 dose. Please advise her partner needs to be treated. Thanks, [employer]

## 2014-10-23 NOTE — Telephone Encounter (Signed)
meds ordered. Called patient and informed her of results and medications at pharmacy. Discussed with patient that her partner(s) will need to be treated as well and that she should abstain from intercourse for 2 weeks following treatment. Patient verbalized understanding to all and had no questions. STD card sent

## 2014-10-25 ENCOUNTER — Encounter: Payer: Self-pay | Admitting: Cardiology

## 2014-11-20 ENCOUNTER — Encounter: Payer: Self-pay | Admitting: Obstetrics & Gynecology

## 2014-11-20 ENCOUNTER — Ambulatory Visit (INDEPENDENT_AMBULATORY_CARE_PROVIDER_SITE_OTHER): Payer: Medicaid Other | Admitting: Obstetrics & Gynecology

## 2014-11-20 VITALS — BP 143/99 | HR 93 | Ht 65.0 in | Wt 238.5 lb

## 2014-11-20 DIAGNOSIS — Z30430 Encounter for insertion of intrauterine contraceptive device: Secondary | ICD-10-CM

## 2014-11-20 DIAGNOSIS — Z3202 Encounter for pregnancy test, result negative: Secondary | ICD-10-CM | POA: Diagnosis not present

## 2014-11-20 DIAGNOSIS — Z30014 Encounter for initial prescription of intrauterine contraceptive device: Secondary | ICD-10-CM | POA: Diagnosis present

## 2014-11-20 LAB — POCT PREGNANCY, URINE: PREG TEST UR: NEGATIVE

## 2014-11-20 MED ORDER — LEVONORGESTREL 20 MCG/24HR IU IUD
INTRAUTERINE_SYSTEM | Freq: Once | INTRAUTERINE | Status: AC
  Administered 2014-11-20: 1 via INTRAUTERINE

## 2014-11-20 NOTE — Patient Instructions (Signed)
Intrauterine Device Information  An intrauterine device (IUD) is inserted into your uterus to prevent pregnancy. There are two types of IUDs available:   · Copper IUD--This type of IUD is wrapped in copper wire and is placed inside the uterus. Copper makes the uterus and fallopian tubes produce a fluid that kills sperm. The copper IUD can stay in place for 10 years.  · Hormone IUD--This type of IUD contains the hormone progestin (synthetic progesterone). The hormone thickens the cervical mucus and prevents sperm from entering the uterus. It also thins the uterine lining to prevent implantation of a fertilized egg. The hormone can weaken or kill the sperm that get into the uterus. One type of hormone IUD can stay in place for 5 years, and another type can stay in place for 3 years.  Your health care provider will make sure you are a good candidate for a contraceptive IUD. Discuss with your health care provider the possible side effects.   ADVANTAGES OF AN INTRAUTERINE DEVICE  · IUDs are highly effective, reversible, long acting, and low maintenance.    · There are no estrogen-related side effects.    · An IUD can be used when breastfeeding.    · IUDs are not associated with weight gain.    · The copper IUD works immediately after insertion.    · The hormone IUD works right away if inserted within 7 days of your period starting. You will need to use a backup method of birth control for 7 days if the hormone IUD is inserted at any other time in your cycle.  · The copper IUD does not interfere with your female hormones.    · The hormone IUD can make heavy menstrual periods lighter and decrease cramping.    · The hormone IUD can be used for 3 or 5 years.    · The copper IUD can be used for 10 years.  DISADVANTAGES OF AN INTRAUTERINE DEVICE  · The hormone IUD can be associated with irregular bleeding patterns.    · The copper IUD can make your menstrual flow heavier and more painful.    · You may experience cramping and  vaginal bleeding after insertion.    Document Released: 07/06/2004 Document Revised: 04/04/2013 Document Reviewed: 01/21/2013  ExitCare® Patient Information ©2015 [employer]. This information is not intended to replace advice given to you by your health care provider. Make sure you discuss any questions you have with your health care provider.  Intrauterine Device Insertion, Care After  Refer to this sheet in the next few weeks. These instructions provide you with information on caring for yourself after your procedure. Your health care provider may also give you more specific instructions. Your treatment has been planned according to current medical practices, but problems sometimes occur. Call your health care provider if you have any problems or questions after your procedure.  WHAT TO EXPECT AFTER THE PROCEDURE  Insertion of the IUD may cause some discomfort, such as cramping. The cramping should improve after the IUD is in place. You may have bleeding after the procedure. This is normal. It varies from light spotting for a few days to menstrual-like bleeding.  When the IUD is in place, a string will extend past the cervix into the vagina for 1-2 inches. The strings should not bother you or your partner. If they do, talk to your health care provider.   HOME CARE INSTRUCTIONS   · Check your intrauterine device (IUD) to make sure it is in place before you resume sexual activity. You should be   may have been punctured (perforated) during placement. Also, if the strings are getting longer, it may mean that the IUD is being forced out of the uterus. You no longer have full protection from pregnancy if any of these problems occur.  You may resume sexual intercourse if you are not having problems with the IUD. The copper IUD is considered immediately effective, and the hormone  IUD works right away if inserted within 7 days of your period starting. You will need to use a backup method of birth control for 7 days if the IUD in inserted at any other time in your cycle.  Continue to check that the IUD is still in place by feeling for the strings after every menstrual period.  You may need to take pain medicine such as acetaminophen or ibuprofen. Only take medicines as directed by your health care provider. SEEK MEDICAL CARE IF:   You have bleeding that is heavier or lasts longer than a normal menstrual cycle.  You have a fever.  You have increasing cramps or abdominal pain not relieved with medicine.  You have abdominal pain that does not seem to be related to the same area of earlier cramping and pain.  You are lightheaded, unusually weak, or faint.  You have abnormal vaginal discharge or smells.  You have pain during sexual intercourse.  You cannot feel the IUD strings, or the IUD string has gotten longer.  You feel the IUD at the opening of the cervix in the vagina.  You think you are pregnant, or you miss your menstrual period.  The IUD string is hurting your sex partner. MAKE SURE YOU:  Understand these instructions.  Will watch your condition.  Will get help right away if you are not doing well or get worse. Document Released: 03/31/2011 Document Revised: 05/23/2013 Document Reviewed: 01/21/2013 [facility] Patient Information 2015 [place], [place]. This information is not intended to replace advice given to you by your health care provider. Make sure you discuss any questions you have with your health care provider.

## 2014-11-20 NOTE — Progress Notes (Signed)
Patient ID: Tracy Grant, female   DOB: 18-Sep-1985, 29 y.o.   MRN: [id number] GYNECOLOGY CLINIC PROCEDURE NOTE  Tracy Grant is a 29 y.o. [phone] here for Mirena IUD insertion. No GYN concerns.   IUD Insertion Procedure Note Patient identified, informed consent performed.  Discussed risks of irregular bleeding, cramping, infection, malpositioning or misplacement of the IUD outside the uterus which may require further procedures. Time out was performed.  Urine pregnancy test negative.  Speculum placed in the vagina.  Cervix visualized.  Cleaned with Betadine x 2.  Grasped anteriorly with a single tooth tenaculum.  Uterus sounded to 7 cm.  Mirena IUD placed per manufacturer's recommendations.  Strings trimmed to 3 cm. Tenaculum was removed, good hemostasis noted.  Patient tolerated procedure well.   Patient was given post-procedure instructions.  Patient was also asked to check IUD strings periodically and follow up in 4 weeks for IUD check.

## 2014-12-18 ENCOUNTER — Ambulatory Visit: Payer: Medicaid Other | Admitting: Obstetrics & Gynecology

## 2014-12-27 ENCOUNTER — Ambulatory Visit: Payer: Medicaid Other | Admitting: Obstetrics & Gynecology

## 2015-02-10 ENCOUNTER — Ambulatory Visit: Payer: Medicaid Other | Admitting: Cardiology

## 2015-04-22 NOTE — Progress Notes (Signed)
This encounter was created in error - please disregard.

## 2015-04-23 ENCOUNTER — Encounter: Payer: Medicaid Other | Admitting: Cardiology

## 2015-04-30 ENCOUNTER — Encounter: Payer: Self-pay | Admitting: Cardiology

## 2015-06-10 ENCOUNTER — Telehealth: Payer: Self-pay | Admitting: Cardiology

## 2015-06-10 ENCOUNTER — Ambulatory Visit (INDEPENDENT_AMBULATORY_CARE_PROVIDER_SITE_OTHER): Payer: Self-pay | Admitting: Cardiology

## 2015-06-10 ENCOUNTER — Encounter: Payer: Self-pay | Admitting: Cardiology

## 2015-06-10 VITALS — BP 124/88 | HR 89 | Ht 65.0 in | Wt 227.0 lb

## 2015-06-10 DIAGNOSIS — I429 Cardiomyopathy, unspecified: Secondary | ICD-10-CM

## 2015-06-10 DIAGNOSIS — I10 Essential (primary) hypertension: Secondary | ICD-10-CM

## 2015-06-10 DIAGNOSIS — I4892 Unspecified atrial flutter: Secondary | ICD-10-CM

## 2015-06-10 DIAGNOSIS — Z720 Tobacco use: Secondary | ICD-10-CM

## 2015-06-10 MED ORDER — LISINOPRIL 20 MG PO TABS: 20.0000 mg | ORAL_TABLET | Freq: Every day | ORAL | Status: DC

## 2015-06-10 MED ORDER — ASPIRIN 325 MG PO TABS: 325.0000 mg | ORAL_TABLET | Freq: Every day | ORAL | Status: DC

## 2015-06-10 MED ORDER — METOPROLOL SUCCINATE ER 25 MG PO TB24: 25.0000 mg | ORAL_TABLET | Freq: Every day | ORAL | Status: DC

## 2015-06-10 NOTE — Telephone Encounter (Signed)
Patient is self pay and was giving the application for hardship for [employer].   Once patient return the application I will send it to [facility].

## 2015-06-10 NOTE — Progress Notes (Signed)
Cardiology Office Note   Date:  06/10/2015   ID:  CYRA PINCOCK, DOB [birth year], MRN [id number]  PCP:  No primary care provider on file.  Cardiologist:  Dr. Mayford Knife    Chief Complaint  Patient presents with  . Medication Refill      History of Present Illness: Tracy Grant is a 29 y.o. female who presents for HTN and a flutter hx.   She was admitted 10/1-10/42015- 4 months post partum with atrial flutter with RVR. Patient had noted intermittent palpitations since giving birth. Valsalva and adenosine were used unsuccessfully to convert her to normal rhythm.  She was placed on rate control therapy with diltiazem IV drip. Echocardiogram demonstrated reduced LV function with an EF of 35-40%. She underwent TEE guided cardioversion with restoration of normal sinus rhythm. She was anticoagulated with heparin and transitioned to Xarelto. She was placed on beta blocker and ACE inhibitor therapy for her cardiomyopathy. It was questioned whether or not she had tachycardia-induced cardiomyopathy versus peripartum cardiomyopathy. It was recommended that she remain on anticoagulation therapy at least until she has a follow-up echocardiogram (to document recovery of LVF).   In 07/2014 she had follow up echo with improvement of her EF to 55% - she was to see Dr. Graciela Husbands for consideration of RFCA but did not keep appt.    Her CHA2DS2VASC score of 2 with HTN and female.    Pt has not taken xarelto or metoprolol since June.  She denies any rapid beats or irregular beats.  No chest pain or SOB. She is exercising and eating well has stopped marijuana.     Past Medical History  Diagnosis Date  . Hx of migraines   . Hyperlipidemia   . Allergy   . History of chicken pox   . Headache(784.0)   . Infection     trich  . Cardiomyopathy (HCC)     a. Echo (10/15):  EF 35-40%, mild MR, mild to moderately reduced RV systolic function, PASP 31 mmHg  ; b.  Echo (12/15):  Posterior basal HK, mild LVH, EF  55%, mild LAE.  Marland Kitchen Hypertension     Past Surgical History  Procedure Laterality Date  . Adenoidectomy    . Dilation and curettage of uterus    . Cesarean section N/A 01/17/2014    Procedure: CESAREAN SECTION;  Surgeon: Brock Bad, MD;  Location: [facility];  Service: Obstetrics;  Laterality: N/A;     Current Outpatient Prescriptions  Medication Sig Dispense Refill  . lisinopril (PRINIVIL,ZESTRIL) 20 MG tablet Take 1 tablet (20 mg total) by mouth daily. 30 tablet 11  . metoprolol succinate (TOPROL-XL) 100 MG 24 hr tablet Take 1 tablet (100 mg total) by mouth daily. Take with or immediately following a meal. (Patient not taking: Reported on 06/10/2015) 30 tablet 11  . rivaroxaban (XARELTO) 20 MG TABS tablet Take 1 tablet (20 mg total) by mouth daily. (Patient not taking: Reported on 06/10/2015) 30 tablet 11   No current facility-administered medications for this visit.    Allergies:   Review of patient's allergies indicates no known allergies.    Social History:  The patient  reports that she has been smoking Cigarettes.  She has a 2.5 pack-year smoking history. She has never used smokeless tobacco. She reports that she drinks alcohol. She reports that she does not use illicit drugs.   Family History:  The patient's family history includes Heart attack in her mother; Hypertension in her father, maternal grandfather,  maternal grandmother, mother, paternal grandfather, and paternal grandmother.    ROS:  General:no colds or fevers, + weight decrease Skin:no rashes or ulcers HEENT:no blurred vision, no congestion CV:see HPI PUL:see HPI GI:no diarrhea constipation or melena, no indigestion GU:no hematuria, no dysuria MS:no joint pain, no claudication Neuro:no syncope, no lightheadedness Endo:no diabetes, no thyroid disease  Wt Readings from Last 3 Encounters:  06/10/15 227 lb (102.967 kg)  11/20/14 238 lb 8 oz (108.183 kg)  10/21/14 231 lb 14.4 oz (105.189 kg)     PHYSICAL  EXAM: VS:  BP 124/88 mmHg  Pulse 89  Ht  (1.651 m)  Wt 227 lb (102.967 kg)  BMI 37.77 kg/m2  SpO2 94% , BMI Body mass index is 37.77 kg/(m^2). General:Pleasant affect, NAD Skin:Warm and dry, brisk capillary refill HEENT:normocephalic, sclera clear, mucus membranes moist Neck:supple, no JVD, no bruits  Heart:S1S2 RRR without murmur, gallup, rub or click Lungs:clear without rales, rhonchi, or wheezes ZOX:WRUE, non tender, + BS, do not palpate liver spleen or masses Ext:no lower ext edema, 2+ pedal pulses, 2+ radial pulses Neuro:alert and oriented, MAE, follows commands, + facial symmetry    EKG:  EKG is ordered today. The ekg ordered today demonstrates SR nonspecific intraventricular conduction delay.  Continues with t wave inversion in III no acute changes. Short PR. 96 ms   Recent Labs: 10/21/2014: Hemoglobin 13.4; Platelets 263    Lipid Panel    Component Value Date/Time   CHOL 156 08/08/2012 0903   TRIG 198.0* 08/08/2012 0903   HDL 34.90* 08/08/2012 0903   CHOLHDL 4 08/08/2012 0903   VLDL 39.6 08/08/2012 0903   LDLCALC 82 08/08/2012 0903       Other studies Reviewed: Additional studies/ records that were reviewed today include: ECHO: .Study Conclusions  - Left ventricle: Posterior basal hypokinesis. The cavity size was mildly dilated. Wall thickness was increased in a pattern of mild LVH. The estimated ejection fraction was 55%. Left ventricular diastolic function parameters were normal. - Left atrium: The atrium was mildly dilated. - Atrial septum: No defect or patent foramen ovale was identified.    ASSESSMENT AND PLAN:  1.  PAF, none since post delivery that she is aware.  CHAD2SVASC is 2 pt not off anticoagulation since June.  Discussed with Dr. Katrinka Blazing will do 30 day event monitor.  If PAF she would need Xarelto.  She will follow up with Dr. Mayford Knife for results and long term plan.  She did have vaginal bleeding on anticoagulation.  2 NICM with  improved EF   3.  Tobacco use, we discussed importance of stopping.  She will decrease at least, she smokes due to nerves.    4. HTN on lisinopril, continue.  i am adding toprol back at 25 mg she has been off since June and her BP is controlled. Does not need 100 mg.   Will check with Dr. Mayford Knife if we should again refer to Dr. Graciela Husbands.   Current medicines are reviewed with the patient today.  The patient Has no concerns regarding medicines.  The following changes have been made:  See above Labs/ tests ordered today include:see above  Disposition:   FU:  see above  Signed, Leone Brand, NP  06/10/2015 9:52 AM    [facility] [address] [place], [place], [place]  [address]/ [address] [place], [place] Phone: [phone]; Fax: [phone]  [phone]

## 2015-06-10 NOTE — Patient Instructions (Addendum)
Medication Instructions:  Your physician has recommended you make the following change in your medication:  1.  START Aspirin 325 mg take 1 tablet daily 2.  START Toprol XL 25 mg take 1 tablet daily 3.  CONTINUE the Lisinopril (a refill has been sent to your pharmacy)  Labwork: None ordered  Testing/Procedures: Your physician has recommended that you wear an event monitor. Event monitors are medical devices that record the heart's electrical activity. Doctors most often [place] these monitors to diagnose arrhythmias. Arrhythmias are problems with the speed or rhythm of the heartbeat. The monitor is a small, portable device. You can wear one while you do your normal daily activities. This is usually used to diagnose what is causing palpitations/syncope (passing out).    Follow-Up: Your physician recommends that you schedule a follow-up appointment in:  2 MONTHS With DR. Mayford Knife    Any Other Special Instructions Will Be Listed Below (If Applicable).  Cardiac Event Monitoring A cardiac event monitor is a small recording device used to help detect abnormal heart rhythms (arrhythmias). The monitor is used to record heart rhythm when noticeable symptoms such as the following occur:  Fast heartbeats (palpitations), such as heart racing or fluttering.  Dizziness.  Fainting or light-headedness.  Unexplained weakness. The monitor is wired to two electrodes placed on your chest. Electrodes are flat, sticky disks that attach to your skin. The monitor can be worn for up to 30 days. You will wear the monitor at all times, except when bathing.  HOW TO USE YOUR CARDIAC EVENT MONITOR A technician will prepare your chest for the electrode placement. The technician will show you how to place the electrodes, how to work the monitor, and how to replace the batteries. Take time to practice using the monitor before you leave the office. Make sure you understand how to send the information from the monitor to your  health care provider. This requires a telephone with a landline, not a cell phone. You need to:  Wear your monitor at all times, except when you are in water:  Do not get the monitor wet.  Take the monitor off when bathing. Do not swim or use a hot tub with it on.  Keep your skin clean. Do not put body lotion or moisturizer on your chest.  Change the electrodes daily or any time they stop sticking to your skin. You might need to use tape to keep them on.  It is possible that your skin under the electrodes could become irritated. To keep this from happening, try to put the electrodes in slightly different places on your chest. However, they must remain in the area under your left breast and in the upper right section of your chest.  Make sure the monitor is safely clipped to your clothing or in a location close to your body that your health care provider recommends.  Press the button to record when you feel symptoms of heart trouble, such as dizziness, weakness, light-headedness, palpitations, thumping, shortness of breath, unexplained weakness, or a fluttering or racing heart. The monitor is always on and records what happened slightly before you pressed the button, so do not worry about being too late to get good information.  Keep a diary of your activities, such as walking, doing chores, and taking medicine. It is especially important to note what you were doing when you pushed the button to record your symptoms. This will help your health care provider determine what might be contributing to your symptoms.  The information stored in your monitor will be reviewed by your health care provider alongside your diary entries.  Send the recorded information as recommended by your health care provider. It is important to understand that it will take some time for your health care provider to process the results.  Change the batteries as recommended by your health care provider. SEEK IMMEDIATE  MEDICAL CARE IF:   You have chest pain.  You have extreme difficulty breathing or shortness of breath.  You develop a very fast heartbeat that persists.  You develop dizziness that does not go away.  You faint or constantly feel you are about to faint.   This information is not intended to replace advice given to you by your health care provider. Make sure you discuss any questions you have with your health care provider.   Document Released: 05/11/2008 Document Revised: 08/23/2014 Document Reviewed: 01/29/2013 Elsevier Interactive Patient Education [employer]   If you need a refill on your cardiac medications before your next appointment, please call your pharmacy.

## 2015-06-11 ENCOUNTER — Telehealth: Payer: Self-pay | Admitting: *Deleted

## 2015-06-11 NOTE — Telephone Encounter (Signed)
Left message for pt to return my call re: appt to see Dr. Graciela Husbands, per Nada Boozer, NP, and Dr. Mayford Knife, for possible ablation.  Appt has already been made for 06-23-15 @ 9:45.

## 2015-06-11 NOTE — Telephone Encounter (Signed)
Pt returned my call and she has been made aware of her appt with Dr. Graciela Husbands and has verbalized understanding.

## 2015-06-23 ENCOUNTER — Encounter: Payer: Self-pay | Admitting: Internal Medicine

## 2015-06-23 ENCOUNTER — Encounter: Payer: Self-pay | Admitting: *Deleted

## 2015-06-23 ENCOUNTER — Ambulatory Visit (INDEPENDENT_AMBULATORY_CARE_PROVIDER_SITE_OTHER): Payer: Self-pay | Admitting: Internal Medicine

## 2015-06-23 VITALS — BP 130/100 | HR 72 | Ht 65.0 in | Wt 231.8 lb

## 2015-06-23 DIAGNOSIS — I4892 Unspecified atrial flutter: Secondary | ICD-10-CM

## 2015-06-23 NOTE — Patient Instructions (Signed)
Medication Instructions: - Stop Aspirin  Labwork: - none  Procedures/Testing: - none  Follow-Up: - Your physician recommends that you schedule a follow-up appointment in: 2-3 months with Dr. Graciela Husbands.  Any Additional Special Instructions Will Be Listed Below (If Applicable). - none

## 2015-06-23 NOTE — Progress Notes (Signed)
ELECTROPHYSIOLOGY CONSULT NOTE  Patient ID: Tracy Grant, MRN: [id number], DOB/AGE: 1986/01/22 29 y.o. Admit date: (Not on file) Date of Consult: 06/23/2015  Primary Physician: No PCP Per Patient Primary Cardiologist: TT  Chief Complaint: atiral flutter   HPI Tracy Grant is a 29 y.o. female  Referred following episode of atrial flutter that was identified fall 2015.  This occurred about 4 months following the birth of her first child. She presented with symptoms of heart failure. Echocardiogram demonstrated significant LV dysfunction with an EF of 35-40%. She underwent cardioversion supported by Rivaroxaban which she has subsequently discontinued.  Repeat echocardiogram 12/15 demonstrated near normalization of LV function. She has had no intercurrent arrhythmia.  She has modest chronic shortness of breath. She is exercising. She denies peripheral edema. She lives by herself is unaware of whether she snores.  She's had no recurrent tachypalpitations.  She smokes. She admits to a great deal of anxiety and a prior diagnosis of "bipolar". She notes that her family psychiatry and counseling were not acceptable solutions.     Past Medical History  Diagnosis Date  . Hx of migraines   . Hyperlipidemia   . Allergy   . History of chicken pox   . Headache(784.0)   . Infection     trich  . Cardiomyopathy (HCC)     a. Echo (10/15):  EF 35-40%, mild MR, mild to moderately reduced RV systolic function, PASP 31 mmHg  ; b.  Echo (12/15):  Posterior basal HK, mild LVH, EF 55%, mild LAE.  Marland Kitchen Hypertension       Surgical History:  Past Surgical History  Procedure Laterality Date  . Adenoidectomy    . Dilation and curettage of uterus    . Cesarean section N/A 01/17/2014    Procedure: CESAREAN SECTION;  Surgeon: Brock Bad, MD;  Location: [facility];  Service: Obstetrics;  Laterality: N/A;     Home Meds: Prior to Admission medications   Medication Sig Start Date End Date  Taking? Authorizing Provider  lisinopril (PRINIVIL,ZESTRIL) 20 MG tablet Take 1 tablet (20 mg total) by mouth daily. 06/10/15  Yes Leone Brand, NP  metoprolol succinate (TOPROL XL) 25 MG 24 hr tablet Take 1 tablet (25 mg total) by mouth daily. 06/10/15  Yes Leone Brand, NP    Allergies: No Known Allergies  Social History   Social History  . Marital Status: Single    Spouse Name: N/A  . Number of Children: 0  . Years of Education: 12+   Occupational History  .  [employer]   Social History Main Topics  . Smoking status: Current Every Day Smoker -- 0.50 packs/day for 5 years    Types: Cigarettes  . Smokeless tobacco: Never Used  . Alcohol Use: Yes     Comment: socially  . Drug Use: No  . Sexual Activity: Yes    Birth Control/ Protection: None     Comment: last intercourse Jul 31 2014   Other Topics Concern  . Not on file   Social History Narrative   Regular exercise-no   Caffeine Use-yes     Family History  Problem Relation Age of Onset  . Hypertension Mother   . Hypertension Father   . Hypertension Maternal Grandmother   . Hypertension Maternal Grandfather   . Hypertension Paternal Grandmother   . Hypertension Paternal Grandfather   . Heart attack Mother     received stent in her early 68s  ROS:  Please see the history of present illness.     All other systems reviewed and negative.    Physical Exam:   Blood pressure 130/100, pulse 72, height 5\' 5"  (1.651 m), weight 231 lb 12.8 oz (105.144 kg), not currently breastfeeding. General: Well developed, well nourished female in no acute distress. Head: Normocephalic, atraumatic, sclera non-icteric, no xanthomas, nares are without discharge. EENT: normal  Lymph Nodes:  none Neck: Negative for carotid bruits. JVD not elevated. Back:without scoliosis kyphosis Lungs: Clear bilaterally to auscultation without wheezes, rales, or rhonchi. Breathing is unlabored. Heart: RRR with loud S1 S2. 2/6 systolic   murmur . No rubs, or gallops appreciated. Abdomen: Soft, non-tender, non-distended with normoactive bowel sounds. No hepatomegaly. No rebound/guarding. No obvious abdominal masses. Msk:  Strength and tone appear normal for age. Extremities: No clubbing or cyanosis. No * edema.  Distal pedal pulses are 2+ and equal bilaterally. Skin: Warm and Dry Neuro: Alert and oriented X 3. CN III-XII intact Grossly normal sensory and motor function . Psych:  Responds to questions appropriately with a normal affect.      Labs: Cardiac Enzymes No results for input(s): CKTOTAL, CKMB, TROPONINI in the last 72 hours. CBC Lab Results  Component Value Date   WBC 5.1 10/21/2014   HGB 13.4 10/21/2014   HCT 39.8 10/21/2014   MCV 94.1 10/21/2014   PLT 263 10/21/2014   PROTIME: No results for input(s): LABPROT, INR in the last 72 hours. Chemistry No results for input(s): NA, K, CL, CO2, BUN, CREATININE, CALCIUM, PROT, BILITOT, ALKPHOS, ALT, AST, GLUCOSE in the last 168 hours.  Invalid input(s): LABALBU Lipids Lab Results  Component Value Date   CHOL 156 08/08/2012   HDL 34.90* 08/08/2012   LDLCALC 82 08/08/2012   TRIG 198.0* 08/08/2012   BNP PRO B NATRIURETIC PEPTIDE (BNP)  Date/Time Value Ref Range Status  05/18/2014 12:55 PM 163.3* 0 - 125 pg/mL Final   Thyroid Function Tests: No results for input(s): TSH, T4TOTAL, T3FREE, THYROIDAB in the last 72 hours.  Invalid input(s): FREET3 Miscellaneous No results found for: DDIMER  Radiology/Studies:  No results found.  EKG: sinus with short PR  04/26/36  ECG 10/15  Typical atrial flutter 2:1   Assessment and Plan:  Atrial flutter typical  Hypertension  Cardiomyopathy  Rate related  Anxiety   She has a CHADS-VASc score of 2 both her young age I think it is reasonable not to be on anticoagulation at this point. We have discussed the role of catheter ablation and we'll plan to defer to the new year.  In the interim, we will have her  stop her aspirin. We will continue her on her Ace inhibitors and beta blockers given her now resolved cardiomyopathy but in the context of her hypertension the reasonable drugs to continue.  She smokes. We have discussed the importance of stopping especially as a young mother  She suffers from a great deal of anxiety. I have given her the number for [facility] as a potential source of counseling    Sherryl Manges

## 2015-08-26 ENCOUNTER — Ambulatory Visit: Payer: Self-pay | Admitting: Cardiology

## 2015-08-27 ENCOUNTER — Ambulatory Visit (INDEPENDENT_AMBULATORY_CARE_PROVIDER_SITE_OTHER): Payer: 59

## 2015-08-27 ENCOUNTER — Ambulatory Visit (INDEPENDENT_AMBULATORY_CARE_PROVIDER_SITE_OTHER): Payer: 59 | Admitting: Urgent Care

## 2015-08-27 VITALS — BP 182/120 | HR 85 | Temp 98.5°F | Resp 18 | Ht 66.0 in | Wt 218.6 lb

## 2015-08-27 DIAGNOSIS — R0989 Other specified symptoms and signs involving the circulatory and respiratory systems: Secondary | ICD-10-CM

## 2015-08-27 DIAGNOSIS — R059 Cough, unspecified: Secondary | ICD-10-CM

## 2015-08-27 DIAGNOSIS — H02849 Edema of unspecified eye, unspecified eyelid: Secondary | ICD-10-CM | POA: Diagnosis not present

## 2015-08-27 DIAGNOSIS — R062 Wheezing: Secondary | ICD-10-CM | POA: Diagnosis not present

## 2015-08-27 DIAGNOSIS — T783XXA Angioneurotic edema, initial encounter: Secondary | ICD-10-CM

## 2015-08-27 DIAGNOSIS — R05 Cough: Secondary | ICD-10-CM | POA: Diagnosis not present

## 2015-08-27 DIAGNOSIS — F17200 Nicotine dependence, unspecified, uncomplicated: Secondary | ICD-10-CM | POA: Diagnosis not present

## 2015-08-27 DIAGNOSIS — R030 Elevated blood-pressure reading, without diagnosis of hypertension: Secondary | ICD-10-CM | POA: Diagnosis not present

## 2015-08-27 DIAGNOSIS — I10 Essential (primary) hypertension: Secondary | ICD-10-CM | POA: Diagnosis not present

## 2015-08-27 DIAGNOSIS — K130 Diseases of lips: Secondary | ICD-10-CM | POA: Diagnosis not present

## 2015-08-27 DIAGNOSIS — R220 Localized swelling, mass and lump, head: Secondary | ICD-10-CM

## 2015-08-27 LAB — POCT URINALYSIS DIP (MANUAL ENTRY)
BILIRUBIN UA: NEGATIVE
GLUCOSE UA: NEGATIVE
Ketones, POC UA: NEGATIVE
Leukocytes, UA: NEGATIVE
Nitrite, UA: NEGATIVE
RBC UA: NEGATIVE
SPEC GRAV UA: 1.02
Urobilinogen, UA: 1
pH, UA: 6.5

## 2015-08-27 LAB — COMPLETE METABOLIC PANEL WITH GFR
ALBUMIN: 4.4 g/dL (ref 3.6–5.1)
ALK PHOS: 54 U/L (ref 33–115)
ALT: 12 U/L (ref 6–29)
AST: 14 U/L (ref 10–30)
BILIRUBIN TOTAL: 0.9 mg/dL (ref 0.2–1.2)
BUN: 9 mg/dL (ref 7–25)
CO2: 26 mmol/L (ref 20–31)
Calcium: 9.7 mg/dL (ref 8.6–10.2)
Chloride: 103 mmol/L (ref 98–110)
Creat: 0.78 mg/dL (ref 0.50–1.10)
GFR, Est African American: >89 mL/min (ref >=60)
GFR, Est Non African American: >89 mL/min (ref >=60)
GLUCOSE: 86 mg/dL (ref 65–99)
Potassium: 4.2 mmol/L (ref 3.5–5.3)
SODIUM: 138 mmol/L (ref 135–146)
TOTAL PROTEIN: 7.4 g/dL (ref 6.1–8.1)

## 2015-08-27 MED ORDER — PREDNISONE 20 MG PO TABS: ORAL_TABLET | ORAL | Status: DC

## 2015-08-27 MED ORDER — AMLODIPINE BESYLATE 5 MG PO TABS: 5.0000 mg | ORAL_TABLET | Freq: Every day | ORAL | Status: DC

## 2015-08-27 MED ORDER — HYDROCODONE-HOMATROPINE 5-1.5 MG/5ML PO SYRP: 5.0000 mL | ORAL_SOLUTION | Freq: Every evening | ORAL | Status: DC | PRN

## 2015-08-27 MED ORDER — AZITHROMYCIN 250 MG PO TABS: ORAL_TABLET | ORAL | Status: DC

## 2015-08-27 MED ORDER — HYDROCHLOROTHIAZIDE 25 MG PO TABS: 25.0000 mg | ORAL_TABLET | Freq: Every day | ORAL | Status: DC

## 2015-08-27 MED ORDER — ALBUTEROL SULFATE HFA 108 (90 BASE) MCG/ACT IN AERS
2.0000 | INHALATION_SPRAY | Freq: Four times a day (QID) | RESPIRATORY_TRACT | Status: DC | PRN
Start: 2015-08-27 — End: 2019-12-07

## 2015-08-27 NOTE — Progress Notes (Signed)
    MRN: [id number] DOB: [birth year]  Subjective:   Tracy Grant is a 30 y.o. female with pmh of HTN, atrial fibrillation, cardiomyopathy, LVH presenting for chief complaint of Cough and Lips and Eyes swollen  Reports 3 week history of chest congestion, cough, scratchy and itchy throat. In the past 2 days, patient developed facial swelling of her lips and eyelids. Has tried generic Robitussin, benadryl with relief of her swelling. Denies fever, chest pain, shob, n/v, abdominal pain, eye pain, ear pain, sore throat, rashes. Patient is on lisinopril for management of HTN diagnoses 04/2014. She has since been followed up by [facility], Dr. Mayford Knife, Dr. Graciela Husbands for atrial fibrillation. Has follow up scheduled with Cardiology on 08/31/2015. Her last visit was 06/23/2015. She smokes 5-6 cigarettes per day. Patient is "slowing down" on her smoking. Patient admits unhealthy diet and is trying to start losing weight again.  Shaunta has a current medication list which includes the following prescription(s): lisinopril and metoprolol succinate. Also has No Known Allergies.  Tracy Grant  has a past medical history of migraines; Hyperlipidemia; Allergy; History of chicken pox; Headache(784.0); Infection; Cardiomyopathy (HCC); Hypertension; and Bipolar disorder (HCC) (2014). Also  has past surgical history that includes Adenoidectomy; Dilation and curettage of uterus; and Cesarean section (N/A, 01/17/2014).  Objective:   Vitals: BP 182/120 mmHg  Pulse 85  Temp(Src) 98.5 F (36.9 C) (Oral)  Resp 18  Ht 5\' 6"  (1.676 m)  Wt 218 lb 9.6 oz (99.156 kg)  BMI 35.30 kg/m2  SpO2 96%  LMP 08/18/2015  Breastfeeding? No  2nd BP reading with electronic cuff was 161/117 at 11:46.  Physical Exam  Constitutional: She is oriented to person, place, and time. She appears well-developed and well-nourished.  HENT:  Mouth/Throat: Oropharynx is clear and moist.  Mild eyelid swelling bilaterally. Airway is  patent.  Eyes: Right eye exhibits no discharge. Left eye exhibits no discharge. No scleral icterus.  Neck: Normal range of motion. Neck supple.  Cardiovascular: Normal rate, regular rhythm and intact distal pulses.  Exam reveals no gallop and no friction rub.   No murmur heard. Pulmonary/Chest: No respiratory distress. She has wheezes (throughout). She has no rales.  Abdominal: Soft. Bowel sounds are normal. She exhibits no distension and no mass. There is no tenderness.  Lymphadenopathy:    She has no cervical adenopathy.  Neurological: She is alert and oriented to person, place, and time.  Skin: Skin is warm and dry. No rash noted. No erythema. No pallor.   [facility] reading (PRIMARY) by Dr. [name] and PA-Audi Conover. Chest - poor inspiratory effort, negative for acute process.  Assessment and Plan :   1. Angioedema, initial encounter 2. Essential hypertension 3. Elevated blood pressure reading 4. Swelling of eyelid, unspecified laterality 5. Lip swelling - Stop lisinopril. Start HCTZ, amlodipine. Recheck with cardiologist as scheduled. For angioedema, patient will start prednisone taper, antihistamines. Recheck with me in 4 weeks for BP management.  6. Cough 7. Chest congestion 8. Wheezing - Will cover for infectious process due to her smoking and 3 week history of cough. However, her cough may be due to adverse effect of lisinopril as above.  9. Tobacco use disorder - Counseled on smoking cessation. Patient is not yet ready to quit smoking.  Wallis Bamberg, PA-C [facility] [phone] 08/27/2015 11:38 AM

## 2015-08-27 NOTE — Patient Instructions (Addendum)
- Start taking hydrochlorothiazide (HCTZ) once daily. - Check your blood pressure 2-3 times per week around the same time each day and record these readings in a notebook. - In 2 weeks, if your blood pressure is less than 120/80, do not start amlodipine. Otherwise, please start this blood pressure medication in addition to HCTZ.   - For angioedema, STOP LISINOPRIL. Start taking Zyrtec once daily and Zantac twice daily. You will also be taking a short course of steroid to help with this.  - For the prednisone, start with 2 tablets daily with food for Days 1-3, then switch to 1 tablet for Days 4-6.    Angioedema Angioedema is a sudden swelling of tissues, often of the skin. It can occur on the face or genitals or in the abdomen or other body parts. The swelling usually develops over a short period and gets better in 24 to 48 hours. It often begins during the night and is found when the person wakes up. The person may also get red, itchy patches of skin (hives). Angioedema can be dangerous if it involves swelling of the air passages.  Depending on the cause, episodes of angioedema may only happen once, come back in unpredictable patterns, or repeat for several years and then gradually fade away.  CAUSES  Angioedema can be caused by an allergic reaction to various triggers. It can also result from nonallergic causes, including reactions to drugs, immune system disorders, viral infections, or an abnormal gene that is passed to you from your parents (hereditary). For some people with angioedema, the cause is unknown.  Some things that can trigger angioedema include:   Foods.   Medicines, such as ACE inhibitors, ARBs, nonsteroidal anti-inflammatory agents, or estrogen.   Latex.   Animal saliva.   Insect stings.   Dyes used in X-rays.   Mild injury.   Dental work.  Surgery.  Stress.   Sudden changes in temperature.   Exercise. SIGNS AND SYMPTOMS   Swelling of the  skin.  Hives. If these are present, there is also intense itching.  Redness in the affected area.   Pain in the affected area.  Swollen lips or tongue.  Breathing problems. This may happen if the air passages swell.  Wheezing. If internal organs are involved, there may be:   Nausea.   Abdominal pain.   Vomiting.   Difficulty swallowing.   Difficulty passing urine. DIAGNOSIS   Your health care provider will examine the affected area and take a medical and family history.  Various tests may be done to help determine the cause. Tests may include:  Allergy skin tests to see if the problem is an allergic reaction.   Blood tests to check for hereditary angioedema.   Tests to check for underlying diseases that could cause the condition.   A review of your medicines, including over-the-counter medicines, may be done. TREATMENT  Treatment will depend on the cause of the angioedema. Possible treatments include:   Removal of anything that triggered the condition (such as stopping certain medicines).   Medicines to treat symptoms or prevent attacks. Medicines given may include:   Antihistamines.   Epinephrine injection.   Steroids.   Hospitalization may be required for severe attacks. If the air passages are affected, it can be an emergency. Tubes may need to be placed to keep the airway open. HOME CARE INSTRUCTIONS   Take all medicines as directed by your health care provider.  If you were given medicines for emergency allergy  treatment, always carry them with you.  Wear a medical bracelet as directed by your health care provider.   Avoid known triggers. SEEK MEDICAL CARE IF:   You have repeat attacks of angioedema.   Your attacks are more frequent or more severe despite preventive measures.   You have hereditary angioedema and are considering having children. It is important to discuss with your health care provider the risks of passing the  condition on to your children. SEEK IMMEDIATE MEDICAL CARE IF:   You have severe swelling of the mouth, tongue, or lips.  You have difficulty breathing.   You have difficulty swallowing.   You faint. MAKE SURE YOU:  Understand these instructions.  Will watch your condition.  Will get help right away if you are not doing well or get worse.   This information is not intended to replace advice given to you by your health care provider. Make sure you discuss any questions you have with your health care provider.   Document Released: 10/11/2001 Document Revised: 08/23/2014 Document Reviewed: 03/26/2013 Elsevier Interactive Patient Education 2016 [employer].    Hypertension Hypertension, commonly called high blood pressure, is when the force of blood pumping through your arteries is too strong. Your arteries are the blood vessels that carry blood from your heart throughout your body. A blood pressure reading consists of a higher number over a lower number, such as 110/72. The higher number (systolic) is the pressure inside your arteries when your heart pumps. The lower number (diastolic) is the pressure inside your arteries when your heart relaxes. Ideally you want your blood pressure below 120/80. Hypertension forces your heart to work harder to pump blood. Your arteries may become narrow or stiff. Having untreated or uncontrolled hypertension can cause heart attack, stroke, kidney disease, and other problems. RISK FACTORS Some risk factors for high blood pressure are controllable. Others are not.  Risk factors you cannot control include:   Race. You may be at higher risk if you are African American.  Age. Risk increases with age.  Gender. Men are at higher risk than women before age 67 years. After age 4, women are at higher risk than men. Risk factors you can control include:  Not getting enough exercise or physical activity.  Being overweight.  Getting too much fat,  sugar, calories, or salt in your diet.  Drinking too much alcohol. SIGNS AND SYMPTOMS Hypertension does not usually cause signs or symptoms. Extremely high blood pressure (hypertensive crisis) may cause headache, anxiety, shortness of breath, and nosebleed. DIAGNOSIS To check if you have hypertension, your health care provider will measure your blood pressure while you are seated, with your arm held at the level of your heart. It should be measured at least twice using the same arm. Certain conditions can cause a difference in blood pressure between your right and left arms. A blood pressure reading that is higher than normal on one occasion does not mean that you need treatment. If it is not clear whether you have high blood pressure, you may be asked to return on a different day to have your blood pressure checked again. Or, you may be asked to monitor your blood pressure at home for 1 or more weeks. TREATMENT Treating high blood pressure includes making lifestyle changes and possibly taking medicine. Living a healthy lifestyle can help lower high blood pressure. You may need to change some of your habits. Lifestyle changes may include:  Following the DASH diet. This diet is high  in fruits, vegetables, and whole grains. It is low in salt, red meat, and added sugars.  Keep your sodium intake below 2,300 mg per day.  Getting at least 30-45 minutes of aerobic exercise at least 4 times per week.  Losing weight if necessary.  Not smoking.  Limiting alcoholic beverages.  Learning ways to reduce stress. Your health care provider may prescribe medicine if lifestyle changes are not enough to get your blood pressure under control, and if one of the following is true:  You are 35-110 years of age and your systolic blood pressure is above 140.  You are 76 years of age or older, and your systolic blood pressure is above 150.  Your diastolic blood pressure is above 90.  You have diabetes, and your  systolic blood pressure is over 140 or your diastolic blood pressure is over 90.  You have kidney disease and your blood pressure is above 140/90.  You have heart disease and your blood pressure is above 140/90. Your personal target blood pressure may vary depending on your medical conditions, your age, and other factors. HOME CARE INSTRUCTIONS  Have your blood pressure rechecked as directed by your health care provider.   Take medicines only as directed by your health care provider. Follow the directions carefully. Blood pressure medicines must be taken as prescribed. The medicine does not work as well when you skip doses. Skipping doses also puts you at risk for problems.  Do not smoke.   Monitor your blood pressure at home as directed by your health care provider. SEEK MEDICAL CARE IF:   You think you are having a reaction to medicines taken.  You have recurrent headaches or feel dizzy.  You have swelling in your ankles.  You have trouble with your vision. SEEK IMMEDIATE MEDICAL CARE IF:  You develop a severe headache or confusion.  You have unusual weakness, numbness, or feel faint.  You have severe chest or abdominal pain.  You vomit repeatedly.  You have trouble breathing. MAKE SURE YOU:   Understand these instructions.  Will watch your condition.  Will get help right away if you are not doing well or get worse.   This information is not intended to replace advice given to you by your health care provider. Make sure you discuss any questions you have with your health care provider.   Document Released: 08/02/2005 Document Revised: 12/17/2014 Document Reviewed: 05/25/2013 Elsevier Interactive Patient Education [employer]

## 2015-08-28 ENCOUNTER — Encounter: Payer: Self-pay | Admitting: Urgent Care

## 2015-08-28 LAB — TSH: TSH: 0.759 u[IU]/mL (ref 0.350–4.500)

## 2015-09-10 ENCOUNTER — Ambulatory Visit: Payer: Self-pay | Admitting: Cardiology

## 2015-09-26 ENCOUNTER — Ambulatory Visit: Payer: Self-pay | Admitting: Internal Medicine

## 2015-09-29 ENCOUNTER — Encounter: Payer: Self-pay | Admitting: Internal Medicine

## 2015-10-07 ENCOUNTER — Encounter: Payer: Self-pay | Admitting: Cardiology

## 2015-10-07 NOTE — Progress Notes (Signed)
This encounter was created in error - please disregard.

## 2015-10-08 ENCOUNTER — Encounter: Payer: Self-pay | Admitting: Cardiology

## 2016-01-04 ENCOUNTER — Emergency Department (HOSPITAL_BASED_OUTPATIENT_CLINIC_OR_DEPARTMENT_OTHER)
Admission: EM | Admit: 2016-01-04 | Discharge: 2016-01-05 | Disposition: A | Discharge status: Home / Self Care | Payer: Self-pay | Attending: Emergency Medicine | Admitting: Emergency Medicine

## 2016-01-04 ENCOUNTER — Encounter (HOSPITAL_BASED_OUTPATIENT_CLINIC_OR_DEPARTMENT_OTHER): Payer: Self-pay

## 2016-01-04 DIAGNOSIS — G43409 Hemiplegic migraine, not intractable, without status migrainosus: Secondary | ICD-10-CM | POA: Insufficient documentation

## 2016-01-04 DIAGNOSIS — I10 Essential (primary) hypertension: Secondary | ICD-10-CM | POA: Insufficient documentation

## 2016-01-04 DIAGNOSIS — E785 Hyperlipidemia, unspecified: Secondary | ICD-10-CM | POA: Insufficient documentation

## 2016-01-04 DIAGNOSIS — F319 Bipolar disorder, unspecified: Secondary | ICD-10-CM | POA: Insufficient documentation

## 2016-01-04 DIAGNOSIS — F17210 Nicotine dependence, cigarettes, uncomplicated: Secondary | ICD-10-CM | POA: Insufficient documentation

## 2016-01-04 NOTE — ED Notes (Signed)
Pt reports headache off and on x "a couple of months." Reports some nausea with it.

## 2016-01-05 MED ORDER — NAPROXEN 250 MG PO TABS: 500.0000 mg | ORAL_TABLET | Freq: Once | ORAL | Status: DC

## 2016-01-05 NOTE — ED Provider Notes (Signed)
CSN: [id number]     Arrival date & time 01/04/16  2147 History  By signing my name below, I, Tracy Grant, attest that this documentation has been prepared under the direction and in the presence of Tracy Libra, MD. Electronically Signed: Ronney Grant, ED Scribe. 01/05/2016. 12:56 AM.   Chief Complaint  Patient presents with  . Headache   The history is provided by the patient. No language interpreter was used.    HPI Comments: Tracy Grant is a 30 y.o. female with a history of migraines who presents to the Emergency Department complaining of a right-sided headache that began when she woke up yesterday morning. She characterizes her pain as feeling like "my head is squeezing and throbbing" in quality. She complains of associated nausea and one episode of vomiting. She states her headache has improved after taking Excedrin and it is mild now. Patient notes a history of migraines since childhood. She states she had migraines at a frequency of 3 times a week when she was a child, but her headaches decreased in frequency as she aged. However, her migraines in the past few months have again increased to a frequency of 3 times a week.   Past Medical History  Diagnosis Date  . Hx of migraines   . Hyperlipidemia   . Allergy   . History of chicken pox   . Headache(784.0)   . Infection     trich  . Cardiomyopathy (HCC)     a. Echo (10/15):  EF 35-40%, mild MR, mild to moderately reduced RV systolic function, PASP 31 mmHg  ; b.  Echo (12/15):  Posterior basal HK, mild LVH, EF 55%, mild LAE.  Marland Kitchen Hypertension   . Bipolar disorder (HCC) 2014   Past Surgical History  Procedure Laterality Date  . Adenoidectomy    . Dilation and curettage of uterus    . Cesarean section N/A 01/17/2014    Procedure: CESAREAN SECTION;  Surgeon: Brock Bad, MD;  Location: [facility];  Service: Obstetrics;  Laterality: N/A;   Family History  Problem Relation Age of Onset  . Hypertension Mother   . Heart attack Mother      received stent in her early 109s  . Hypertension Father   . Hypertension Maternal Grandmother   . Hypertension Maternal Grandfather   . Hypertension Paternal Grandmother   . Hypertension Paternal Grandfather   . Hypertension Brother    Social History  Substance Use Topics  . Smoking status: Current Every Day Smoker -- 0.50 packs/day for 5 years    Types: Cigarettes  . Smokeless tobacco: Never Used  . Alcohol Use: Yes     Comment: socially   OB History    Gravida Para Term Preterm AB TAB SAB Ectopic Multiple Living   3 1 1  0 2 0 2 0 0 1     Review of Systems A complete 10 system review of systems was obtained and all systems are negative except as noted in the HPI and PMH.    Allergies  Lisinopril  Home Medications   Prior to Admission medications   Medication Sig Start Date End Date Taking? Authorizing Provider  albuterol (PROVENTIL HFA;VENTOLIN HFA) 108 (90 Base) MCG/ACT inhaler Inhale 2 puffs into the lungs every 6 (six) hours as needed for wheezing or shortness of breath (cough, shortness of breath or wheezing.). 08/27/15   Wallis Bamberg, PA-C  amLODipine (NORVASC) 5 MG tablet Take 1 tablet (5 mg total) by mouth daily. 08/27/15  Wallis Bamberg, PA-C  azithromycin (ZITHROMAX) 250 MG tablet Start with 2 tablets today, then 1 daily thereafter. 08/27/15   Wallis Bamberg, PA-C  hydrochlorothiazide (HYDRODIURIL) 25 MG tablet Take 1 tablet (25 mg total) by mouth daily. 08/27/15   Wallis Bamberg, PA-C  HYDROcodone-homatropine [facility]) 5-1.5 MG/5ML syrup Take 5 mLs by mouth at bedtime as needed. 08/27/15   Wallis Bamberg, PA-C  lisinopril (PRINIVIL,ZESTRIL) 20 MG tablet Take 1 tablet (20 mg total) by mouth daily. 06/10/15   Leone Brand, NP  predniSONE (DELTASONE) 20 MG tablet Take 2 tablets daily with breakfast. 08/27/15   Wallis Bamberg, PA-C   BP 157/115 mmHg  Pulse 68  Temp(Src) 98.4 F (36.9 C) (Oral)  Resp 18  Ht  (1.676 m)  Wt 215 lb (97.523 kg)  BMI 34.72 kg/m2  SpO2 100%   Physical  Exam  Nursing note and vitals reviewed. General: Well-developed, well-nourished female in no acute distress; appearance consistent with age of record HENT: normocephalic; atraumatic Eyes: pupils equal, round and reactive to light; extraocular muscles intact Neck: supple Heart: regular rate and rhythm Lungs: clear to auscultation bilaterally Abdomen: soft; nondistended; nontender; bowel sounds present Extremities: No deformity; full range of motion; pulses normal Neurologic: Awake, alert and oriented; motor function intact in all extremities and symmetric; no facial droop; normal coordination and speech Skin: Warm and dry Psychiatric: Normal mood and affect    ED Course  Procedures (including critical care time)   MDM   Final diagnoses:  Sporadic migraine    I personally performed the services described in this documentation, which was scribed in my presence. The recorded information has been reviewed and is accurate.     Tracy Libra, MD 01/05/16 [phone]

## 2016-01-05 NOTE — Discharge Instructions (Signed)

## 2016-02-23 ENCOUNTER — Ambulatory Visit (HOSPITAL_COMMUNITY)
Admission: EM | Admit: 2016-02-23 | Discharge: 2016-02-23 | Disposition: A | Discharge status: Home / Self Care | Payer: 59 | Attending: Emergency Medicine | Admitting: Emergency Medicine

## 2016-02-23 ENCOUNTER — Encounter (HOSPITAL_COMMUNITY): Payer: Self-pay | Admitting: Emergency Medicine

## 2016-02-23 DIAGNOSIS — J029 Acute pharyngitis, unspecified: Secondary | ICD-10-CM | POA: Diagnosis not present

## 2016-02-23 DIAGNOSIS — J302 Other seasonal allergic rhinitis: Secondary | ICD-10-CM | POA: Insufficient documentation

## 2016-02-23 DIAGNOSIS — J3489 Other specified disorders of nose and nasal sinuses: Secondary | ICD-10-CM | POA: Diagnosis not present

## 2016-02-23 LAB — POCT RAPID STREP A: STREPTOCOCCUS, GROUP A SCREEN (DIRECT): NEGATIVE

## 2016-02-23 MED ORDER — IBUPROFEN 800 MG PO TABS
800.0000 mg | ORAL_TABLET | Freq: Once | ORAL | Status: AC
  Administered 2016-02-23: 800 mg via ORAL

## 2016-02-23 MED ORDER — LORATADINE 10 MG PO TABS: 10.0000 mg | ORAL_TABLET | Freq: Every day | ORAL | Status: DC

## 2016-02-23 MED ORDER — IBUPROFEN 800 MG PO TABS
ORAL_TABLET | ORAL | Status: AC
  Filled 2016-02-23: qty 1

## 2016-02-23 NOTE — Discharge Instructions (Signed)
Allergic Rhinitis For nasal and head congestion may take Sudafed PE 10 mg every 4 hours as needed. Saline nasal spray used frequently. For drainage may use Allegra, Claritin or Zyrtec. If you need stronger medicine to stop drainage may take Chlor-Trimeton 2-4 mg every 4 hours. This may cause drowsiness. Ibuprofen 600 mg every 6 hours as needed for pain, discomfort or fever. Drink plenty of fluids and stay well-hydrated. Cepacol lozenges for sore throat discomfort. Allergic rhinitis is when the mucous membranes in the nose respond to allergens. Allergens are particles in the air that cause your body to have an allergic reaction. This causes you to release allergic antibodies. Through a chain of events, these eventually cause you to release histamine into the blood stream. Although meant to protect the body, it is this release of histamine that causes your discomfort, such as frequent sneezing, congestion, and an itchy, runny nose.  CAUSES Seasonal allergic rhinitis (hay fever) is caused by pollen allergens that may come from grasses, trees, and weeds. Year-round allergic rhinitis (perennial allergic rhinitis) is caused by allergens such as house dust mites, pet dander, and mold spores. SYMPTOMS  Nasal stuffiness (congestion).  Itchy, runny nose with sneezing and tearing of the eyes. DIAGNOSIS Your health care provider can help you determine the allergen or allergens that trigger your symptoms. If you and your health care provider are unable to determine the allergen, skin or blood testing may be used. Your health care provider will diagnose your condition after taking your health history and performing a physical exam. Your health care provider may assess you for other related conditions, such as asthma, pink eye, or an ear infection. TREATMENT Allergic rhinitis does not have a cure, but it can be controlled by:  Medicines that block allergy symptoms. These may include allergy shots, nasal sprays,  and oral antihistamines.  Avoiding the allergen. Hay fever may often be treated with antihistamines in pill or nasal spray forms. Antihistamines block the effects of histamine. There are over-the-counter medicines that may help with nasal congestion and swelling around the eyes. Check with your health care provider before taking or giving this medicine. If avoiding the allergen or the medicine prescribed do not work, there are many new medicines your health care provider can prescribe. Stronger medicine may be used if initial measures are ineffective. Desensitizing injections can be used if medicine and avoidance does not work. Desensitization is when a patient is given ongoing shots until the body becomes less sensitive to the allergen. Make sure you follow up with your health care provider if problems continue. HOME CARE INSTRUCTIONS It is not possible to completely avoid allergens, but you can reduce your symptoms by taking steps to limit your exposure to them. It helps to know exactly what you are allergic to so that you can avoid your specific triggers. SEEK MEDICAL CARE IF:  You have a fever.  You develop a cough that does not stop easily (persistent).  You have shortness of breath.  You start wheezing.  Symptoms interfere with normal daily activities.   This information is not intended to replace advice given to you by your health care provider. Make sure you discuss any questions you have with your health care provider.   Document Released: 04/27/2001 Document Revised: 08/23/2014 Document Reviewed: 04/09/2013 Elsevier Interactive Patient Education 2016 [employer]  Sore Throat A sore throat is a painful, burning, sore, or scratchy feeling of the throat. There may be pain or tenderness when swallowing or talking. You  may have other symptoms with a sore throat. These include coughing, sneezing, fever, or a swollen neck. A sore throat is often the first sign of another sickness.  These sicknesses may include a cold, flu, strep throat, or an infection called mono. Most sore throats go away without medical treatment.  HOME CARE   Only take medicine as told by your doctor.  Drink enough fluids to keep your pee (urine) clear or pale yellow.  Rest as needed.  Try using throat sprays, lozenges, or suck on hard candy (if older than 4 years or as told).  Sip warm liquids, such as broth, herbal tea, or warm water with honey. Try sucking on frozen ice pops or drinking cold liquids.  Rinse the mouth (gargle) with salt water. Mix 1 teaspoon salt with 8 ounces of water.  Do not smoke. Avoid being around others when they are smoking.  Put a humidifier in your bedroom at night to moisten the air. You can also turn on a hot shower and sit in the bathroom for 5-10 minutes. Be sure the bathroom door is closed. GET HELP RIGHT AWAY IF:   You have trouble breathing.  You cannot swallow fluids, soft foods, or your spit (saliva).  You have more puffiness (swelling) in the throat.  Your sore throat does not get better in 7 days.  You feel sick to your stomach (nauseous) and throw up (vomit).  You have a fever or lasting symptoms for more than 2-3 days.  You have a fever and your symptoms suddenly get worse. MAKE SURE YOU:   Understand these instructions.  Will watch your condition.  Will get help right away if you are not doing well or get worse.   This information is not intended to replace advice given to you by your health care provider. Make sure you discuss any questions you have with your health care provider.   Document Released: 05/11/2008 Document Revised: 04/26/2012 Document Reviewed: 04/09/2012 Elsevier Interactive Patient Education [employer]

## 2016-02-23 NOTE — ED Notes (Signed)
The patient presented to the [facility] with a complaint of a sore throat with mild fever x3 days.

## 2016-02-23 NOTE — ED Provider Notes (Signed)
CSN: [id number]     Arrival date & time 02/23/16  1017 History   First MD Initiated Contact with Patient 02/23/16 1029     Chief Complaint  Patient presents with  . Sore Throat   (Consider location/radiation/quality/duration/timing/severity/associated sxs/prior Treatment) HPI Comments: 30 year old female complaining of a sore throat for 2 days. She states that associated PND with no cough, chest pain or shortness of breath. She does have some equivocal pain in the years. Denies fever or chills.  Patient is a 30 y.o. female presenting with pharyngitis.  Sore Throat Pertinent negatives include no shortness of breath.    Past Medical History  Diagnosis Date  . Hx of migraines   . Hyperlipidemia   . Allergy   . History of chicken pox   . Headache(784.0)   . Infection     trich  . Cardiomyopathy (HCC)     a. Echo (10/15):  EF 35-40%, mild MR, mild to moderately reduced RV systolic function, PASP 31 mmHg  ; b.  Echo (12/15):  Posterior basal HK, mild LVH, EF 55%, mild LAE.  Marland Kitchen Hypertension   . Bipolar disorder (HCC) 2014   Past Surgical History  Procedure Laterality Date  . Adenoidectomy    . Dilation and curettage of uterus    . Cesarean section N/A 01/17/2014    Procedure: CESAREAN SECTION;  Surgeon: Brock Bad, MD;  Location: [facility];  Service: Obstetrics;  Laterality: N/A;   Family History  Problem Relation Age of Onset  . Hypertension Mother   . Heart attack Mother     received stent in her early 26s  . Hypertension Father   . Hypertension Maternal Grandmother   . Hypertension Maternal Grandfather   . Hypertension Paternal Grandmother   . Hypertension Paternal Grandfather   . Hypertension Brother    Social History  Substance Use Topics  . Smoking status: Current Every Day Smoker -- 0.50 packs/day for 5 years    Types: Cigarettes  . Smokeless tobacco: Never Used  . Alcohol Use: Yes     Comment: socially   OB History    Gravida Para Term Preterm AB TAB SAB  Ectopic Multiple Living   0 2 0 2 0 0 1     Review of Systems  Constitutional: Negative for fever, chills, activity change, appetite change and fatigue.  HENT: Positive for congestion, ear pain, postnasal drip, rhinorrhea and sore throat. Negative for facial swelling.   Eyes: Negative.   Respiratory: Negative.  Negative for cough and shortness of breath.   Cardiovascular: Negative.   Musculoskeletal: Negative for neck pain and neck stiffness.  Skin: Negative for pallor and rash.  Neurological: Negative.     Allergies  Lisinopril  Home Medications   Prior to Admission medications   Medication Sig Start Date End Date Taking? Authorizing Provider  amLODipine (NORVASC) 5 MG tablet Take 1 tablet (5 mg total) by mouth daily. 08/27/15  Yes Wallis Bamberg, PA-C  albuterol (PROVENTIL HFA;VENTOLIN HFA) 108 (90 Base) MCG/ACT inhaler Inhale 2 puffs into the lungs every 6 (six) hours as needed for wheezing or shortness of breath (cough, shortness of breath or wheezing.). 08/27/15   Wallis Bamberg, PA-C  azithromycin (ZITHROMAX) 250 MG tablet Start with 2 tablets today, then 1 daily thereafter. 08/27/15   Wallis Bamberg, PA-C  hydrochlorothiazide (HYDRODIURIL) 25 MG tablet Take 1 tablet (25 mg total) by mouth daily. 08/27/15   Wallis Bamberg, PA-C  HYDROcodone-homatropine [facility]) 5-1.5 MG/5ML syrup Take 5  mLs by mouth at bedtime as needed. 08/27/15   Wallis Bamberg, PA-C  lisinopril (PRINIVIL,ZESTRIL) 20 MG tablet Take 1 tablet (20 mg total) by mouth daily. 06/10/15   Leone Brand, NP  predniSONE (DELTASONE) 20 MG tablet Take 2 tablets daily with breakfast. 08/27/15   Wallis Bamberg, PA-C   Meds Ordered and Administered this Visit  Medications - No data to display  BP 162/126 mmHg  Pulse 86  Temp(Src) 99.8 F (37.7 C) (Temporal)  Resp 20  SpO2 99% No data found.   Physical Exam  Constitutional: She is oriented to person, place, and time. She appears well-developed and well-nourished. No distress.  HENT:   Mouth/Throat: No oropharyngeal exudate.  Bilateral TMs are retracted. No erythema or effusion. EACs are clear.  Oropharynx with minor erythema and cobblestoning. Scant clear PND. Patient is noticed clearing throat during the exam.    Eyes: Conjunctivae and EOM are normal.  Neck: Normal range of motion. Neck supple.  Cardiovascular: Normal rate, regular rhythm and normal heart sounds.   Pulmonary/Chest: Effort normal and breath sounds normal. No respiratory distress. She has no wheezes. She has no rales.  Musculoskeletal: Normal range of motion. She exhibits no edema.  Lymphadenopathy:    She has no cervical adenopathy.  Neurological: She is alert and oriented to person, place, and time.  Skin: Skin is warm and dry. No rash noted.  Psychiatric: She has a normal mood and affect.  Nursing note and vitals reviewed.   ED Course  Procedures (including critical care time)  Labs Review Labs Reviewed  POCT RAPID STREP A   Results for orders placed or performed during the hospital encounter of 02/23/16  POCT rapid strep A [facility])  Result Value Ref Range   Streptococcus, Group A Screen (Direct) NEGATIVE NEGATIVE     Imaging Review No results found.   Visual Acuity Review  Right Eye Distance:   Left Eye Distance:   Bilateral Distance:    Right Eye Near:   Left Eye Near:    Bilateral Near:         MDM   1. Other seasonal allergic rhinitis   2. Allergic pharyngitis   3. Sinus drainage    Meds ordered this encounter  Medications  . ibuprofen (ADVIL,MOTRIN) tablet 800 mg    Sig:     Allergic Rhinitis Saline nasal spray used frequently. For drainage may use Allegra, Claritin or Zyrtec. If you need stronger medicine to stop drainage may take Chlor-Trimeton 2-4 mg every 4 hours. This may cause drowsiness. Ibuprofen 600 mg every 6 hours as needed for pain, discomfort or fever. Drink plenty of fluids and stay well-hydrated. Cepacol lozenges for sore throat  discomfort. Rx for Claritin Po Ibuprofen 800 mg po adm.   Hayden Rasmussen, NP 02/23/16 1100  Hayden Rasmussen, NP 02/23/16 1136

## 2016-02-26 LAB — CULTURE, GROUP A STREP (THRC)

## 2016-03-28 ENCOUNTER — Emergency Department (HOSPITAL_COMMUNITY): Payer: 59

## 2016-03-28 ENCOUNTER — Emergency Department (HOSPITAL_COMMUNITY)
Admission: EM | Admit: 2016-03-28 | Discharge: 2016-03-29 | Disposition: A | Discharge status: Home / Self Care | Payer: 59 | Attending: Emergency Medicine | Admitting: Emergency Medicine

## 2016-03-28 ENCOUNTER — Encounter (HOSPITAL_COMMUNITY): Payer: Self-pay | Admitting: *Deleted

## 2016-03-28 DIAGNOSIS — Z79899 Other long term (current) drug therapy: Secondary | ICD-10-CM | POA: Diagnosis not present

## 2016-03-28 DIAGNOSIS — R0602 Shortness of breath: Secondary | ICD-10-CM | POA: Diagnosis not present

## 2016-03-28 DIAGNOSIS — B349 Viral infection, unspecified: Secondary | ICD-10-CM | POA: Insufficient documentation

## 2016-03-28 DIAGNOSIS — F17210 Nicotine dependence, cigarettes, uncomplicated: Secondary | ICD-10-CM | POA: Diagnosis not present

## 2016-03-28 DIAGNOSIS — I10 Essential (primary) hypertension: Secondary | ICD-10-CM | POA: Diagnosis not present

## 2016-03-28 DIAGNOSIS — R05 Cough: Secondary | ICD-10-CM | POA: Diagnosis not present

## 2016-03-28 DIAGNOSIS — R059 Cough, unspecified: Secondary | ICD-10-CM

## 2016-03-28 DIAGNOSIS — M791 Myalgia: Secondary | ICD-10-CM | POA: Diagnosis present

## 2016-03-28 MED ORDER — BENZONATATE 100 MG PO CAPS
100.0000 mg | ORAL_CAPSULE | Freq: Once | ORAL | Status: AC
  Administered 2016-03-28: 100 mg via ORAL
  Filled 2016-03-28: qty 1

## 2016-03-28 NOTE — ED Triage Notes (Signed)
The pt has had a cold and a cough since Friday c/o chest congestion and tightness prod cough white aching all over her body  lmp birth cointrol

## 2016-03-28 NOTE — ED Provider Notes (Signed)
[facility]-EMERGENCY DEPT Provider Note   CSN: [id number] Arrival date & time: 03/28/16  2024  First Provider Contact:  First MD Initiated Contact with Patient 03/28/16 2201        History   Chief Complaint Chief Complaint  Patient presents with  . Generalized Body Aches    HPI Tracy Grant is a 30 y.o. female.  HPI   30 year old female with history of cardiomyopathy, hypertension, hyperlipidemia presenting today with complaint of cold symptoms. Patient report for the past 3 days she has had subjective fever, chills, chest tightness, shortness of breath, cough productive with clear sputum, runny nose, feeling weak and dizzy along with body aches. Symptom has been persistent and not adequately improved despite using DayQuil and Claritin at home. She is a smoker. She denies any recent sick contact. She denies nausea vomiting diarrhea, abdominal pain, back pain, dysuria, hematuria, or rash. Remote history of cardiomyopathy likely secondary to pregnancy. She is currently not pregnant.  Past Medical History:  Diagnosis Date  . Allergy   . Bipolar disorder (HCC) 2014  . Cardiomyopathy (HCC)    a. Echo (10/15):  EF 35-40%, mild MR, mild to moderately reduced RV systolic function, PASP 31 mmHg  ; b.  Echo (12/15):  Posterior basal HK, mild LVH, EF 55%, mild LAE.  Marland Kitchen Headache(784.0)   . History of chicken pox   . Hx of migraines   . Hyperlipidemia   . Hypertension   . Infection    trich    Patient Active Problem List   Diagnosis Date Noted  . Contraception management 10/21/2014  . Morbid obesity (HCC) 10/21/2014  . 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 Benign essential HTN 07/25/2014  . LVH (left ventricular hypertrophy) 07/25/2014  . Atrial flutter (HCC) 05/16/2014  . Abnormal chest x-ray 05/16/2014  . Breech presentation delivered 01/17/2014  . Migraine 08/08/2012  . Oligo-ovulation 03/19/2011    Past Surgical History:  Procedure Laterality  Date  . ADENOIDECTOMY    . CESAREAN SECTION N/A 01/17/2014   Procedure: CESAREAN SECTION;  Surgeon: Brock Bad, MD;  Location: [facility];  Service: Obstetrics;  Laterality: N/A;  . DILATION AND CURETTAGE OF UTERUS      OB History    Gravida Para Term Preterm AB Living   3 1 1  0 2 1   SAB TAB Ectopic Multiple Live Births   2 0 0 0 1       Home Medications    Prior to Admission medications   Medication Sig Start Date End Date Taking? Authorizing Provider  albuterol (PROVENTIL HFA;VENTOLIN HFA) 108 (90 Base) MCG/ACT inhaler Inhale 2 puffs into the lungs every 6 (six) hours as needed for wheezing or shortness of breath (cough, shortness of breath or wheezing.). 08/27/15  Yes Wallis Bamberg, PA-C  amLODipine (NORVASC) 5 MG tablet Take 1 tablet (5 mg total) by mouth daily. Patient not taking: Reported on 03/28/2016 08/27/15   Wallis Bamberg, PA-C  azithromycin (ZITHROMAX) 250 MG tablet Start with 2 tablets today, then 1 daily thereafter. Patient not taking: Reported on 03/28/2016 08/27/15   Wallis Bamberg, PA-C  hydrochlorothiazide (HYDRODIURIL) 25 MG tablet Take 1 tablet (25 mg total) by mouth daily. Patient not taking: Reported on 03/28/2016 08/27/15   Wallis Bamberg, PA-C  HYDROcodone-homatropine [facility]) 5-1.5 MG/5ML syrup Take 5 mLs by mouth at bedtime as needed. Patient not taking: Reported on 03/28/2016 08/27/15   Wallis Bamberg, PA-C  lisinopril (PRINIVIL,ZESTRIL) 20 MG tablet Take 1 tablet (20  mg total) by mouth daily. Patient not taking: Reported on 03/28/2016 06/10/15   Leone Brand, NP  loratadine (CLARITIN) 10 MG tablet Take 1 tablet (10 mg total) by mouth daily. Patient not taking: Reported on 03/28/2016 02/23/16   Hayden Rasmussen, NP  predniSONE (DELTASONE) 20 MG tablet Take 2 tablets daily with breakfast. Patient not taking: Reported on 03/28/2016 08/27/15   Wallis Bamberg, PA-C    Family History Family History  Problem Relation Age of Onset  . Hypertension Mother   . Heart attack Mother     received  stent in her early 27s  . Hypertension Father   . Hypertension Brother   . Hypertension Maternal Grandmother   . Hypertension Maternal Grandfather   . Hypertension Paternal Grandmother   . Hypertension Paternal Grandfather     Social History Social History  Substance Use Topics  . Smoking status: Current Every Day Smoker    Packs/day: 0.50    Years: 5.00    Types: Cigarettes  . Smokeless tobacco: Never Used  . Alcohol use Yes     Comment: socially     Allergies   Lisinopril   Review of Systems Review of Systems  All other systems reviewed and are negative.    Physical Exam Updated Vital Signs BP 157/97 (BP Location: Left Arm)   Pulse 100   Temp 98.8 F (37.1 C) (Oral)   Resp 18   Wt 100.5 kg   SpO2 100%   BMI 35.75 kg/m   Physical Exam  Constitutional: She appears well-developed and well-nourished. No distress.  Obese female resting in bed, in mild discomfort but nontoxic in appearance  HENT:  Head: Atraumatic.  Right Ear: External ear normal.  Left Ear: External ear normal.  Mouth/Throat: Oropharynx is clear and moist.  Mild rhinorrhea  Eyes: Conjunctivae are normal.  Neck: Normal range of motion. Neck supple.  No nuchal rigidity  Cardiovascular: Normal rate, regular rhythm and intact distal pulses.   Pulmonary/Chest: Effort normal and breath sounds normal.  Abdominal: Soft. There is no tenderness.  Musculoskeletal: She exhibits no edema or tenderness.  Neurological: She is alert.  Skin: No rash noted.  Psychiatric: She has a normal mood and affect.  Nursing note and vitals reviewed.    ED Treatments / Results  Labs (all labs ordered are listed, but only abnormal results are displayed) Labs Reviewed  CBC WITH DIFFERENTIAL/PLATELET  BRAIN NATRIURETIC PEPTIDE  URINALYSIS, ROUTINE W REFLEX MICROSCOPIC (NOT AT [facility])  D-DIMER, QUANTITATIVE (NOT AT [facility])  I-STAT CHEM 8, ED  I-STAT TROPOININ, ED  POC URINE PREG, ED    EKG  EKG  Interpretation  Date/Time:  Sunday March 28 2016 20:40:27 EDT Ventricular Rate:  105 PR Interval:  112 QRS Duration: 126 QT Interval:  340 QTC Calculation: 449 R Axis:   71 Text Interpretation:  Sinus tachycardia Non-specific intra-ventricular conduction block Possible Inferior infarct , age undetermined Abnormal ECG Confirmed by HORTON  MD, COURTNEY ([id number]) on 03/28/2016 11:42:19 PM       Radiology Dg Chest 2 View  Result Date: 03/28/2016 CLINICAL DATA:  Initial evaluation for acute cough with cold symptoms and chest congestion. EXAM: CHEST  2 VIEW COMPARISON:  Prior radiograph from 08/27/2015. FINDINGS: Mild cardiomegaly is stable from prior. Mediastinal silhouette within normal limits. Mild perihilar vascular congestion without overt pulmonary edema. No consolidative airspace disease. No pleural effusion. No pneumothorax. No acute osseous abnormality. IMPRESSION: 1. Mild cardiomegaly with pulmonary vascular congestion without overt pulmonary edema. 2. No other  active cardiopulmonary disease identified. Electronically Signed   By: Rise Mu M.D.   On: 03/28/2016 21:33    Procedures Procedures (including critical care time)  Medications Ordered in ED Medications  benzonatate (TESSALON) capsule 100 mg (100 mg Oral Given 03/28/16 2355)     Initial Impression / Assessment and Plan / ED Course  I have reviewed the triage vital signs and the nursing notes.  Pertinent labs & imaging results that were available during my care of the patient were reviewed by me and considered in my medical decision making (see chart for details).  Clinical Course    BP 157/97 (BP Location: Left Arm)   Pulse 100   Temp 98.8 F (37.1 C) (Oral)   Resp 18   Wt 100.5 kg   SpO2 100%   BMI 35.75 kg/m    Final Clinical Impressions(s) / ED Diagnoses   Final diagnoses:  Viral syndrome    New Prescriptions New Prescriptions   No medications on file   11:00 PM Patient here with cold  symptoms. She does not have any nuchal rigidity concerning for meningitis. The lungs are clear. The chest x-rays shows no congestion is without overt pulmonary edema. No focal infiltrate concerning for pneumonia. She is afebrile. Sxs likely viral cold.  However she did have hx of cardiomyopathy.  Therefore, She will need to be evaluated for potential myocarditis.  Will obtain troponin, D-dimer, labs and will monitor.  Care discussed with Dr. Wilkie Aye.  Pt signed out to [employer], PA-C who will continue to monitor pt and f/u on her labs.  Dispo pending labs.  Pt is aware of plan.     Fayrene Helper, PA-C 03/29/16 0023    Doug Sou, MD 03/29/16 [phone]

## 2016-03-29 LAB — I-STAT CHEM 8, ED
BUN: 6 mg/dL (ref 6–20)
Calcium, Ion: 1.17 mmol/L (ref 1.13–1.30)
Chloride: 105 mmol/L (ref 101–111)
Creatinine, Ser: 0.8 mg/dL (ref 0.44–1.00)
Glucose, Bld: 89 mg/dL (ref 65–99)
HCT: 37 % (ref 36.0–46.0)
HEMOGLOBIN: 12.6 g/dL (ref 12.0–15.0)
Potassium: 3.4 mmol/L — ABNORMAL LOW (ref 3.5–5.1)
Sodium: 141 mmol/L (ref 135–145)
TCO2: 23 mmol/L (ref 0–100)

## 2016-03-29 LAB — POC URINE PREG, ED: Preg Test, Ur: NEGATIVE

## 2016-03-29 LAB — I-STAT TROPONIN, ED: TROPONIN I, POC: 0 ng/mL (ref 0.00–0.08)

## 2016-03-29 LAB — URINALYSIS, ROUTINE W REFLEX MICROSCOPIC
BILIRUBIN URINE: NEGATIVE
GLUCOSE, UA: NEGATIVE mg/dL
HGB URINE DIPSTICK: NEGATIVE
KETONES UR: 15 mg/dL — AB
Nitrite: NEGATIVE
PROTEIN: NEGATIVE mg/dL
Specific Gravity, Urine: 1.022 (ref 1.005–1.030)
pH: 5.5 (ref 5.0–8.0)

## 2016-03-29 LAB — CBC WITH DIFFERENTIAL/PLATELET
BASOS PCT: 0 %
Basophils Absolute: 0 10*3/uL (ref 0.0–0.1)
Eosinophils Absolute: 0.2 10*3/uL (ref 0.0–0.7)
Eosinophils Relative: 3 %
HCT: 36.3 % (ref 36.0–46.0)
HEMOGLOBIN: 12.6 g/dL (ref 12.0–15.0)
Lymphocytes Relative: 18 %
Lymphs Abs: 1.4 10*3/uL (ref 0.7–4.0)
MCH: 32.7 pg (ref 26.0–34.0)
MCHC: 34.7 g/dL (ref 30.0–36.0)
MCV: 94.3 fL (ref 78.0–100.0)
MONOS PCT: 6 %
Monocytes Absolute: 0.5 10*3/uL (ref 0.1–1.0)
NEUTROS PCT: 73 %
Neutro Abs: 5.5 10*3/uL (ref 1.7–7.7)
PLATELETS: 143 10*3/uL — AB (ref 150–400)
RBC: 3.85 MIL/uL — AB (ref 3.87–5.11)
RDW: 11.4 % — ABNORMAL LOW (ref 11.5–15.5)
WBC: 7.7 10*3/uL (ref 4.0–10.5)

## 2016-03-29 LAB — URINE MICROSCOPIC-ADD ON

## 2016-03-29 LAB — BRAIN NATRIURETIC PEPTIDE: B NATRIURETIC PEPTIDE 5: 59.2 pg/mL (ref 0.0–100.0)

## 2016-03-29 LAB — D-DIMER, QUANTITATIVE: D-Dimer, Quant: 0.3 ug/mL-FEU (ref 0.00–0.50)

## 2016-03-29 MED ORDER — ALBUTEROL SULFATE HFA 108 (90 BASE) MCG/ACT IN AERS
2.0000 | INHALATION_SPRAY | RESPIRATORY_TRACT | Status: DC | PRN
  Administered 2016-03-29: 2 via RESPIRATORY_TRACT
  Filled 2016-03-29: qty 6.7

## 2016-03-29 MED ORDER — BENZONATATE 100 MG PO CAPS: 100.0000 mg | ORAL_CAPSULE | Freq: Three times a day (TID) | ORAL | 0 refills | Status: DC

## 2016-03-29 MED ORDER — IBUPROFEN 600 MG PO TABS: 600.0000 mg | ORAL_TABLET | Freq: Four times a day (QID) | ORAL | 0 refills | Status: DC | PRN

## 2016-03-29 NOTE — ED Provider Notes (Signed)
2:36 AM Handoff from Ardelle Parkran PA-C at shift change.   Patient with viral respiratory symptoms, but history of cardiomyopathy during pregnancy. Workup was pending.  White blood cell count is normal. Hemoglobin is normal. No increased BNP. EKG is abnormal and slightly changed her troponin is within normal limits. UA is not a clean catch, some white cells, however patient has no urinary symptoms whatsoever. Low suspicion for UTI. D-dimer is negative.  Patient is stable and agrees with discharge to home. Encourage return with worsening symptoms, difficulty breathing, fever, new symptoms or other concerns. She verbalizes understanding and agrees to plan. Will discharge to home with albuterol inhaler and Tessalon for symptom control.  Exam:  Gen NAD; Heart RRR, nml S1,S2, no m/r/g; Lungs CTAB; Abd soft, NT, no rebound or guarding; Ext 2+ pedal pulses bilaterally, no edema.    Results for orders placed or performed during the hospital encounter of 03/28/16  CBC with Differential/Platelet  Result Value Ref Range   WBC 7.7 4.0 - 10.5 K/uL   RBC 3.85 (L) 3.87 - 5.11 MIL/uL   Hemoglobin 12.6 12.0 - 15.0 g/dL   HCT 16.136.3 09.636.0 - 04.546.0 %   MCV 94.3 78.0 - 100.0 fL   MCH 32.7 26.0 - 34.0 pg   MCHC 34.7 30.0 - 36.0 g/dL   RDW 40.911.4 (L) 81.111.5 - 91.415.5 %   Platelets 143 (L) 150 - 400 K/uL   Neutrophils Relative % 73 %   Neutro Abs 5.5 1.7 - 7.7 K/uL   Lymphocytes Relative 18 %   Lymphs Abs 1.4 0.7 - 4.0 K/uL   Monocytes Relative 6 %   Monocytes Absolute 0.5 0.1 - 1.0 K/uL   Eosinophils Relative 3 %   Eosinophils Absolute 0.2 0.0 - 0.7 K/uL   Basophils Relative 0 %   Basophils Absolute 0.0 0.0 - 0.1 K/uL  Brain natriuretic peptide  Result Value Ref Range   B Natriuretic Peptide 59.2 0.0 - 100.0 pg/mL  Urinalysis, Routine w reflex microscopic (not at [facility])  Result Value Ref Range   Color, Urine AMBER (A) YELLOW   APPearance CLOUDY (A) CLEAR   Specific Gravity, Urine 1.022 1.005 - 1.030   pH 5.5 5.0 - 8.0    Glucose, UA NEGATIVE NEGATIVE mg/dL   Hgb urine dipstick NEGATIVE NEGATIVE   Bilirubin Urine NEGATIVE NEGATIVE   Ketones, ur 15 (A) NEGATIVE mg/dL   Protein, ur NEGATIVE NEGATIVE mg/dL   Nitrite NEGATIVE NEGATIVE   Leukocytes, UA SMALL (A) NEGATIVE  D-dimer, quantitative (not at [facility])  Result Value Ref Range   D-Dimer, Quant 0.30 0.00 - 0.50 ug/mL-FEU  Urine microscopic-add on  Result Value Ref Range   Squamous Epithelial / LPF 6-30 (A) NONE SEEN   WBC, UA 6-30 0 - 5 WBC/hpf   RBC / HPF 0-5 0 - 5 RBC/hpf   Bacteria, UA MANY (A) NONE SEEN   Urine-Other MUCOUS PRESENT   I-stat chem 8, ed  Result Value Ref Range   Sodium 141 135 - 145 mmol/L   Potassium 3.4 (L) 3.5 - 5.1 mmol/L   Chloride 105 101 - 111 mmol/L   BUN 6 6 - 20 mg/dL   Creatinine, Ser 7.820.80 0.44 - 1.00 mg/dL   Glucose, Bld 89 65 - 99 mg/dL   Calcium, Ion 9.561.17 2.131.13 - 1.30 mmol/L   TCO2 23 0 - 100 mmol/L   Hemoglobin 12.6 12.0 - 15.0 g/dL   HCT 08.637.0 57.836.0 - 46.946.0 %  I-stat troponin, ED  Result Value  Ref Range   Troponin i, poc 0.00 0.00 - 0.08 ng/mL   Comment 3          POC urine preg, ED (not at [facility])  Result Value Ref Range   Preg Test, Ur NEGATIVE NEGATIVE   Dg Chest 2 View  Result Date: 03/28/2016 CLINICAL DATA:  Initial evaluation for acute cough with cold symptoms and chest congestion. EXAM: CHEST  2 VIEW COMPARISON:  Prior radiograph from 08/27/2015. FINDINGS: Mild cardiomegaly is stable from prior. Mediastinal silhouette within normal limits. Mild perihilar vascular congestion without overt pulmonary edema. No consolidative airspace disease. No pleural effusion. No pneumothorax. No acute osseous abnormality. IMPRESSION: 1. Mild cardiomegaly with pulmonary vascular congestion without overt pulmonary edema. 2. No other active cardiopulmonary disease identified. Electronically Signed   By: Rise Mu M.D.   On: 03/28/2016 21:33      Renne Crigler, PA-C 03/29/16 2778    Shon Baton,  MD 03/31/16 [phone]

## 2016-03-29 NOTE — Discharge Instructions (Signed)
Please read and follow all provided instructions.  Your diagnoses today include:  1. Viral syndrome   2. Cough     You appear to have an upper respiratory infection (URI). An upper respiratory tract infection, or cold, is a viral infection of the air passages leading to the lungs. It should improve gradually after 5-7 days. You may have a lingering cough that lasts for 2- 4 weeks after the infection.  Tests performed today include:  Vital signs. See below for your results today.   Medications prescribed:   Albuterol inhaler - medication that opens up your airway  Use inhaler as follows: 1-2 puffs with spacer every 4 hours as needed for wheezing, cough, or shortness of breath.    Tessalon Perles - cough suppressant medication  Take any prescribed medications only as directed. Treatment for your infection is aimed at treating the symptoms. There are no medications, such as antibiotics, that will cure your infection.   Home care instructions:  Follow any educational materials contained in this packet.   Your illness is contagious and can be spread to others, especially during the first 3 or 4 days. It cannot be cured by antibiotics or other medicines. Take basic precautions such as washing your hands often, covering your mouth when you cough or sneeze, and avoiding public places where you could spread your illness to others.   Please continue drinking plenty of fluids.  Use over-the-counter medicines as needed as directed on packaging for symptom relief.  You may also use ibuprofen or tylenol as directed on packaging for pain or fever.  Do not take multiple medicines containing Tylenol or acetaminophen to avoid taking too much of this medication.  Follow-up instructions: Please follow-up with your primary care provider in the next 3 days for further evaluation of your symptoms if you are not feeling better.   Return instructions:   Please return to the Emergency Department if you  experience worsening symptoms.   RETURN IMMEDIATELY IF you develop shortness of breath, confusion or altered mental status, a new rash, become dizzy, faint, or poorly responsive, or are unable to be cared for at home.  Please return if you have persistent vomiting and cannot keep down fluids or develop a fever that is not controlled by tylenol or motrin.    Please return if you have any other emergent concerns.  Additional Information:  Your vital signs today were: BP 157/97 (BP Location: Left Arm)    Pulse 100    Temp 98.8 F (37.1 C) (Oral)    Resp 18    Wt 100.5 kg    SpO2 100%    BMI 35.75 kg/m  If your blood pressure (BP) was elevated above 135/85 this visit, please have this repeated by your doctor within one month. --------------

## 2017-03-22 ENCOUNTER — Emergency Department (HOSPITAL_COMMUNITY): Payer: Medicaid Other

## 2017-03-22 ENCOUNTER — Encounter (HOSPITAL_COMMUNITY): Payer: Self-pay | Admitting: Emergency Medicine

## 2017-03-22 ENCOUNTER — Observation Stay (HOSPITAL_COMMUNITY)
Admission: EM | Admit: 2017-03-22 | Discharge: 2017-03-24 | Disposition: A | Discharge status: Home / Self Care | Payer: Medicaid Other | Attending: Cardiology | Admitting: Cardiology

## 2017-03-22 DIAGNOSIS — I420 Dilated cardiomyopathy: Secondary | ICD-10-CM | POA: Insufficient documentation

## 2017-03-22 DIAGNOSIS — F319 Bipolar disorder, unspecified: Secondary | ICD-10-CM | POA: Diagnosis not present

## 2017-03-22 DIAGNOSIS — I4892 Unspecified atrial flutter: Secondary | ICD-10-CM | POA: Diagnosis not present

## 2017-03-22 DIAGNOSIS — Z7901 Long term (current) use of anticoagulants: Secondary | ICD-10-CM | POA: Diagnosis not present

## 2017-03-22 DIAGNOSIS — Z79899 Other long term (current) drug therapy: Secondary | ICD-10-CM | POA: Insufficient documentation

## 2017-03-22 DIAGNOSIS — R000 Tachycardia, unspecified: Secondary | ICD-10-CM | POA: Diagnosis present

## 2017-03-22 DIAGNOSIS — E785 Hyperlipidemia, unspecified: Secondary | ICD-10-CM | POA: Insufficient documentation

## 2017-03-22 DIAGNOSIS — Z6837 Body mass index (BMI) 37.0-37.9, adult: Secondary | ICD-10-CM | POA: Insufficient documentation

## 2017-03-22 DIAGNOSIS — I10 Essential (primary) hypertension: Secondary | ICD-10-CM | POA: Insufficient documentation

## 2017-03-22 DIAGNOSIS — I43 Cardiomyopathy in diseases classified elsewhere: Secondary | ICD-10-CM | POA: Diagnosis present

## 2017-03-22 DIAGNOSIS — F17210 Nicotine dependence, cigarettes, uncomplicated: Secondary | ICD-10-CM | POA: Insufficient documentation

## 2017-03-22 LAB — CBC WITH DIFFERENTIAL/PLATELET
BASOS ABS: 0 10*3/uL (ref 0.0–0.1)
Basophils Relative: 0 %
EOS PCT: 3 %
Eosinophils Absolute: 0.2 10*3/uL (ref 0.0–0.7)
HEMATOCRIT: 37.3 % (ref 36.0–46.0)
Hemoglobin: 13.1 g/dL (ref 12.0–15.0)
LYMPHS PCT: 33 %
Lymphs Abs: 2.3 10*3/uL (ref 0.7–4.0)
MCH: 33.3 pg (ref 26.0–34.0)
MCHC: 35.1 g/dL (ref 30.0–36.0)
MCV: 94.9 fL (ref 78.0–100.0)
Monocytes Absolute: 0.5 10*3/uL (ref 0.1–1.0)
Monocytes Relative: 7 %
NEUTROS ABS: 4 10*3/uL (ref 1.7–7.7)
Neutrophils Relative %: 57 %
PLATELETS: 179 10*3/uL (ref 150–400)
RBC: 3.93 MIL/uL (ref 3.87–5.11)
RDW: 12.7 % (ref 11.5–15.5)
WBC: 7.1 10*3/uL (ref 4.0–10.5)

## 2017-03-22 LAB — URINALYSIS, ROUTINE W REFLEX MICROSCOPIC
Bilirubin Urine: NEGATIVE
Glucose, UA: NEGATIVE mg/dL
Ketones, ur: 5 mg/dL — AB
LEUKOCYTES UA: NEGATIVE
Nitrite: NEGATIVE
Protein, ur: 30 mg/dL — AB
SPECIFIC GRAVITY, URINE: 1.023 (ref 1.005–1.030)
pH: 5 (ref 5.0–8.0)

## 2017-03-22 LAB — I-STAT TROPONIN, ED: Troponin i, poc: 0.02 ng/mL (ref 0.00–0.08)

## 2017-03-22 LAB — I-STAT BETA HCG BLOOD, ED (MC, WL, AP ONLY)

## 2017-03-22 LAB — BASIC METABOLIC PANEL
ANION GAP: 7 (ref 5–15)
BUN: 12 mg/dL (ref 6–20)
CO2: 23 mmol/L (ref 22–32)
Calcium: 9.2 mg/dL (ref 8.9–10.3)
Chloride: 111 mmol/L (ref 101–111)
Creatinine, Ser: 1.06 mg/dL — ABNORMAL HIGH (ref 0.44–1.00)
Glucose, Bld: 102 mg/dL — ABNORMAL HIGH (ref 65–99)
POTASSIUM: 3.9 mmol/L (ref 3.5–5.1)
SODIUM: 141 mmol/L (ref 135–145)

## 2017-03-22 MED ORDER — DILTIAZEM HCL-DEXTROSE 100-5 MG/100ML-% IV SOLN (PREMIX)
5.0000 mg/h | INTRAVENOUS | Status: DC
Start: 2017-03-22 — End: 2017-03-23
  Administered 2017-03-22: 5 mg/h via INTRAVENOUS
  Filled 2017-03-22: qty 100

## 2017-03-22 MED ORDER — DILTIAZEM LOAD VIA INFUSION: 10.0000 mg | Freq: Once | INTRAVENOUS | Status: DC

## 2017-03-22 MED ORDER — METOPROLOL TARTRATE 5 MG/5ML IV SOLN
2.5000 mg | Freq: Once | INTRAVENOUS | Status: AC
  Administered 2017-03-22: 2.5 mg via INTRAVENOUS

## 2017-03-22 MED ORDER — DILTIAZEM LOAD VIA INFUSION
15.0000 mg | Freq: Once | INTRAVENOUS | Status: AC
  Administered 2017-03-22: 15 mg via INTRAVENOUS
  Filled 2017-03-22: qty 15

## 2017-03-22 MED ORDER — IBUPROFEN 200 MG PO TABS
600.0000 mg | ORAL_TABLET | Freq: Once | ORAL | Status: AC
  Administered 2017-03-22: 600 mg via ORAL
  Filled 2017-03-22: qty 3

## 2017-03-22 MED ORDER — METOPROLOL TARTRATE 5 MG/5ML IV SOLN
2.5000 mg | Freq: Once | INTRAVENOUS | Status: AC
  Administered 2017-03-22: 2.5 mg via INTRAVENOUS
  Filled 2017-03-22: qty 5

## 2017-03-22 MED ORDER — DILTIAZEM HCL 25 MG/5ML IV SOLN
10.0000 mg | Freq: Once | INTRAVENOUS | Status: AC
Start: 2017-03-22 — End: 2017-03-22
  Administered 2017-03-22: 10 mg via INTRAVENOUS
  Filled 2017-03-22: qty 5

## 2017-03-22 NOTE — ED Provider Notes (Signed)
[facility]-EMERGENCY DEPT Provider Note   CSN: [id number] Arrival date & time: 03/22/17  1840     History   Chief Complaint Chief Complaint  Patient presents with  . Tachycardia    HPI ANGELLE FERRAIOLO is a 31 y.o. female.  HPI 31 year old African-American female past medical history significant for hypertension, migraines, atrial flutter, cardiomyopathy, that presents to the ED to with complaints of palpitations. Patient states that she has been feeling like her "heart is pounding" since Sunday. States that she thought was a hangover but it did not go away. Patient reports some mild shortness of breath. She denies any chest pain, diaphoresis, nausea, emesis. The patient states her symptoms have been constant. Nothing makes better or worse. She has not taken any of her symptoms. Does have a history of same in 2015. At that time she was diagnosed A flutter. She was cardioverted in the hospital. He states that she was started on blood thinners at that time. Her blood thinners were DC'd in 2016 after she saw cardiology. States that she has not seen cardiology since. States that she was supposed to be taking aspirin but has not been taking. Patient denies any lower extremity edema. Denies any prolonged immobilizations, recent hospitalizations or surgeries, history of DVT/PE, lower extremity edema or calf tenderness. Does report pain with Depo for birth control.   Pt denies any fever, chill, ha, vision changes, lightheadedness, dizziness, congestion, neck pain, cp, cough, abd pain, n/v/d, urinary symptoms, change in bowel habits, melena, hematochezia, lower extremity paresthesias.  Past Medical History:  Diagnosis Date  . Allergy   . Bipolar disorder (HCC) 2014  . Cardiomyopathy (HCC)    a. Echo (10/15):  EF 35-40%, mild MR, mild to moderately reduced RV systolic function, PASP 31 mmHg  ; b.  Echo (12/15):  Posterior basal HK, mild LVH, EF 55%, mild LAE.  Marland Kitchen Headache(784.0)   . History of chicken  pox   . Hx of migraines   . Hyperlipidemia   . Hypertension   . Infection    trich    Patient Active Problem List   Diagnosis Date Noted  . Contraception management 10/21/2014  . Morbid obesity (HCC) 10/21/2014  . 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 Benign essential HTN 07/25/2014  . LVH (left ventricular hypertrophy) 07/25/2014  . Atrial flutter (HCC) 05/16/2014  . Abnormal chest x-ray 05/16/2014  . Breech presentation delivered 01/17/2014  . Migraine 08/08/2012  . Oligo-ovulation 03/19/2011    Past Surgical History:  Procedure Laterality Date  . ADENOIDECTOMY    . CESAREAN SECTION N/A 01/17/2014   Procedure: CESAREAN SECTION;  Surgeon: Brock Bad, MD;  Location: [facility];  Service: Obstetrics;  Laterality: N/A;  . DILATION AND CURETTAGE OF UTERUS      OB History    Gravida Para Term Preterm AB Living   3 1 1  0 2 1   SAB TAB Ectopic Multiple Live Births   2 0 0 0 1       Home Medications    Prior to Admission medications   Medication Sig Start Date End Date Taking? Authorizing Provider  albuterol (PROVENTIL HFA;VENTOLIN HFA) 108 (90 Base) MCG/ACT inhaler Inhale 2 puffs into the lungs every 6 (six) hours as needed for wheezing or shortness of breath (cough, shortness of breath or wheezing.). 08/27/15  Yes Wallis Bamberg, PA-C  aspirin-acetaminophen-caffeine (EXCEDRIN MIGRAINE) [phone] MG tablet Take 2 tablets by mouth every 6 (six) hours as needed for headache or migraine.  Yes [provider]  levonorgestrel (MIRENA) 20 MCG/24HR IUD 1 each by Intrauterine route once.   Yes [provider]  benzonatate (TESSALON) 100 MG capsule Take 1 capsule (100 mg total) by mouth every 8 (eight) hours. Patient not taking: Reported on 03/22/2017 03/29/16   Renne Crigler, PA-C  ibuprofen (ADVIL,MOTRIN) 600 MG tablet Take 1 tablet (600 mg total) by mouth every 6 (six) hours as needed. Patient not taking: Reported on 03/22/2017 03/29/16    Renne Crigler, PA-C    Family History Family History  Problem Relation Age of Onset  . Hypertension Mother   . Heart attack Mother        received stent in her early 20s  . Hypertension Father   . Hypertension Brother   . Hypertension Maternal Grandmother   . Hypertension Maternal Grandfather   . Hypertension Paternal Grandmother   . Hypertension Paternal Grandfather     Social History Social History  Substance Use Topics  . Smoking status: Current Every Day Smoker    Packs/day: 0.50    Years: 5.00    Types: Cigarettes  . Smokeless tobacco: Never Used  . Alcohol use Yes     Comment: socially     Allergies   Lisinopril   Review of Systems Review of Systems  Constitutional: Negative for chills, diaphoresis and fever.  HENT: Negative for congestion.   Eyes: Negative for visual disturbance.  Respiratory: Positive for shortness of breath. Negative for cough.   Cardiovascular: Negative for chest pain, palpitations and leg swelling.  Gastrointestinal: Negative for abdominal pain, diarrhea, nausea and vomiting.  Genitourinary: Negative for dysuria, flank pain, frequency, hematuria and urgency.  Musculoskeletal: Negative for arthralgias and myalgias.  Skin: Negative for rash.  Neurological: Negative for dizziness, syncope, weakness, light-headedness, numbness and headaches.  Psychiatric/Behavioral: Negative for sleep disturbance. The patient is not nervous/anxious.      Physical Exam Updated Vital Signs BP (!) 148/127   Pulse (!) 150   Temp 98.8 F (37.1 C) (Oral)   Resp 18   Ht 5\' 6"  (1.676 m)   Wt 104.3 kg (230 lb)   SpO2 99%   BMI 37.12 kg/m   Physical Exam  Constitutional: She is oriented to person, place, and time. She appears well-developed and well-nourished.  Non-toxic appearance. No distress.  HENT:  Head: Normocephalic and atraumatic.  Nose: Nose normal.  Mouth/Throat: Oropharynx is clear and moist.  Eyes: Pupils are equal, round, and reactive to  light. Conjunctivae are normal. Right eye exhibits no discharge. Left eye exhibits no discharge.  Neck: Normal range of motion. Neck supple. No JVD present. No tracheal deviation present.  Cardiovascular: Regular rhythm, normal heart sounds and intact distal pulses.  Exam reveals no gallop and no friction rub.   No murmur heard. Hr 150  Pulmonary/Chest: Effort normal and breath sounds normal. No respiratory distress. She exhibits no tenderness.  No hypoxia or tachypnea.  Abdominal: Soft. Bowel sounds are normal. She exhibits no distension. There is no tenderness. There is no rebound and no guarding.  Musculoskeletal: Normal range of motion.  No lower extremity edema or calf tenderness.  Lymphadenopathy:    She has no cervical adenopathy.  Neurological: She is alert and oriented to person, place, and time.  Skin: Skin is warm and dry. Capillary refill takes less than 2 seconds. She is not diaphoretic.  Psychiatric: Her behavior is normal. Judgment and thought content normal.  Nursing note and vitals reviewed.    ED Treatments / Results  Labs (all labs ordered are listed, but only abnormal results are displayed) Labs Reviewed  BASIC METABOLIC PANEL - Abnormal; Notable for the following:       Result Value   Glucose, Bld 102 (*)    Creatinine, Ser 1.06 (*)    All other components within normal limits  CBC WITH DIFFERENTIAL/PLATELET  URINALYSIS, ROUTINE W REFLEX MICROSCOPIC  I-STAT TROPONIN, ED  I-STAT BETA HCG BLOOD, ED ([facility], [facility], [facility] ONLY)    EKG  EKG Interpretation None       Radiology Dg Chest 2 View  Result Date: 03/22/2017 CLINICAL DATA:  Tachycardia and hypertension EXAM: CHEST  2 VIEW COMPARISON:  03/28/2016 FINDINGS: The heart size and mediastinal contours are stable with borderline cardiomegaly. No aortic aneurysm. Both lungs are clear. The visualized skeletal structures are unremarkable. IMPRESSION: No active cardiopulmonary disease. Electronically Signed   By: Tollie Eth M.D.   On: 03/22/2017 20:19    Procedures Procedures (including critical care time)  Medications Ordered in ED Medications  diltiazem (CARDIZEM) 1 mg/mL load via infusion 15 mg (15 mg Intravenous Bolus from Bag 03/22/17 2116)    And  diltiazem (CARDIZEM) 100 mg in dextrose 5% (1 mg/mL) infusion (10 mg/hr Intravenous Rate/Dose Change 03/22/17 2210)  diltiazem (CARDIZEM) injection 10 mg (10 mg Intravenous Given 03/22/17 1952)  ibuprofen (ADVIL,MOTRIN) tablet 600 mg (600 mg Oral Given 03/22/17 2115)     Initial Impression / Assessment and Plan / ED Course  I have reviewed the triage vital signs and the nursing notes.  Pertinent labs & imaging results that were available during my care of the patient were reviewed by me and considered in my medical decision making (see chart for details).     Patient resents to the ED with complaints of palpitations and mild shortness of breath. In triage she was noted to be in atrial flutter with a heart rate of 150. Patient with history of same. Patient has required admission with cardioversion in the hospital for same 2 years ago. Patient is not currently anticoagulated. The patient states her symptoms have been ongoing for the past 3 days.  Heart rate is noted to be in the 150s. Blood pressure is 140/110. Patient does not appear to be in distress.  X-ray is unremarkable. Troponin is negative. Lab work is unremarkable and at baseline. Patient was given Cardizem bolus with no change. Heart rate remains at 155.  An additional bolus and infusion was started. Patient 15 mg of Cardizem drip at this time. Heart rate remains in the 150s. Spoke with cardiology who recommends 2 doses of 2.5 mg Lopressor. Rate remains at 140. Patient remains hemodynamically stable. She is not hypotensive. Conscious alert and oriented 4. Cards now recommends IV amiodarone with bolus and infusion. We'll start heparin bolus as well.  Dr. Mayford Knife with cardiology has requested  hospital admission. However hospitalist will not accept patient at this time due to heart rate remaining in the 140s and patient potentially becoming unstable.  Initiating amnio infusion and drip at this time. Will monitor heart rate and with improvement patient will be admitted to hospitalist. Pt will monitor pt and call hospitalist for admission once rate slows. Pt remains hemodynamically stable at this time.  Pt seen and examined by Dr. Clarene Duke who is agreeable to the above plan.  CRITICAL CARE Performed by: Demetrios Loll   Total critical care time: 75 minutes  Critical care time was exclusive of separately billable procedures and treating other patients.  Critical  care was necessary to treat or prevent imminent or life-threatening deterioration.  Critical care was time spent personally by me on the following activities: development of treatment plan with patient and/or surrogate as well as nursing, discussions with consultants, evaluation of patient's response to treatment, examination of patient, obtaining history from patient or surrogate, ordering and performing treatments and interventions, ordering and review of laboratory studies, ordering and review of radiographic studies, pulse oximetry and re-evaluation of patient's condition.  Final Clinical Impressions(s) / ED Diagnoses   Final diagnoses:  None    New Prescriptions New Prescriptions   No medications on file     Wallace Keller 03/23/17 0103    Little, Ambrose Finland, MD 03/25/17 [phone]

## 2017-03-22 NOTE — ED Notes (Signed)
Pt just came into room. Complaint of "feeling like heart pounding" since Sunday; "thought it was from a hangover but it didn't go away." Pt denies pain.

## 2017-03-23 ENCOUNTER — Other Ambulatory Visit: Payer: Self-pay

## 2017-03-23 DIAGNOSIS — I10 Essential (primary) hypertension: Secondary | ICD-10-CM

## 2017-03-23 DIAGNOSIS — I4892 Unspecified atrial flutter: Secondary | ICD-10-CM | POA: Diagnosis present

## 2017-03-23 DIAGNOSIS — Z79899 Other long term (current) drug therapy: Secondary | ICD-10-CM | POA: Diagnosis not present

## 2017-03-23 DIAGNOSIS — I420 Dilated cardiomyopathy: Secondary | ICD-10-CM | POA: Diagnosis not present

## 2017-03-23 DIAGNOSIS — I43 Cardiomyopathy in diseases classified elsewhere: Secondary | ICD-10-CM

## 2017-03-23 DIAGNOSIS — R000 Tachycardia, unspecified: Secondary | ICD-10-CM

## 2017-03-23 LAB — I-STAT TROPONIN, ED: Troponin i, poc: 0.02 ng/mL (ref 0.00–0.08)

## 2017-03-23 LAB — HEPARIN LEVEL (UNFRACTIONATED)
HEPARIN UNFRACTIONATED: 0.33 [IU]/mL (ref 0.30–0.70)
HEPARIN UNFRACTIONATED: 0.24 [IU]/mL — AB (ref 0.30–0.70)

## 2017-03-23 LAB — PROTIME-INR
INR: 1.01
Prothrombin Time: 13.3 seconds (ref 11.4–15.2)

## 2017-03-23 LAB — MRSA PCR SCREENING: MRSA BY PCR: NEGATIVE

## 2017-03-23 LAB — APTT: APTT: 33 s (ref 24–36)

## 2017-03-23 MED ORDER — AMIODARONE HCL IN DEXTROSE 360-4.14 MG/200ML-% IV SOLN
30.0000 mg/h | INTRAVENOUS | Status: DC
  Administered 2017-03-23 – 2017-03-24 (×2): 30 mg/h via INTRAVENOUS
  Filled 2017-03-23 (×2): qty 200

## 2017-03-23 MED ORDER — ASPIRIN 300 MG RE SUPP: 300.0000 mg | RECTAL | Status: AC

## 2017-03-23 MED ORDER — ONDANSETRON HCL 4 MG/2ML IJ SOLN
4.0000 mg | Freq: Four times a day (QID) | INTRAMUSCULAR | Status: DC | PRN
Start: 2017-03-23 — End: 2017-03-24

## 2017-03-23 MED ORDER — ASPIRIN 81 MG PO CHEW
324.0000 mg | CHEWABLE_TABLET | ORAL | Status: AC
  Administered 2017-03-23: 324 mg via ORAL
  Filled 2017-03-23: qty 4

## 2017-03-23 MED ORDER — DILTIAZEM HCL-DEXTROSE 100-5 MG/100ML-% IV SOLN (PREMIX)
5.0000 mg/h | INTRAVENOUS | Status: DC
  Administered 2017-03-23: 5 mg/h via INTRAVENOUS
  Filled 2017-03-23 (×2): qty 100

## 2017-03-23 MED ORDER — METOPROLOL TARTRATE 25 MG PO TABS
25.0000 mg | ORAL_TABLET | Freq: Two times a day (BID) | ORAL | Status: DC
  Administered 2017-03-23 – 2017-03-24 (×3): 25 mg via ORAL
  Filled 2017-03-23 (×3): qty 1

## 2017-03-23 MED ORDER — ASPIRIN EC 81 MG PO TBEC: 81.0000 mg | DELAYED_RELEASE_TABLET | Freq: Every day | ORAL | Status: DC

## 2017-03-23 MED ORDER — HEPARIN BOLUS VIA INFUSION
4000.0000 [IU] | Freq: Once | INTRAVENOUS | Status: AC
  Administered 2017-03-23: 4000 [IU] via INTRAVENOUS
  Filled 2017-03-23: qty 4000

## 2017-03-23 MED ORDER — AMIODARONE IV BOLUS ONLY 150 MG/100ML
150.0000 mg | Freq: Once | INTRAVENOUS | Status: AC
  Administered 2017-03-23: 150 mg via INTRAVENOUS
  Filled 2017-03-23: qty 100

## 2017-03-23 MED ORDER — AMIODARONE HCL IN DEXTROSE 360-4.14 MG/200ML-% IV SOLN
60.0000 mg/h | INTRAVENOUS | Status: AC
  Administered 2017-03-23: 60 mg/h via INTRAVENOUS
  Filled 2017-03-23 (×2): qty 200

## 2017-03-23 MED ORDER — HEPARIN (PORCINE) IN NACL 100-0.45 UNIT/ML-% IJ SOLN
1100.0000 [IU]/h | INTRAMUSCULAR | Status: DC
  Administered 2017-03-23: 1100 [IU]/h via INTRAVENOUS
  Filled 2017-03-23 (×2): qty 250

## 2017-03-23 MED ORDER — DEXTROSE 5 % IV SOLN: 60.0000 mg/h | Freq: Once | INTRAVENOUS | Status: DC

## 2017-03-23 MED ORDER — ACETAMINOPHEN 500 MG PO TABS
ORAL_TABLET | ORAL | Status: AC
  Filled 2017-03-23: qty 2

## 2017-03-23 MED ORDER — HEPARIN (PORCINE) IN NACL 100-0.45 UNIT/ML-% IJ SOLN
1250.0000 [IU]/h | INTRAMUSCULAR | Status: DC
  Administered 2017-03-23: 1250 [IU]/h via INTRAVENOUS

## 2017-03-23 MED ORDER — HYDRALAZINE HCL 20 MG/ML IJ SOLN
5.0000 mg | Freq: Four times a day (QID) | INTRAMUSCULAR | Status: DC | PRN
Start: 2017-03-23 — End: 2017-03-24
  Administered 2017-03-23 (×2): 5 mg via INTRAVENOUS
  Filled 2017-03-23 (×2): qty 1

## 2017-03-23 MED ORDER — ALPRAZOLAM 0.25 MG PO TABS
0.2500 mg | ORAL_TABLET | Freq: Two times a day (BID) | ORAL | Status: DC | PRN
  Administered 2017-03-24: 0.25 mg via ORAL
  Filled 2017-03-23: qty 1

## 2017-03-23 MED ORDER — ACETAMINOPHEN 325 MG PO TABS
650.0000 mg | ORAL_TABLET | ORAL | Status: DC | PRN
  Administered 2017-03-23 – 2017-03-24 (×3): 650 mg via ORAL
  Filled 2017-03-23 (×3): qty 2

## 2017-03-23 MED ORDER — METOPROLOL TARTRATE 25 MG PO TABS: 25.0000 mg | ORAL_TABLET | Freq: Two times a day (BID) | ORAL | Status: DC

## 2017-03-23 MED ORDER — ZOLPIDEM TARTRATE 5 MG PO TABS: 5.0000 mg | ORAL_TABLET | Freq: Every evening | ORAL | Status: DC | PRN

## 2017-03-23 MED ORDER — ACETAMINOPHEN 500 MG PO TABS
1000.0000 mg | ORAL_TABLET | Freq: Once | ORAL | Status: AC
  Administered 2017-03-23: 1000 mg via ORAL

## 2017-03-23 NOTE — ED Provider Notes (Signed)
Patient has done well overnight. She has not had any change in her rhythm with the addition of amiodarone. She has now failed Lopressor, Cardizem, amiodarone. We are unfortunately unable to get her admitted to the hospitalist service because they felt that she was unstable with her heart rate being elevated. I watched her in the ER overnight and she did well. Discussed with Dr. [place], on call for cardiology. Patient will be seen in the ER this morning by cardiology service.   [name], MD 03/23/17 [phone]

## 2017-03-23 NOTE — Progress Notes (Signed)
ANTICOAGULATION CONSULT NOTE - Initial Consult  Pharmacy Consult for IV heparin Indication: atrial fibrillation  Allergies  Allergen Reactions  . Lisinopril Swelling    Patient Measurements: Height: 5\' 6"  (167.6 cm) Weight: 230 lb (104.3 kg) IBW/kg (Calculated) : 59.3 Heparin Dosing Weight: 72 kg  Vital Signs: Temp: 98.8 F (37.1 C) (08/07 1856) Temp Source: Oral (08/07 1856) BP: 138/111 (08/08 0000) Pulse Rate: 144 (08/08 0000)  Labs:  Recent Labs  03/22/17 1912  HGB 13.1  HCT 37.3  PLT 179  CREATININE 1.06*    Estimated Creatinine Clearance: 96.4 mL/min (A) (by C-G formula based on SCr of 1.06 mg/dL (H)).   Medical History: Past Medical History:  Diagnosis Date  . Allergy   . Bipolar disorder (HCC) 2014  . Cardiomyopathy (HCC)    a. Echo (10/15):  EF 35-40%, mild MR, mild to moderately reduced RV systolic function, PASP 31 mmHg  ; b.  Echo (12/15):  Posterior basal HK, mild LVH, EF 55%, mild LAE.  Marland Kitchen Headache(784.0)   . History of chicken pox   . Hx of migraines   . Hyperlipidemia   . Hypertension   . Infection    trich    Medications:  Scheduled:  . heparin  4,000 Units Intravenous Once   Infusions:  . amiodarone    . amiodarone    . diltiazem (CARDIZEM) infusion 15 mg/hr (03/22/17 2236)  . heparin      Assessment: 30 yoF presenting in a-flutter.  IV heparin per Rx Goal of Therapy:  Heparin level 0.3-0.7 units/ml Monitor platelets by anticoagulation protocol: Yes   Plan:  Baseline PT/INR and aPtt STAT Heparin 4000 unit bolus x1 now Start heparin drip at 1100 units/hr Daily CBC/HL Check 1st HL 6 hours after drip started  Tracy Grant 03/23/2017,12:53 AM

## 2017-03-23 NOTE — Progress Notes (Signed)
ANTICOAGULATION CONSULT NOTE - follow-up  Pharmacy Consult for IV heparin Indication: atrial fibrillation  Allergies  Allergen Reactions  . Lisinopril Swelling    Patient Measurements: Height: 5\' 6"  (167.6 cm) Weight: 230 lb (104.3 kg) IBW/kg (Calculated) : 59.3 Heparin Dosing Weight: 72 kg  Vital Signs: BP: 152/115 (08/08 1000) Pulse Rate: 134 (08/08 1000)  Labs:  Recent Labs  03/22/17 1912 03/22/17 1942 03/23/17 0914  HGB 13.1  --   --   HCT 37.3  --   --   PLT 179  --   --   APTT  --  33  --   LABPROT  --  13.3  --   INR  --  1.01  --   HEPARINUNFRC  --   --  0.33  CREATININE 1.06*  --   --     Estimated Creatinine Clearance: 96.4 mL/min (A) (by C-G formula based on SCr of 1.06 mg/dL (H)).   Assessment: 30 yoF presenting in a-flutter. CHA2DS2 VASc= 2.  IV heparin per Rx  Today, 03/23/2017   Initial heparin level is therapeutic at 0.33 on heparin 1100 units/hr  CBC: Hgb and pltc WNL  Plan for TEE and DCCV 8/9  Per cardiology note, on xarelto in past but stopped in 2016 d/t vaginal bleeding   Goal of Therapy:  Heparin level 0.3-0.7 units/ml Monitor platelets by anticoagulation protocol: Yes   Plan:   Continue heparin at 1100 units/hr  Follow-up confirmatory heparin level in ~6h   Daily CBC and heparin level  Monitor for bleeding  Await long-term plan for anticoagulation  Juliette Alcide, PharmD, BCPS.   Pager: [phone] 03/23/2017 11:03 AM

## 2017-03-23 NOTE — Progress Notes (Signed)
Asked by PA Leaphart to admit this patient presenting in atrial flutter.  She has a h/o aflutter that required TEE and cardioversion to resolve (2015).  She has been in the ER with HR 150 without improvement despite max dose Cardizem.  Cardiology was consulted but there are no current beds available at [facility].  Cardiology made some additional recommendations.  Until those recommendations have been followed and/or cardiology is willing to see the patient and/or a bed at [facility] is available, the admission to [facility] will be deferred in the interests of patient safety.  She needs stabilization and an appropriate plan in order to be safely admitted at this time.  Georgana Curio, M.D.

## 2017-03-23 NOTE — Progress Notes (Signed)
ANTICOAGULATION CONSULT NOTE - follow-up  Pharmacy Consult for IV heparin Indication: atrial fibrillation  Allergies  Allergen Reactions  . Lisinopril Swelling    Patient Measurements: Height: 5\' 6"  (167.6 cm) Weight: 230 lb 2.6 oz (104.4 kg) IBW/kg (Calculated) : 59.3 Heparin Dosing Weight: 72 kg  Vital Signs: Temp: 98.5 F (36.9 C) (08/08 1615) Temp Source: Oral (08/08 1615) BP: 138/88 (08/08 1700) Pulse Rate: 68 (08/08 1700)  Labs:  Recent Labs  03/22/17 1912 03/22/17 1942 03/23/17 0914 03/23/17 1543  HGB 13.1  --   --   --   HCT 37.3  --   --   --   PLT 179  --   --   --   APTT  --  33  --   --   LABPROT  --  13.3  --   --   INR  --  1.01  --   --   HEPARINUNFRC  --   --  0.33 0.24*  CREATININE 1.06*  --   --   --     Estimated Creatinine Clearance: 96.4 mL/min (A) (by C-G formula based on SCr of 1.06 mg/dL (H)).   Assessment: 30 yoF presenting in a-flutter. CHA2DS2 VASc= 2.  IV heparin per Rx  Today, 03/23/2017   Heparin level is low at 0.24 with heparin at 1100 units/hr  CBC: Hgb and pltc WNL  Plan for TEE and DCCV 8/9  Per cardiology note, on xarelto in past but stopped in 2016 d/t vaginal bleeding   Goal of Therapy:  Heparin level 0.3-0.7 units/ml Monitor platelets by anticoagulation protocol: Yes   Plan:   Increase heparin to 1250 units/hr  Check heparin level in 6 hours  Daily CBC and heparin level  Monitor for bleeding  Await long-term plan for anticoagulation  Clance Boll, PharmD, BCPS Pager: [phone] 03/23/2017 6:11 PM

## 2017-03-23 NOTE — H&P (Signed)
Cardiology Admission History and Physical:   Patient ID: Tracy Grant; MRN: [id number]; DOB: 05-03-1986   Admission date: 03/22/2017  Primary Care Provider: Patient, No Pcp Per Primary Cardiologist: Dr Mayford Knife Primary Electrophysiologist:  Dr Graciela Husbands  Chief Complaint:  tachycardia  Patient Profile:   Tracy Grant is a pleasant 31 y.o. female with a history of atrial flutter whom presented to [facility] ED 03/22/17 pm with tachycardia.  History of Present Illness:   [name] has a history of post partum atrial flutter in Oct 2015 with associated tachycardia induced cardiomyopathy. Her EF improved from 35% in Oct 2015 to 55% in Dec 2015. She has a history of HTN and was felt to be a CHA2DS2 VASc=2. She was initially placed on Toprol 100 mg and Xarelto, in addition ti Lisinopril which she took for HTN. Her Xarelto was stopped in June 2016 secondary to vaginal bleeding. When seen in Oct 2016 she had stopped her Toprol. This was resumed at a reduced dose of 25 mg daily. The question of anticoagulation was brought up. She saw Dr Graciela Husbands in Nov 2016 and he felt she could stay off anticoagulation. He discussed RFA with the pt. Further discussion of this was to be deferred till the following year. She was not seen till this am.  The pt has since stopped both her Lisinopril and her Toprol. This past weekend she had been drinking Saturday night. When she woke up Sunday she felt bad but felt it was a "hangover". Her Fit Bit watch then alarmed during the day for high HR. This persisted and she decided to come to the ED last night where she was found to be in A-flutter with RVR-2:1 conduction with HR 154. She denies any other symptoms. She denies any recreational drug use. She has otherwise been done well since we last saw except for minor illnesses for which she was seen in the ED (no PCP).  In the ED her HR did not come down with IV Lopressor or Diltiazem and she was started in IV Amiodarone after speaking  with the cardiologist on call. She remains in A-flutter this am with a rate of 134. Her B/P is stable and she is tolerating this well.    Past Medical History:  Diagnosis Date  . Allergy   . Bipolar disorder (HCC) 2014  . Cardiomyopathy (HCC)    a. Echo (10/15):  EF 35-40%, mild MR, mild to moderately reduced RV systolic function, PASP 31 mmHg  ; b.  Echo (12/15):  Posterior basal HK, mild LVH, EF 55%, mild LAE.  Marland Kitchen Headache(784.0)   . History of chicken pox   . Hx of migraines   . Hyperlipidemia   . Hypertension   . Infection    trich    Past Surgical History:  Procedure Laterality Date  . ADENOIDECTOMY    . CESAREAN SECTION N/A 01/17/2014   Procedure: CESAREAN SECTION;  Surgeon: Brock Bad, MD;  Location: [facility];  Service: Obstetrics;  Laterality: N/A;  . DILATION AND CURETTAGE OF UTERUS       Medications Prior to Admission: Prior to Admission medications   Medication Sig Start Date End Date Taking? Authorizing Provider  albuterol (PROVENTIL HFA;VENTOLIN HFA) 108 (90 Base) MCG/ACT inhaler Inhale 2 puffs into the lungs every 6 (six) hours as needed for wheezing or shortness of breath (cough, shortness of breath or wheezing.). 08/27/15  Yes Wallis Bamberg, PA-C  aspirin-acetaminophen-caffeine (EXCEDRIN MIGRAINE) [id number] MG tablet Take 2 tablets by mouth  every 6 (six) hours as needed for headache or migraine.   Yes [provider]  levonorgestrel (MIRENA) 20 MCG/24HR IUD 1 each by Intrauterine route once.   Yes [provider]  benzonatate (TESSALON) 100 MG capsule Take 1 capsule (100 mg total) by mouth every 8 (eight) hours. Patient not taking: Reported on 03/22/2017 03/29/16   Renne Crigler, PA-C  ibuprofen (ADVIL,MOTRIN) 600 MG tablet Take 1 tablet (600 mg total) by mouth every 6 (six) hours as needed. Patient not taking: Reported on 03/22/2017 03/29/16   Renne Crigler, PA-C     Allergies:    Allergies  Allergen Reactions  . Lisinopril Swelling    Social  History:   Social History   Social History  . Marital status: Single    Spouse name: N/A  . Number of children: 0  . Years of education: 12+   Occupational History  .  [employer]   Social History Main Topics  . Smoking status: Current Every Day Smoker    Packs/day: 0.50    Years: 5.00    Types: Cigarettes  . Smokeless tobacco: Never Used  . Alcohol use Yes     Comment: socially  . Drug use: No  . Sexual activity: Yes    Birth control/ protection: None     Comment: last intercourse Jul 31 2014   Other Topics Concern  . Not on file   Social History Narrative   Regular exercise-no   Caffeine Use-yes    Family History:   The patient's family history includes Heart attack in her mother; Hypertension in her brother, father, maternal grandfather, maternal grandmother, mother, paternal grandfather, and paternal grandmother.    ROS:  Please see the history of present illness.  All other ROS reviewed and negative.     Physical Exam/Data:   Vitals:   03/23/17 0630 03/23/17 0700 03/23/17 0730 03/23/17 0800  BP: (!) 136/105 (!) 128/105 (!) 138/99 (!) 142/106  Pulse: (!) 134 (!) 135 (!) 131 (!) 135  Resp: (!) 30 (!) 27 20 (!) 21  Temp:      TempSrc:      SpO2: 97% 95% 100% 97%  Weight:      Height:       No intake or output data in the 24 hours ending 03/23/17 0806 Filed Weights   03/22/17 1906  Weight: 230 lb (104.3 kg)   Body mass index is 37.12 kg/m.  General:  Overweight AA female, well developed, in no acute distress HEENT: normal Lymph: no adenopathy Neck: no JVD Endocrine:  Thyroid feels full, non tender Vascular: No carotid bruits; FA pulses 2+ bilaterally without bruits  Cardiac:  Irregularly irregular rhythm with rapid rate Lungs:  clear to auscultation bilaterally, no wheezing, rhonchi or rales  Abd: obese, soft, nontender, no hepatomegaly  Ext: no edema Musculoskeletal:  No deformities, BUE and BLE strength normal and equal Skin: warm and dry   Neuro:  CNs 2-12 intact, no focal abnormalities noted Psych:  Normal affect    EKG:  The ECG that was done 03/22/17 was personally reviewed and demonstrates A-flutter with 2:1 conduction, LVH, rate 154  Laboratory Data:  Chemistry Recent Labs Lab 03/22/17 1912  NA 141  K 3.9  CL 111  CO2 23  GLUCOSE 102*  BUN 12  CREATININE 1.06*  CALCIUM 9.2  GFRNONAA >60  GFRAA >60  ANIONGAP 7    No results for input(s): PROT, ALBUMIN, AST, ALT, ALKPHOS, BILITOT in the last 168 hours. Hematology  Recent Labs Lab 03/22/17 1912  WBC 7.1  RBC 3.93  HGB 13.1  HCT 37.3  MCV 94.9  MCH 33.3  MCHC 35.1  RDW 12.7  PLT 179   Cardiac EnzymesNo results for input(s): TROPONINI in the last 168 hours.  Recent Labs Lab 03/22/17 1935 03/23/17 0026  TROPIPOC 0.02 0.02    BNPNo results for input(s): BNP, PROBNP in the last 168 hours.  DDimer No results for input(s): DDIMER in the last 168 hours.   I-stat HCG quantitative , 5.0  Radiology/Studies:  Dg Chest 2 View  Result Date: 03/22/2017 CLINICAL DATA:  Tachycardia and hypertension EXAM: CHEST  2 VIEW COMPARISON:  03/28/2016 FINDINGS: The heart size and mediastinal contours are stable with borderline cardiomegaly. No aortic aneurysm. Both lungs are clear. The visualized skeletal structures are unremarkable. IMPRESSION: No active cardiopulmonary disease. Electronically Signed   By: Tollie Eth M.D.   On: 03/22/2017 20:19    Assessment and Plan:   Recurrent atrial flutter with RVR- Rate marginally controlled with IV Amiodarone. Will add Diltiazem IV and start po beta blocker. Fortunately pt is tolerating her rate well and her B/P is stable.  Tachycardia induced CM- In Oct 2015 her EF was 35% in A-flutter, this normalized by Dec 2015  Anticoagulated- Heparin started. CHA2DS2 VASc= 2 for sex, HTN. She was taken off Xarelto June 2016 secondary to vaginal bleeding.  HTN- She had been on Lisinopril 20 mg in the past but was not taking  anything prior to this admission "ran out"  Obesity- BMI 37  Plan: Discussed with Dr Duke Salvia. Admit to stepdown. Continue IV Amiodarone and Heparin. Resume IV Diltiazem for rate control (stopped overnight in ED). Add PO beta blocker. Check TSH and serum Mg++. Will try and arrange for TEE CV tomorrow (pt ate full breakfast this am).   After careful review of history and examination, the risks and benefits of transesophageal echocardiogram have been explained including risks of esophageal damage, perforation (1:10,000 risk), bleeding, pharyngeal hematoma as well as other potential complications associated with conscious sedation including aspiration, arrhythmia, respiratory failure and death. Alternatives to treatment were discussed, questions were answered. Patient is willing to proceed.   Corine Shelter PA-C 03/23/2017 8:48 AM   Severity of Illness: The appropriate patient status for this patient is OBSERVATION. Observation status is judged to be reasonable and necessary in order to provide the required intensity of service to ensure the patient's safety. The patient's presenting symptoms, physical exam findings, and initial radiographic and laboratory data in the context of their medical condition is felt to place them at decreased risk for further clinical deterioration. Furthermore, it is anticipated that the patient will be medically stable for discharge from the hospital within 2 midnights of admission. The following factors support the patient status of observation.   " The patient's presenting symptoms include tachycardia. " The physical exam findings include tachycardia. " The initial radiographic and laboratory data are EKG shows atrial flutter with RVR.     Jolene Provost, PA-C  03/23/2017 8:06 AM

## 2017-03-23 NOTE — ED Provider Notes (Signed)
  Physical Exam  BP (!) 132/115   Pulse (!) 132   Temp 98.8 F (37.1 C) (Oral)   Resp 18   Ht 5\' 6"  (1.676 m)   Wt 104.3 kg (230 lb)   SpO2 99%   BMI 37.12 kg/m   Physical Exam  ED Course  Procedures  MDM Cardiology saw patient. Will continue heparin drip, amiodarone drip. Will admit to [facility] for TTE cardioversion    Charlynne Pander, MD 03/23/17 [phone]

## 2017-03-23 NOTE — ED Notes (Signed)
Cardiology at bedside.

## 2017-03-24 ENCOUNTER — Encounter (HOSPITAL_COMMUNITY): Payer: Self-pay | Admitting: Anesthesiology

## 2017-03-24 ENCOUNTER — Encounter (HOSPITAL_COMMUNITY)
Admission: EM | Disposition: A | Discharge status: Home / Self Care | Payer: Self-pay | Source: Home / Self Care | Attending: Emergency Medicine

## 2017-03-24 ENCOUNTER — Observation Stay (HOSPITAL_COMMUNITY): Payer: Medicaid Other

## 2017-03-24 ENCOUNTER — Ambulatory Visit (HOSPITAL_COMMUNITY): Admission: RE | Admit: 2017-03-24 | Payer: 59 | Source: Ambulatory Visit | Admitting: Cardiology

## 2017-03-24 DIAGNOSIS — I10 Essential (primary) hypertension: Secondary | ICD-10-CM | POA: Diagnosis not present

## 2017-03-24 DIAGNOSIS — I4892 Unspecified atrial flutter: Secondary | ICD-10-CM | POA: Diagnosis not present

## 2017-03-24 DIAGNOSIS — Z79899 Other long term (current) drug therapy: Secondary | ICD-10-CM | POA: Diagnosis not present

## 2017-03-24 DIAGNOSIS — Z7901 Long term (current) use of anticoagulants: Secondary | ICD-10-CM

## 2017-03-24 DIAGNOSIS — I420 Dilated cardiomyopathy: Secondary | ICD-10-CM | POA: Diagnosis not present

## 2017-03-24 LAB — BASIC METABOLIC PANEL
Anion gap: 7 (ref 5–15)
BUN: 11 mg/dL (ref 6–20)
CO2: 20 mmol/L — ABNORMAL LOW (ref 22–32)
Calcium: 8.7 mg/dL — ABNORMAL LOW (ref 8.9–10.3)
Chloride: 111 mmol/L (ref 101–111)
Creatinine, Ser: 0.93 mg/dL (ref 0.44–1.00)
GFR calc Af Amer: >60 mL/min (ref >60)
GFR calc non Af Amer: >60 mL/min (ref >60)
Glucose, Bld: 100 mg/dL — ABNORMAL HIGH (ref 65–99)
Potassium: 3.9 mmol/L (ref 3.5–5.1)
Sodium: 138 mmol/L (ref 135–145)

## 2017-03-24 LAB — HEPARIN LEVEL (UNFRACTIONATED)
HEPARIN UNFRACTIONATED: 0.28 [IU]/mL — AB (ref 0.30–0.70)
Heparin Unfractionated: 0.5 IU/mL (ref 0.30–0.70)

## 2017-03-24 LAB — CBC
HEMATOCRIT: 35.1 % — AB (ref 36.0–46.0)
Hemoglobin: 11.7 g/dL — ABNORMAL LOW (ref 12.0–15.0)
MCH: 31.5 pg (ref 26.0–34.0)
MCHC: 33.3 g/dL (ref 30.0–36.0)
MCV: 94.6 fL (ref 78.0–100.0)
PLATELETS: 147 10*3/uL — AB (ref 150–400)
RBC: 3.71 MIL/uL — AB (ref 3.87–5.11)
RDW: 13 % (ref 11.5–15.5)
WBC: 5.8 10*3/uL (ref 4.0–10.5)

## 2017-03-24 LAB — HIV ANTIBODY (ROUTINE TESTING W REFLEX): HIV Screen 4th Generation wRfx: NONREACTIVE

## 2017-03-24 LAB — MAGNESIUM: Magnesium: 1.9 mg/dL (ref 1.7–2.4)

## 2017-03-24 LAB — TSH: TSH: 2.442 u[IU]/mL (ref 0.350–4.500)

## 2017-03-24 SURGERY — CANCELLED PROCEDURE

## 2017-03-24 MED ORDER — APIXABAN 5 MG PO TABS: 5.0000 mg | ORAL_TABLET | Freq: Two times a day (BID) | ORAL | 3 refills | Status: DC

## 2017-03-24 MED ORDER — AMIODARONE HCL 200 MG PO TABS: 200.0000 mg | ORAL_TABLET | Freq: Two times a day (BID) | ORAL | 0 refills | Status: DC

## 2017-03-24 MED ORDER — TRAMADOL HCL 50 MG PO TABS: 50.0000 mg | ORAL_TABLET | Freq: Four times a day (QID) | ORAL | 0 refills | Status: DC | PRN

## 2017-03-24 MED ORDER — DILTIAZEM HCL ER COATED BEADS 120 MG PO CP24
120.0000 mg | ORAL_CAPSULE | Freq: Every day | ORAL | Status: DC
  Administered 2017-03-24: 120 mg via ORAL
  Filled 2017-03-24: qty 1

## 2017-03-24 MED ORDER — BUTALBITAL-APAP-CAFFEINE 50-325-40 MG PO TABS
1.0000 | ORAL_TABLET | Freq: Four times a day (QID) | ORAL | Status: DC | PRN
  Administered 2017-03-24: 1 via ORAL
  Filled 2017-03-24: qty 1

## 2017-03-24 MED ORDER — METOPROLOL TARTRATE 25 MG PO TABS: 25.0000 mg | ORAL_TABLET | Freq: Two times a day (BID) | ORAL | 3 refills | Status: DC

## 2017-03-24 MED ORDER — AMIODARONE HCL 200 MG PO TABS: 200.0000 mg | ORAL_TABLET | Freq: Every day | ORAL | 5 refills | Status: DC

## 2017-03-24 MED ORDER — TRAMADOL HCL 50 MG PO TABS: 50.0000 mg | ORAL_TABLET | Freq: Four times a day (QID) | ORAL | Status: DC | PRN

## 2017-03-24 MED ORDER — AMIODARONE HCL 200 MG PO TABS
200.0000 mg | ORAL_TABLET | Freq: Two times a day (BID) | ORAL | Status: DC
  Administered 2017-03-24: 200 mg via ORAL
  Filled 2017-03-24: qty 1

## 2017-03-24 MED ORDER — ACETAMINOPHEN 325 MG PO TABS: 650.0000 mg | ORAL_TABLET | Freq: Four times a day (QID) | ORAL | Status: DC | PRN

## 2017-03-24 MED ORDER — APIXABAN 5 MG PO TABS
5.0000 mg | ORAL_TABLET | Freq: Two times a day (BID) | ORAL | Status: DC
  Administered 2017-03-24: 5 mg via ORAL
  Filled 2017-03-24: qty 1

## 2017-03-24 MED ORDER — HEPARIN (PORCINE) IN NACL 100-0.45 UNIT/ML-% IJ SOLN: 1400.0000 [IU]/h | INTRAMUSCULAR | Status: DC

## 2017-03-24 MED ORDER — DILTIAZEM HCL ER COATED BEADS 120 MG PO CP24: 120.0000 mg | ORAL_CAPSULE | Freq: Every day | ORAL | 3 refills | Status: DC

## 2017-03-24 NOTE — Progress Notes (Signed)
Progress Note  Patient Name: Tracy Grant Date of Encounter: 03/24/2017  Primary Cardiologist: Dr Graciela Husbands  Subjective   C/O headache.  Inpatient Medications    Scheduled Meds: . amiodarone  200 mg Oral BID  . aspirin EC  81 mg Oral Daily  . diltiazem  120 mg Oral Daily  . metoprolol tartrate  25 mg Oral BID   Continuous Infusions: . heparin 1,400 Units/hr (03/24/17 0600)   PRN Meds: acetaminophen, ALPRAZolam, hydrALAZINE, ondansetron (ZOFRAN) IV, zolpidem   Vital Signs    Vitals:   03/24/17 0500 03/24/17 0600 03/24/17 0700 03/24/17 0800  BP: 127/81 125/78 125/84 (!) 144/98  Pulse: 63 65 (!) 58 67  Resp: 17 (!) 24 12 18   Temp:    98.4 F (36.9 C)  TempSrc:    Oral  SpO2: 100% 100% 100% 100%  Weight:      Height:        Intake/Output Summary (Last 24 hours) at 03/24/17 0936 Last data filed at 03/24/17 0720  Gross per 24 hour  Intake           585.23 ml  Output             1600 ml  Net         -1014.77 ml   Filed Weights   03/22/17 1906 03/23/17 1310 03/24/17 0407  Weight: 230 lb (104.3 kg) 230 lb 2.6 oz (104.4 kg) 235 lb 0.2 oz (106.6 kg)    Telemetry    NSR, PACs- Personally Reviewed  ECG    03/24/17- NSR, she has a short PR interval but I don't see a delta wave- ? LGL will review with MD- Personally Reviewed  Physical Exam   GEN: No acute distress.   Neck: No JVD Cardiac: RRR, no murmurs, rubs, or gallops.  Respiratory: Clear to auscultation bilaterally. GI: Soft, nontender, non-distended  MS: No edema; No deformity. Neuro:  Nonfocal  Psych: Normal affect   Labs    Chemistry Recent Labs Lab 03/22/17 1912 03/24/17 0258  NA 141 138  K 3.9 3.9  CL 111 111  CO2 23 20*  GLUCOSE 102* 100*  BUN 12 11  CREATININE 1.06* 0.93  CALCIUM 9.2 8.7*  GFRNONAA >60 >60  GFRAA >60 >60  ANIONGAP 7 7     Hematology Recent Labs Lab 03/22/17 1912 03/24/17 0258  WBC 7.1 5.8  RBC 3.93 3.71*  HGB 13.1 11.7*  HCT 37.3 35.1*  MCV 94.9 94.6    MCH 33.3 31.5  MCHC 35.1 33.3  RDW 12.7 13.0  PLT 179 147*    Cardiac EnzymesNo results for input(s): TROPONINI in the last 168 hours.  Recent Labs Lab 03/22/17 1935 03/23/17 0026  TROPIPOC 0.02 0.02     BNPNo results for input(s): BNP, PROBNP in the last 168 hours.   DDimer No results for input(s): DDIMER in the last 168 hours.   Radiology    Dg Chest 2 View  Result Date: 03/22/2017 CLINICAL DATA:  Tachycardia and hypertension EXAM: CHEST  2 VIEW COMPARISON:  03/28/2016 FINDINGS: The heart size and mediastinal contours are stable with borderline cardiomegaly. No aortic aneurysm. Both lungs are clear. The visualized skeletal structures are unremarkable. IMPRESSION: No active cardiopulmonary disease. Electronically Signed   By: Tollie Eth M.D.   On: 03/22/2017 20:19    Cardiac Studies   Echo ordered  Patient Profile     31 y.o. female with a history of atrial flutter and rate related CM, admitted 03/22/17  with A flutter with RVR (off all medications). She has converted with IV Diltiazem, po Lopressor, and IV Amiodarone.  Assessment & Plan    Recurrent atrial flutter with RVR- She converted to NSR yesterday PM. Short PR- ? LGL TEE CV cancelled  Tachycardia induced CM- In Oct 2015 her EF was 35% in A-flutter, this normalized by Dec 2015. Echo pending  Anticoagulated- Heparin started. CHA2DS2 VASc= 2 for sex, HTN. She was taken off Xarelto June 2016 secondary to vaginal bleeding.  HTN- She had been on Lisinopril 20 mg in the past but was not taking anything prior to this admission "ran out"  Non compliance with medications- Will discuss the importance of medication compliance with her.  Plan: I will ask EP to review. Doubt Amiodarone is a long term option. I have changed this to po till I hear from them. I also asked pharmacy to start Xarelto and stop heparin and changed Diltiazem to PO 120 mg.   Note: I spoke with pharmacy who says there is a reported interaction  with Diltiazem and Xarelto which may increase the chance of bleeding. Since she had vaginal bleeding in the past on Xarelto will change to Eliquis per pharmacy recomendation.   Jolene Provost, PA-C  03/24/2017, 9:36 AM

## 2017-03-24 NOTE — Discharge Instructions (Signed)

## 2017-03-24 NOTE — Discharge Summary (Signed)
Patient ID: Tracy Grant,  MRN: [id number], DOB/AGE: 29-31-1987 29 y.o.  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 Her EF improved from 35% in Oct 2015 to 55% in Dec 2015. She has a history of HTN and was felt to be a CHA2DS2 VASc=2. She was initially placed on Toprol 100 mg and Xarelto, in addition to Lisinopril which she took for HTN. Her Xarelto was stopped in June 2016 secondary to vaginal bleeding. When seen in Oct 2016 she had stopped her Toprol. This was resumed at a reduced dose of 25 mg daily. The question of anticoagulation was brought up. She saw Dr Graciela Husbands in Nov 2016 and he felt she could stay off anticoagulation. He discussed RFA with the pt. Further discussion of this was to be deferred till the following year. She was not seen till this am.  The pt has since stopped both her Lisinopril and her Toprol. The weekend prior to this admission she had been drinking Saturday night. When she woke up Sunday she felt bad but felt it was a "hangover". Her Fit Bit watch then alarmed during the day for high HR. This persisted and she decided to come to the ED 03/22/17 where she was found to be in A-flutter with RVR-2:1 conduction with HR 154. The pt was admitted and placed on Amiodarone, IV Diltiazem,and heparin. Despite her HR being 135 she tolerated this well. Later on the 8th she converted to NSR. She was noted to have a short PR interval but no delta wave. An echo was done,  results are pending at the time of discharge. Dr Duke Salvia saw her that afternoon of the 9 th and felt she could be discharged. The plan is for EP follow up. Eliquis was started (pharmacy notes possible interaction of Diltiazem and Xarelto which can increase bleeding time and she had prior vaginal bleeding causing her to stop Xarelto in 2016. She'll take Amiodarone 200 mg BID x 10 days, then decrease to 200 mg daily, long term plans for Amiodarone per EP.    Discharge Vitals:  Blood pressure 128/76, pulse 67, temperature 98.4 F (36.9 C), temperature source Oral, resp. rate 18, height 5\' 6"  (1.676 m), weight 235 lb 0.2 oz (106.6 kg), SpO2 100 %.    Labs: Results for orders placed or performed during the hospital encounter of 03/22/17 (from the past 24 hour(s))  Heparin level (unfractionated)     Status: Abnormal   Collection Time: 03/23/17  3:43 PM  Result Value Ref Range   Heparin Unfractionated 0.24 (L) 0.30 - 0.70 IU/mL  Heparin level (unfractionated)     Status: Abnormal   Collection Time: 03/24/17 12:30 AM  Result Value Ref Range   Heparin Unfractionated 0.28 (L) 0.30 - 0.70 IU/mL  CBC     Status: Abnormal   Collection Time: 03/24/17  2:58 AM  Result Value Ref Range   WBC 5.8 4.0 - 10.5 K/uL   RBC 3.71 (L) 3.87 - 5.11 MIL/uL   Hemoglobin 11.7 (L) 12.0 -  15.0 g/dL   HCT 16.1 (L) 09.6 - 04.5 %   MCV 94.6 78.0 - 100.0 fL   MCH 31.5 26.0 - 34.0 pg   MCHC 33.3 30.0 - 36.0 g/dL   RDW 40.9 81.1 - 91.4 %   Platelets 147 (L) 150 - 400 K/uL  TSH     Status: None   Collection Time: 03/24/17  2:58 AM  Result Value Ref Range   TSH 2.442 0.350 - 4.500 uIU/mL  Magnesium     Status: None   Collection Time: 03/24/17  2:58 AM  Result Value Ref Range   Magnesium 1.9 1.7 - 2.4 mg/dL  Basic metabolic panel     Status: Abnormal   Collection Time: 03/24/17  2:58 AM  Result Value Ref Range   Sodium 138 135 - 145 mmol/L   Potassium 3.9 3.5 - 5.1 mmol/L   Chloride 111 101 - 111 mmol/L   CO2 20  (L) 22 - 32 mmol/L   Glucose, Bld 100 (H) 65 - 99 mg/dL   BUN 11 6 - 20 mg/dL   Creatinine, Ser 7.82 0.44 - 1.00 mg/dL   Calcium 8.7 (L) 8.9 - 10.3 mg/dL   GFR calc non Af Amer >60 >60 mL/min   GFR calc Af Amer >60 >60 mL/min   Anion gap 7 5 - 15  Heparin level (unfractionated)     Status: None   Collection Time: 03/24/17  8:01 AM  Result Value Ref Range   Heparin Unfractionated 0.50 0.30 - 0.70 IU/mL    Disposition:  Follow-up Information    Duke Salvia, MD Follow up.   Specialty:  Cardiology Why:  office will contact you Contact information: [address] [place] [address] [phone]           Discharge Medications:  Allergies as of 03/24/2017      Reactions   Lisinopril Swelling      Medication List    STOP taking these medications   benzonatate 100 MG capsule Commonly known as:  TESSALON   ibuprofen 600 MG tablet Commonly known as:  ADVIL,MOTRIN     TAKE these medications   acetaminophen 325 MG tablet Commonly known as:  TYLENOL Take 2 tablets (650 mg total) by mouth every 6 (six) hours as needed for mild pain or headache.   albuterol 108 (90 Base) MCG/ACT inhaler Commonly known as:  PROVENTIL HFA;VENTOLIN HFA Inhale 2 puffs into the lungs every 6 (six) hours as needed for wheezing or shortness of breath (cough, shortness of breath or wheezing.).   amiodarone 200 MG tablet Commonly known as:  PACERONE Take 1 tablet (200 mg total) by mouth 2 (two) times daily.   amiodarone 200 MG tablet Commonly known as:  PACERONE Take 1 tablet (200 mg total) by mouth daily. Start taking on:  04/04/2017   apixaban 5 MG Tabs tablet Commonly known as:  ELIQUIS Take 1 tablet (5 mg total) by mouth 2 (two) times daily.   aspirin-acetaminophen-caffeine 250-250-65 MG tablet Commonly known as:  EXCEDRIN MIGRAINE Take 2 tablets by mouth every 6 (six) hours as needed for headache or migraine.   diltiazem 120 MG 24 hr capsule Commonly known as:   CARDIZEM CD Take 1 capsule (120 mg total) by mouth daily.   levonorgestrel 20 MCG/24HR IUD Commonly known as:  MIRENA 1 each by Intrauterine route once.   metoprolol tartrate 25 MG tablet Commonly known as:  LOPRESSOR Take 1 tablet (25 mg total) by mouth  2 (two) times daily.   traMADol 50 MG tablet Commonly known as:  ULTRAM Take 1 tablet (50 mg total) by mouth every 6 (six) hours as needed for moderate pain (if headache not relieved with Fiorcet).        Duration of Discharge Encounter: Greater than 30 minutes including physician time.  Jolene Provost PA-C 03/24/2017 2:37 PM

## 2017-03-24 NOTE — Progress Notes (Addendum)
ANTICOAGULATION CONSULT NOTE - follow-up  Pharmacy Consult for IV heparin Indication: atrial fibrillation  Allergies  Allergen Reactions  . Lisinopril Swelling    Patient Measurements: Height: 5\' 6"  (167.6 cm) Weight: 235 lb 0.2 oz (106.6 kg) IBW/kg (Calculated) : 59.3 Heparin Dosing Weight: 72 kg  Vital Signs: Temp: 98.4 F (36.9 C) (08/09 0800) Temp Source: Oral (08/09 0800) BP: 144/98 (08/09 0800) Pulse Rate: 67 (08/09 0800)  Labs:  Recent Labs  03/22/17 1912 03/22/17 1942  03/23/17 1543 03/24/17 0030 03/24/17 0258 03/24/17 0801  HGB 13.1  --   --   --   --  11.7*  --   HCT 37.3  --   --   --   --  35.1*  --   PLT 179  --   --   --   --  147*  --   APTT  --  33  --   --   --   --   --   LABPROT  --  13.3  --   --   --   --   --   INR  --  1.01  --   --   --   --   --   HEPARINUNFRC  --   --   < > 0.24* 0.28*  --  0.50  CREATININE 1.06*  --   --   --   --  0.93  --   < > = values in this interval not displayed.  Estimated Creatinine Clearance: 111.2 mL/min (by C-G formula based on SCr of 0.93 mg/dL).   Assessment: 30 yoF presenting in a-flutter. CHA2DS2 VASc= 2.  IV heparin per Rx  Today, 03/24/2017  Heparin level 0.5, therapeutic with heparin at 1400 units/hr  CBC: Hgb decreased slightly to 11.7, pltc decreased 147  Plan for TEE and DCCV 8/9  Per cardiology note, on xarelto in past but stopped in 2016 d/t vaginal bleeding   Goal of Therapy:  Heparin level 0.3-0.7 units/ml Monitor platelets by anticoagulation protocol: Yes   Plan:   Continue heparin drip at 1400 units/hr  Recheck heparin level in 6 hours  Daily CBC and heparin level  Monitor for bleeding  Await long-term plan for anticoagulation  Loralee Pacas, PharmD, BCPS Pager: [phone] 03/24/2017 9:26 AM  Addendum: Consulted to transition IV heparin to Eliquis.  - Stop IV heparin, then in 1 hour begin Eliquis 5mg  PO BID.  - Monitor renal function and CBC - Monitor closely for  signs/symptoms of bleeding/thrombosis. - Provide education to patient  Loralee Pacas, PharmD, BCPS 03/24/2017 10:09 AM

## 2017-03-24 NOTE — Progress Notes (Signed)
ANTICOAGULATION CONSULT NOTE - follow-up  Pharmacy Consult for IV heparin Indication: atrial fibrillation  Allergies  Allergen Reactions  . Lisinopril Swelling    Patient Measurements: Height: 5\' 6"  (167.6 cm) Weight: 230 lb 2.6 oz (104.4 kg) IBW/kg (Calculated) : 59.3 Heparin Dosing Weight: 72 kg  Vital Signs: Temp: 98.3 F (36.8 C) (08/08 2355) Temp Source: Oral (08/08 2355) BP: 130/79 (08/09 0000) Pulse Rate: 69 (08/09 0000)  Labs:  Recent Labs  03/22/17 1912 03/22/17 1942 03/23/17 0914 03/23/17 1543 03/24/17 0030  HGB 13.1  --   --   --   --   HCT 37.3  --   --   --   --   PLT 179  --   --   --   --   APTT  --  33  --   --   --   LABPROT  --  13.3  --   --   --   INR  --  1.01  --   --   --   HEPARINUNFRC  --   --  0.33 0.24* 0.28*  CREATININE 1.06*  --   --   --   --     Estimated Creatinine Clearance: 96.4 mL/min (A) (by C-G formula based on SCr of 1.06 mg/dL (H)).   Assessment: 30 yoF presenting in a-flutter. CHA2DS2 VASc= 2.  IV heparin per Rx  8/8  Heparin level is low at 0.24 with heparin at 1100 units/hr  CBC: Hgb and pltc WNL  Plan for TEE and DCCV 8/9  Per cardiology note, on xarelto in past but stopped in 2016 d/t vaginal bleeding  Today, 8/9 0030 HL=0.28 below goal, no infusion or bleeding issues per RN  Goal of Therapy:  Heparin level 0.3-0.7 units/ml Monitor platelets by anticoagulation protocol: Yes   Plan:   Increase heparin drip to 1400 units/hr  Recheck heparin level in 6 hours  Daily CBC and heparin level  Monitor for bleeding  Await long-term plan for anticoagulation  Lorenza Evangelist 03/24/2017 1:23 AM

## 2017-03-24 NOTE — Care Management Note (Signed)
Case Management Note  Patient Details  Name: Tracy Grant MRN: [id number] Date of Birth: 1985/10/10  Subjective/Objective:       A/fib  Requiring iv meds for rate control       Action/Plan: Date:  March 24, 2017 Chart reviewed for concurrent status and case management needs. Will continue to follow patient progress. Discharge Planning: following for needs Expected discharge date: [id number] Marcelle Smiling, BSN, [place], [place]   [phone]  Expected Discharge Date:   (unknown)               Expected Discharge Plan:  Home/Self Care  In-House Referral:  NA  Discharge planning Services  CM Consult  Post Acute Care Choice:  NA Choice offered to:     DME Arranged:    DME Agency:     HH Arranged:    HH Agency:     Status of Service:  In process, will continue to follow  If discussed at Long Length of Stay Meetings, dates discussed:    Additional Comments:  Golda Acre, RN 03/24/2017, 8:19 AM

## 2017-03-24 NOTE — Progress Notes (Signed)
[id number]/KZSWFU Davis,BSN,RN3,CCM:  Medication assistance through rx cards and [facility] pharmacy performed for patient.

## 2017-04-04 ENCOUNTER — Telehealth (HOSPITAL_COMMUNITY): Payer: Self-pay | Admitting: *Deleted

## 2017-04-04 NOTE — Telephone Encounter (Signed)
LMOM for pt to clbk to sched 

## 2017-04-04 NOTE — Telephone Encounter (Signed)
-----   Message from Danton Clap sent at 03/31/2017 12:50 PM EDT ----- [place] over to you: 8-10 no answer-no voicemail 259pm 8-16 no answer-no voicemail 1248pm  Thanks, Melissa ----- Message ----- From: Abelino Derrick, PA-C Sent: 03/24/2017   2:08 PM To: Mickie Bail [address] Scheduling  This pt needs to be seen in the AF clinic by Dr Graciela Husbands or an APP in 1-2 weeks.   Thanks,  Corine Shelter PA-C 03/24/2017 2:09 PM

## 2017-04-07 ENCOUNTER — Encounter (HOSPITAL_COMMUNITY): Payer: Self-pay | Admitting: *Deleted

## 2017-04-07 NOTE — Telephone Encounter (Signed)
Letter sent.

## 2017-04-09 ENCOUNTER — Other Ambulatory Visit: Payer: Self-pay | Admitting: Cardiology

## 2017-04-11 NOTE — Telephone Encounter (Signed)
Pt has not been seen since 06/2015. Pt still does not have an appointment. Would you like to refill this medication? Please advise

## 2017-04-13 NOTE — Telephone Encounter (Signed)
This was recently started in the hospital. She should now be on amiodarone 200 mg once daily.  Ok to refill for amiodarone 200 mg once daily # 30 w/ 1 refill. Tracy Grant has been trying to contact her for follow up. Please add notes to pharmacy that the patient will need an office visit for further refills.   Thanks!

## 2017-05-10 ENCOUNTER — Ambulatory Visit (HOSPITAL_COMMUNITY)
Admission: RE | Admit: 2017-05-10 | Discharge: 2017-05-10 | Disposition: A | Discharge status: Home / Self Care | Payer: Medicaid Other | Source: Ambulatory Visit | Attending: Nurse Practitioner | Admitting: Nurse Practitioner

## 2017-05-10 ENCOUNTER — Encounter (HOSPITAL_COMMUNITY): Payer: Self-pay | Admitting: Nurse Practitioner

## 2017-05-10 VITALS — BP 164/102 | HR 66 | Ht 66.0 in | Wt 226.4 lb

## 2017-05-10 DIAGNOSIS — F319 Bipolar disorder, unspecified: Secondary | ICD-10-CM | POA: Diagnosis not present

## 2017-05-10 DIAGNOSIS — I429 Cardiomyopathy, unspecified: Secondary | ICD-10-CM | POA: Diagnosis not present

## 2017-05-10 DIAGNOSIS — I4892 Unspecified atrial flutter: Secondary | ICD-10-CM | POA: Diagnosis present

## 2017-05-10 DIAGNOSIS — F17210 Nicotine dependence, cigarettes, uncomplicated: Secondary | ICD-10-CM | POA: Insufficient documentation

## 2017-05-10 DIAGNOSIS — I10 Essential (primary) hypertension: Secondary | ICD-10-CM | POA: Insufficient documentation

## 2017-05-10 DIAGNOSIS — E785 Hyperlipidemia, unspecified: Secondary | ICD-10-CM | POA: Insufficient documentation

## 2017-05-10 MED ORDER — DILTIAZEM HCL ER COATED BEADS 120 MG PO CP24: 120.0000 mg | ORAL_CAPSULE | Freq: Two times a day (BID) | ORAL | 6 refills | Status: DC

## 2017-05-10 NOTE — Patient Instructions (Signed)
Your physician has recommended you make the following change in your medication:  1)Increase cardizem to 120mg  twice a day  Call Hiren Peplinski once you have talked with the financial counselor. [phone]

## 2017-05-11 NOTE — Progress Notes (Signed)
Primary Care Physician: Patient, No Pcp Per Referring Physician:   [name] is a 31 y.o. female with a h/o atrial flutter, first diagnosed in the fall of 2015, few months after the birth of her first child. She presented with symptoms of heart failure. She had EF initially of 35-40%, which did later normalize.  She was seen by Dr. Graciela Grant in 2016 and was suppose to f/u but never did. Atrial flutter ablation was discussed.  She presented to the ER recently with typical atrial flutter. History of HTN, tobacco abuse, morbid obesity. She had previously been on lisinopril,Toprol and had stopped meds. She said she thinks the trigger  For atrial flutter, was that she was at a family reunion and got "wasted" drinking excessive alcohol. She often drinks Monster drinks and adds a powder mixture to her water that is for "energy".She does snore, has not had a sleep study. Needs updated echo but is out of work. I had SW to come in and someone form [facility] will contact her to get Medicaid.  Blood pressure is very elevated today.  In the ER she was placed on amiodarone drip and was suppose to go home on amiodarone but never started it. She has been staying in SR. She was placed back on eliquis 5 mg bid for chadsvasc score of 3(female, htn, previous LV dysfunction)  Today, she denies symptoms of palpitations, chest pain, shortness of breath, orthopnea, PND, lower extremity edema, dizziness, presyncope, syncope, or neurologic sequela. The patient is tolerating medications without difficulties and is otherwise without complaint today.   Past Medical History:  Diagnosis Date  . Allergy   . Bipolar disorder (HCC) 2014  . Cardiomyopathy (HCC)    a. Echo (10/15):  EF 35-40%, mild MR, mild to moderately reduced RV systolic function, PASP 31 mmHg  ; b.  Echo (12/15):  Posterior basal HK, mild LVH, EF 55%, mild LAE.  Marland Kitchen Headache(784.0)   . History of chicken pox   . Hx of migraines   . Hyperlipidemia   .  Hypertension   . Infection    trich   Past Surgical History:  Procedure Laterality Date  . ADENOIDECTOMY    . CESAREAN SECTION N/A 01/17/2014   Procedure: CESAREAN SECTION;  Surgeon: Brock Bad, MD;  Location: [facility];  Service: Obstetrics;  Laterality: N/A;  . DILATION AND CURETTAGE OF UTERUS      Current Outpatient Prescriptions  Medication Sig Dispense Refill  . acetaminophen (TYLENOL) 325 MG tablet Take 2 tablets (650 mg total) by mouth every 6 (six) hours as needed for mild pain or headache.    . albuterol (PROVENTIL HFA;VENTOLIN HFA) 108 (90 Base) MCG/ACT inhaler Inhale 2 puffs into the lungs every 6 (six) hours as needed for wheezing or shortness of breath (cough, shortness of breath or wheezing.). 1 Inhaler 1  . apixaban (ELIQUIS) 5 MG TABS tablet Take 1 tablet (5 mg total) by mouth 2 (two) times daily. 180 tablet 3  . diltiazem (CARDIZEM CD) 120 MG 24 hr capsule Take 1 capsule (120 mg total) by mouth 2 (two) times daily. 60 capsule 6  . levonorgestrel (MIRENA) 20 MCG/24HR IUD 1 each by Intrauterine route once.    . metoprolol tartrate (LOPRESSOR) 25 MG tablet Take 1 tablet (25 mg total) by mouth 2 (two) times daily. 180 tablet 3  . traMADol (ULTRAM) 50 MG tablet Take 1 tablet (50 mg total) by mouth every 6 (six) hours as needed for moderate pain (if  headache not relieved with Fiorcet). 15 tablet 0   No current facility-administered medications for this encounter.     Allergies  Allergen Reactions  . Lisinopril Swelling    Social History   Social History  . Marital status: Single    Spouse name: N/A  . Number of children: 0  . Years of education: 12+   Occupational History  .  [employer]   Social History Main Topics  . Smoking status: Current Every Day Smoker    Packs/day: 0.50    Years: 5.00    Types: Cigarettes  . Smokeless tobacco: Never Used  . Alcohol use Yes     Comment: socially  . Drug use: No  . Sexual activity: Yes    Birth control/  protection: None     Comment: last intercourse Jul 31 2014   Other Topics Concern  . Not on file   Social History Narrative   Regular exercise-no   Caffeine Use-yes    Family History  Problem Relation Age of Onset  . Hypertension Mother   . Heart attack Mother        received stent in her early 10s  . Hypertension Father   . Hypertension Brother   . Hypertension Maternal Grandmother   . Hypertension Maternal Grandfather   . Hypertension Paternal Grandmother   . Hypertension Paternal Grandfather     ROS- All systems are reviewed and negative except as per the HPI above  Physical Exam: Vitals:   05/10/17 1528  BP: (!) 164/102  Pulse: 66  Weight: 226 lb 6.4 oz (102.7 kg)  Height:  (1.676 m)   Wt Readings from Last 3 Encounters:  05/10/17 226 lb 6.4 oz (102.7 kg)  03/24/17 235 lb 0.2 oz (106.6 kg)  03/28/16 221 lb 8 oz (100.5 kg)    Labs: Lab Results  Component Value Date   NA 138 03/24/2017   K 3.9 03/24/2017   CL 111 03/24/2017   CO2 20 (L) 03/24/2017   GLUCOSE 100 (H) 03/24/2017   BUN 11 03/24/2017   CREATININE 0.93 03/24/2017   CALCIUM 8.7 (L) 03/24/2017   MG 1.9 03/24/2017   Lab Results  Component Value Date   INR 1.01 03/22/2017   Lab Results  Component Value Date   CHOL 156 08/08/2012   HDL 34.90 (L) 08/08/2012   LDLCALC 82 08/08/2012   TRIG 198.0 (H) 08/08/2012     GEN- The patient is well appearing, alert and oriented x 3 today.   Head- normocephalic, atraumatic Eyes-  Sclera clear, conjunctiva pink Ears- hearing intact Oropharynx- clear Neck- supple, no JVP Lymph- no cervical lymphadenopathy Lungs- Clear to ausculation bilaterally, normal work of breathing Heart- Regular rate and rhythm, no murmurs, rubs or gallops, PMI not laterally displaced GI- soft, NT, ND, + BS Extremities- no clubbing, cyanosis, or edema MS- no significant deformity or atrophy Skin- no rash or lesion Psych- euthymic mood, full affect Neuro- strength and  sensation are intact  EKG-Sinus rhythm with short PR, rate 66 bpm, PR int 98 ms, qrs 102 ms, qtc 429 ms,  t wave abnormality, consider inferior ischemia Epic records reviewed   Assessment and Plan: 1. Atrial flutter General education re flutter, Lifestyle and triggers She was asked to stop smoking Advised not to use energy drinks or supplements for energy  High caffeine intake and will cut back on this Needs a sleep study and an echo but pt currently without insurance SW in to discuss with pt and  someone from [facility] is call her to try to get her Medicaid Regular exercise and weight loss also discussed Continue Toprol, Cardizem and Eliquis for chadsvasc score of 3 Hold off on amiodarone for now as she never did not start and has been maintaining SR  2. HTN Avoid salt Increase Cardizem to 120 mg bid Took lisinopril in the past but now is listed under allergy for "swelling"  F/u in one week for BP/HR check but plan to get above tests and refer back to Dr. Graciela Grant when Medicaid is established to f/u up on atrial flutter ablation  Lupita Leash C[facility] [address] [place], [place] [address] [phone]

## 2017-08-05 ENCOUNTER — Ambulatory Visit: Payer: Self-pay | Admitting: Internal Medicine

## 2017-08-10 ENCOUNTER — Encounter: Payer: Self-pay | Admitting: Internal Medicine

## 2018-05-22 DIAGNOSIS — N760 Acute vaginitis: Secondary | ICD-10-CM | POA: Diagnosis not present

## 2018-05-22 DIAGNOSIS — I10 Essential (primary) hypertension: Secondary | ICD-10-CM | POA: Diagnosis not present

## 2018-05-22 DIAGNOSIS — G43909 Migraine, unspecified, not intractable, without status migrainosus: Secondary | ICD-10-CM | POA: Diagnosis not present

## 2019-04-16 DIAGNOSIS — I10 Essential (primary) hypertension: Secondary | ICD-10-CM | POA: Diagnosis not present

## 2019-04-16 DIAGNOSIS — Z6837 Body mass index (BMI) 37.0-37.9, adult: Secondary | ICD-10-CM | POA: Diagnosis not present

## 2019-04-16 DIAGNOSIS — N760 Acute vaginitis: Secondary | ICD-10-CM | POA: Diagnosis not present

## 2019-04-16 DIAGNOSIS — Z01419 Encounter for gynecological examination (general) (routine) without abnormal findings: Secondary | ICD-10-CM | POA: Diagnosis not present

## 2019-04-16 DIAGNOSIS — Z3040 Encounter for surveillance of contraceptives, unspecified: Secondary | ICD-10-CM | POA: Diagnosis not present

## 2019-04-16 DIAGNOSIS — Z113 Encounter for screening for infections with a predominantly sexual mode of transmission: Secondary | ICD-10-CM | POA: Diagnosis not present

## 2019-09-25 ENCOUNTER — Encounter: Payer: Self-pay | Admitting: Family Medicine

## 2019-09-25 DIAGNOSIS — Z01419 Encounter for gynecological examination (general) (routine) without abnormal findings: Secondary | ICD-10-CM

## 2019-09-25 NOTE — Progress Notes (Signed)
Patient did not keep appointment today. She may call to reschedule.  

## 2019-11-09 DIAGNOSIS — F331 Major depressive disorder, recurrent, moderate: Secondary | ICD-10-CM | POA: Diagnosis not present

## 2019-11-09 DIAGNOSIS — F5101 Primary insomnia: Secondary | ICD-10-CM | POA: Diagnosis not present

## 2019-11-09 DIAGNOSIS — I10 Essential (primary) hypertension: Secondary | ICD-10-CM | POA: Diagnosis not present

## 2019-11-09 DIAGNOSIS — F319 Bipolar disorder, unspecified: Secondary | ICD-10-CM | POA: Diagnosis not present

## 2019-11-09 DIAGNOSIS — F411 Generalized anxiety disorder: Secondary | ICD-10-CM | POA: Diagnosis not present

## 2019-11-23 ENCOUNTER — Encounter: Payer: Self-pay | Admitting: General Practice

## 2019-12-07 ENCOUNTER — Ambulatory Visit: Payer: BC Managed Care – PPO | Admitting: Internal Medicine

## 2019-12-07 ENCOUNTER — Encounter: Payer: Self-pay | Admitting: Internal Medicine

## 2019-12-07 ENCOUNTER — Telehealth: Payer: Self-pay | Admitting: Radiology

## 2019-12-07 ENCOUNTER — Other Ambulatory Visit: Payer: Self-pay

## 2019-12-07 VITALS — BP 164/112 | HR 81 | Ht 65.0 in | Wt 229.8 lb

## 2019-12-07 DIAGNOSIS — I4892 Unspecified atrial flutter: Secondary | ICD-10-CM | POA: Diagnosis not present

## 2019-12-07 DIAGNOSIS — I43 Cardiomyopathy in diseases classified elsewhere: Secondary | ICD-10-CM | POA: Diagnosis not present

## 2019-12-07 DIAGNOSIS — Z8679 Personal history of other diseases of the circulatory system: Secondary | ICD-10-CM

## 2019-12-07 DIAGNOSIS — R002 Palpitations: Secondary | ICD-10-CM

## 2019-12-07 DIAGNOSIS — R000 Tachycardia, unspecified: Secondary | ICD-10-CM | POA: Diagnosis not present

## 2019-12-07 NOTE — Progress Notes (Signed)
Cardiology Office Note:    Date:  12/07/2019   ID:  Tracy Grant, DOB [birth year], MRN [id number]  PCP:  Collene Mares, PA  Cardiologist:  No primary care provider on file.  Electrophysiologist:  None   Referring MD: Collene Mares, PA   Chief Complaint: follow up reduced EF with subsequent recovery of EF, postpartum CM vs tachycardia CM vs other.   History of Present Illness:    Tracy Grant is a 34 y.o. female with hx of atrial flutter, reduced EF 4 months after delivery of her son, HTN, tobacco abuse, morbid obesity.  She presents to reestablish cardiovascular care.  Please see past history section of HPI for full details of her prior history.  She had previously been on lisinopril,Toprol and had stopped medications due to lack of insurance and follow up.  She does snore, has not had a sleep study. Was out of work when she was seen in afib clinic follow up, so echo not able to be repeated at last follow up. She was placed back on eliquis 5 mg bid for chadsvasc score of 3 (female, htn, previous LV dysfunction) but is not currently taking this.  One episode of chest pain 30-40 seconds yesterday. Chest pain prior to having her son. Chest pain infrequent now.   Took the stairs to get to appointment today, 4 flights of stairs, no symptoms.  5 x a week gym before pandemic, able to easily complete.  Fluttering in chest - few times a week currently.   Snoring - needs sleep study as noted previously.  PAST HISTORY: Initial cardiology notes from May 16, 2014 suggest a history of hyperlipidemia and prehypertension at that time, with a family history of her mother having an MI in her early 65s with stent, though after Dr. Tenny Craw spoke to her mother, this event may have been embolic in nature, and her mother eventually underwent catheter ablation for atrial flutter.  Patient gave birth in June 2015 with an uncomplicated pregnancy and C-section delivery.  Delivery was felt to  be uncomplicated.  Patient noticed intermittent palpitations since her delivery lasting seconds to minutes without chest pain.  In September 2015 she experienced a significant respiratory infection with fever, chills, runny nose, cough.  Her cough persisted.  She at that time denied lower extremity edema, orthopnea, PND.  Just prior to presenting to the hospital she began to have PND with coughing, and was also noted to be tachycardic into the 150s.  She was found of atrial flutter with 2-1 conduction.  She underwent successful TEE cardioversion with 1 150 J shock on 05/17/2014. Despite viral symptoms, troponin negative, suggesting against myocarditis for cause of low EF.  On further review it was unclear whether her cardiomyopathy was peripartum cardiomyopathy or just tachycardia mediated cardiomyopathy.  Ejection fraction was felt to be 35 to 40%.  She was continued on medications for short period of time but did not have insurance and discontinued these.  She was previously taking Xarelto but discontinued for the same reason.  Fortunately by December 2015 her EF normalized.  There was still a suggestion of posterior basal hypokinesis and mild LV dilatation.  Her next follow-up appointment appears to be October 2016.  She had a visit with EP, but did not pursue flutter ablation.  She was next seen by cardiology in August 2018, after significant alcohol consumption and feeling poorly as a result.  She was noted to be again in atrial flutter, with plan for  TEE cardioversion, however she spontaneously converted     Past Medical History:  Diagnosis Date  . Allergy   . Bipolar disorder ([place]) 2014  . Cardiomyopathy ([place])    a. Echo (10/15):  EF 35-40%, mild MR, mild to moderately reduced RV systolic function, PASP 31 mmHg  ; b.  Echo (12/15):  Posterior basal HK, mild LVH, EF 55%, mild LAE.  Marland Kitchen Headache(784.0)   . History of chicken pox   . Hx of migraines   . Hyperlipidemia   . Hypertension   .  Infection    trich    Past Surgical History:  Procedure Laterality Date  . ADENOIDECTOMY    . CESAREAN SECTION N/A 01/17/2014   Procedure: CESAREAN SECTION;  Surgeon: Shelly Bombard, MD;  Location: [facility];  Service: Obstetrics;  Laterality: N/A;  . DILATION AND CURETTAGE OF UTERUS      Current Medications: Current Meds  Medication Sig  . amLODipine (NORVASC) 10 MG tablet Take 10 mg by mouth daily.  Marland Kitchen FLUoxetine (PROZAC) 10 MG capsule Take 10 mg by mouth daily.  Marland Kitchen levonorgestrel (MIRENA) 20 MCG/24HR IUD 1 each by Intrauterine route once.     Allergies:   Lisinopril   Social History   Socioeconomic History  . Marital status: Single    Spouse name: Not on file  . Number of children: 0  . Years of education: 12+  . Highest education level: Not on file  Occupational History    Employer: [employer]  Tobacco Use  . Smoking status: Current Every Day Smoker    Packs/day: 0.50    Years: 5.00    Pack years: 2.50    Types: Cigarettes  . Smokeless tobacco: Never Used  Substance and Sexual Activity  . Alcohol use: Yes    Comment: socially  . Drug use: No  . Sexual activity: Yes    Birth control/protection: None    Comment: last intercourse Jul 31 2014  Other Topics Concern  . Not on file  Social History Narrative   Regular exercise-no   Caffeine Use-yes   Social Determinants of Health   Financial Resource Strain:   . Difficulty of Paying Living Expenses:   Food Insecurity:   . Worried About Charity fundraiser in the Last Year:   . Arboriculturist in the Last Year:   Transportation Needs:   . Film/video editor (Medical):   Marland Kitchen Lack of Transportation (Non-Medical):   Physical Activity:   . Days of Exercise per Week:   . Minutes of Exercise per Session:   Stress:   . Feeling of Stress :   Social Connections:   . Frequency of Communication with Friends and Family:   . Frequency of Social Gatherings with Friends and Family:   . Attends Religious Services:    . Active Member of Clubs or Organizations:   . Attends Archivist Meetings:   Marland Kitchen Marital Status:      Family History: The patient's family history includes Heart attack in her mother; Hypertension in her brother, father, maternal grandfather, maternal grandmother, mother, paternal grandfather, and paternal grandmother.  ROS:   Please see the history of present illness.    All other systems reviewed and are negative.  EKGs/Labs/Other Studies Reviewed:    The following studies were reviewed today:  EKG:  SR, short PR 96 ms, ST abnormality inferior and lead V6.  Recent Labs: No results found for requested labs within last 8760 hours.  Recent Lipid Panel    Component Value Date/Time   CHOL 156 08/08/2012 0903   TRIG 198.0 (H) 08/08/2012 0903   HDL 34.90 (L) 08/08/2012 0903   CHOLHDL 4 08/08/2012 0903   VLDL 39.6 08/08/2012 0903   LDLCALC 82 08/08/2012 0903    Physical Exam:    VS:  BP (!) 164/112   Pulse 81   Ht 5\' 5"  (1.651 m)   Wt 229 lb 12.8 oz (104.2 kg)   BMI 38.24 kg/m     Wt Readings from Last 5 Encounters:  12/07/19 229 lb 12.8 oz (104.2 kg)  05/10/17 226 lb 6.4 oz (102.7 kg)  03/24/17 235 lb 0.2 oz (106.6 kg)  03/28/16 221 lb 8 oz (100.5 kg)  01/04/16 215 lb (97.5 kg)     Constitutional: No acute distress Eyes: sclera non-icteric, normal conjunctiva and lids ENMT: face mask in place Cardiovascular: regular rhythm, normal rate, no murmurs. S1 and S2 normal. Radial pulses normal bilaterally. No jugular venous distention.  Respiratory: clear to auscultation bilaterally GI : normal bowel sounds, soft and nontender. No distention.   MSK: extremities warm, well perfused. No edema.  NEURO: grossly nonfocal exam, moves all extremities. PSYCH: alert and oriented x 3, normal mood and affect.   ASSESSMENT:    1. History of heart failure   2. 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 Atrial flutter, unspecified type (HCC)   4.  Palpitations    PLAN:    Reduced EF with subsequent recovery of EF, postpartum CM vs tachycardia CM vs other- NYHA class I symptoms now.  She had recovery of EF within about 2 months of the initial echo (October to December 2015) and has not had interval imaging since that time.  We discussed that we will need an echocardiogram to reevaluate ejection fraction to make further clinical decisions about medical therapy.  She is me if she can ever get pregnant again.  The etiology of her heart failure is currently not well established and was felt to be either tachycardia mediated from fast atrial flutter or peripartum cardiomyopathy.  It is encouraging that her EF recovered, however the inciting event is in.  Myocarditis less likely with a normal troponin on presentation in October 2015.  In addition scad should be considered in the setting of temporal correlation to her delivery of her baby, however again no definite evidence of ischemia was noted during that initial evaluation.  Most likely etiologies are tachycardia mediated cardiomyopathy as well as postpartum cardiomyopathy.  She has had intermittent compliance with medications due to lack of insurance.  We will need to reassess EF to better understand further treatment.  I have encouraged her to establish with her OB/GYN to have these discussions from a high risk OB standpoint.  She had angioedema with lisinopril cannot take this medication.  On review of her medications today it appears she is only taking amlodipine for hypertension.  She is essentially asymptomatic at this time which is wonderful, and has no signs of heart failure.  Continue to observe and we will revisit medical therapy after testing is complete. Plan: ECHOCARDIOGRAM COMPLETE  Abnormal ECG-echocardiogram to help November 2015 evaluate wall motion.   Atrial flutter, unspecified type (HCC)  Palpitations -  She continues to have palpitations we will need to obtain a monitor to evaluate these  further.  She has symptoms several times a week.  If she continues to have atrial flutter on monitor, we will refer back to EP for  discussion of ablation which may benefit her in the long run.  Not currently on anticoagulation, will review after monitor results.  Chads vas score is 3 from prior occasions.  (Female, hypertension, heart failure)  Plan: EKG 12-Lead, ECHOCARDIOGRAM COMPLETE, LONG TERM MONITOR (3-14 DAYS)  Hypertension-continue amlodipine  Female of childbearing age considering future pregnancy-we will need to be mindful of medical therapy in relation to her plans to have a future pregnancy.  I have asked her to let me know when she plans a pregnancy so it may be cautious with her medical therapy.  She is not currently pregnant to her knowledge.  Total time of encounter: 80 minutes total time of encounter, including 40 minutes spent in face-to-face patient care on the date of this encounter. This time includes coordination of care and counseling regarding above mentioned problem list. Remainder of non-face-to-face time involved reviewing chart documents/testing relevant to the patient encounter and extensive review of documentation in the medical record including hospitalizations and relevant laboratory and imaging studies. I have independently reviewed documentation from referring provider.   Weston Brass, MD [place]  [facility]    Medication Adjustments/Labs and Tests Ordered: Current medicines are reviewed at length with the patient today.  Concerns regarding medicines are outlined above.  Orders Placed This Encounter  Procedures  . LONG TERM MONITOR (3-14 DAYS)  . EKG 12-Lead  . ECHOCARDIOGRAM COMPLETE   No orders of the defined types were placed in this encounter.   Patient Instructions  Medication Instructions:  Continue same medications    Lab Work: None ordered    Testing/Procedures: Schedule Echo Schedule 2 week Zio Monitor   Follow-Up: At  [facility], you and your health needs are our priority.  As part of our continuing mission to provide you with exceptional heart care, we have created designated Provider Care Teams.  These Care Teams include your primary Cardiologist (physician) and Advanced Practice Providers (APPs -  Physician Assistants and Nurse Practitioners) who all work together to provide you with the care you need, when you need it.  We recommend signing up for the patient portal called "MyChart".  Sign up information is provided on this After Visit Summary.  MyChart is used to connect with patients for Virtual Visits (Telemedicine).  Patients are able to view lab/test results, encounter notes, upcoming appointments, etc.  Non-urgent messages can be sent to your provider as well.   To learn more about what you can do with MyChart, go to [email or web address].    Your next appointment: Schedule after test    The format for your next appointment: Office   Provider: Dr.Kienan Doublin

## 2019-12-07 NOTE — Telephone Encounter (Signed)
Enrolled patient for a 14 day Zio monitor to be mailed to patients home.  

## 2019-12-07 NOTE — Patient Instructions (Signed)
Medication Instructions:  Continue same medications    Lab Work: None ordered    Testing/Procedures: Schedule Echo Schedule 2 week Zio Monitor   Follow-Up: At [facility], you and your health needs are our priority.  As part of our continuing mission to provide you with exceptional heart care, we have created designated Provider Care Teams.  These Care Teams include your primary Cardiologist (physician) and Advanced Practice Providers (APPs -  Physician Assistants and Nurse Practitioners) who all work together to provide you with the care you need, when you need it.  We recommend signing up for the patient portal called "MyChart".  Sign up information is provided on this After Visit Summary.  MyChart is used to connect with patients for Virtual Visits (Telemedicine).  Patients are able to view lab/test results, encounter notes, upcoming appointments, etc.  Non-urgent messages can be sent to your provider as well.   To learn more about what you can do with MyChart, go to [email or web address].    Your next appointment: Schedule after test    The format for your next appointment: Office   Provider: Dr.Acharya

## 2019-12-10 ENCOUNTER — Telehealth: Payer: Self-pay | Admitting: Internal Medicine

## 2019-12-12 DIAGNOSIS — L309 Dermatitis, unspecified: Secondary | ICD-10-CM | POA: Diagnosis not present

## 2019-12-12 DIAGNOSIS — F331 Major depressive disorder, recurrent, moderate: Secondary | ICD-10-CM | POA: Diagnosis not present

## 2019-12-12 DIAGNOSIS — I10 Essential (primary) hypertension: Secondary | ICD-10-CM | POA: Diagnosis not present

## 2019-12-18 ENCOUNTER — Other Ambulatory Visit (INDEPENDENT_AMBULATORY_CARE_PROVIDER_SITE_OTHER): Payer: BC Managed Care – PPO

## 2019-12-18 DIAGNOSIS — I43 Cardiomyopathy in diseases classified elsewhere: Secondary | ICD-10-CM

## 2019-12-18 DIAGNOSIS — R002 Palpitations: Secondary | ICD-10-CM

## 2019-12-18 DIAGNOSIS — R000 Tachycardia, unspecified: Secondary | ICD-10-CM | POA: Diagnosis not present

## 2019-12-18 DIAGNOSIS — I4892 Unspecified atrial flutter: Secondary | ICD-10-CM

## 2019-12-21 ENCOUNTER — Other Ambulatory Visit (HOSPITAL_COMMUNITY): Payer: BC Managed Care – PPO

## 2020-01-08 ENCOUNTER — Other Ambulatory Visit: Payer: Self-pay

## 2020-01-08 ENCOUNTER — Ambulatory Visit (HOSPITAL_COMMUNITY): Payer: BC Managed Care – PPO | Attending: Cardiology

## 2020-01-08 DIAGNOSIS — I4892 Unspecified atrial flutter: Secondary | ICD-10-CM | POA: Diagnosis not present

## 2020-01-08 DIAGNOSIS — R000 Tachycardia, unspecified: Secondary | ICD-10-CM | POA: Diagnosis not present

## 2020-01-08 DIAGNOSIS — R002 Palpitations: Secondary | ICD-10-CM | POA: Insufficient documentation

## 2020-01-08 DIAGNOSIS — I43 Cardiomyopathy in diseases classified elsewhere: Secondary | ICD-10-CM | POA: Diagnosis not present

## 2020-03-18 ENCOUNTER — Encounter: Payer: Self-pay | Admitting: Internal Medicine

## 2020-03-18 ENCOUNTER — Other Ambulatory Visit: Payer: Self-pay

## 2020-03-18 ENCOUNTER — Ambulatory Visit: Payer: BC Managed Care – PPO | Admitting: Internal Medicine

## 2020-03-18 VITALS — BP 150/110 | HR 76 | Ht 65.0 in | Wt 245.0 lb

## 2020-03-18 DIAGNOSIS — I4892 Unspecified atrial flutter: Secondary | ICD-10-CM

## 2020-03-18 DIAGNOSIS — Z8679 Personal history of other diseases of the circulatory system: Secondary | ICD-10-CM

## 2020-03-18 DIAGNOSIS — O903 Peripartum cardiomyopathy: Secondary | ICD-10-CM

## 2020-03-18 DIAGNOSIS — I517 Cardiomegaly: Secondary | ICD-10-CM

## 2020-03-18 DIAGNOSIS — R002 Palpitations: Secondary | ICD-10-CM | POA: Diagnosis not present

## 2020-03-18 DIAGNOSIS — I10 Essential (primary) hypertension: Secondary | ICD-10-CM

## 2020-03-18 MED ORDER — METOPROLOL SUCCINATE ER 25 MG PO TB24: 25.0000 mg | ORAL_TABLET | Freq: Every day | ORAL | 3 refills | Status: DC

## 2020-03-18 NOTE — Progress Notes (Signed)
Cardiology Office Note:    Date:  03/18/2020   ID:  Tracy Grant, DOB [birth year], MRN [id number]  PCP:  Collene Mares, PA  Cardiologist:  No primary care provider on file.  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  History of Present Illness:    Tracy Grant is a 34 y.o. female with a history of atrial flutter and reduced EF 4 months after the delivery of her son with subsequent recovery of EF, hypertension, tobacco abuse, morbid obesity.  She presents for follow-up.  Blood pressure is elevated today 150/110 mmHg.  She is currently taking amlodipine.  She was taking metoprolol succinate 25 mg daily but she stopped this due to lack of insurance coverage.  She has angioedema with lisinopril and this medication should not be used.  She is considering future pregnancy but is not planning one at the moment.  We discussed pregnancy safe options for blood pressure management.  I requested that she contact our office when she is planning pregnancy so that we can review her medications with the pharmacist.  No recurrence of atrial flutter on monitor.  We reviewed the results which showed 1 brief episode of NSVT and otherwise rare ectopy.  Echocardiogram showed normal ejection fraction and moderate LVH which may be secondary to hypertension.  She denies chest pain, significant shortness of breath, palpitations, PND, orthopnea, leg swelling.  PAST HISTORY: Initial cardiology notes from May 16, 2014 suggest a history of hyperlipidemia and prehypertension at that time, with a family history of her mother having an MI in her early 24s with stent, though after Dr. Tenny Craw spoke to her mother, this event may have been embolic in nature, and her mother eventually underwent catheter ablation for atrial flutter.  Patient gave birth in June 2015 with  an uncomplicated pregnancy and C-section delivery.  Delivery was felt to be uncomplicated.  Patient noticed intermittent palpitations since her delivery lasting seconds to minutes without chest pain.  In September 2015 she experienced a significant respiratory infection with fever, chills, runny nose, cough.  Her cough persisted.  She at that time denied lower extremity edema, orthopnea, PND.  Just prior to presenting to the hospital she began to have PND with coughing, and was also noted to be tachycardic into the 150s.  She was found of atrial flutter with 2-1 conduction.  She underwent successful TEE cardioversion with 1 150 J shock on 05/17/2014. Despite viral symptoms, troponin negative, suggesting against myocarditis for cause of low EF.   On further review it was unclear whether her cardiomyopathy was peripartum cardiomyopathy or just tachycardia mediated cardiomyopathy.  Ejection fraction was felt to be 35 to 40%.  She was continued on medications for short period of time but did not have insurance and discontinued these.  She was previously taking Xarelto but discontinued for the same reason.  Fortunately by December 2015 her EF normalized.  There was still a suggestion of posterior basal hypokinesis and mild LV dilatation.   Her next follow-up appointment appears to be October 2016.  She had a visit with EP, but did not pursue flutter ablation.  She was next seen by cardiology in August 2018, after significant alcohol consumption and feeling poorly as a result.  She was noted to be again in atrial flutter, with plan for TEE cardioversion, however she spontaneously converted   Past Medical History:  Diagnosis  Date  . Allergy   . Bipolar disorder (HCC) 2014  . Cardiomyopathy (HCC)    a. Echo (10/15):  EF 35-40%, mild MR, mild to moderately reduced RV systolic function, PASP 31 mmHg  ; b.  Echo (12/15):  Posterior basal HK, mild LVH, EF 55%, mild LAE.  Marland Kitchen Headache(784.0)   . History of chicken pox    . Hx of migraines   . Hyperlipidemia   . Hypertension   . Infection    trich    Past Surgical History:  Procedure Laterality Date  . ADENOIDECTOMY    . CESAREAN SECTION N/A 01/17/2014   Procedure: CESAREAN SECTION;  Surgeon: Brock Bad, MD;  Location: [facility];  Service: Obstetrics;  Laterality: N/A;  . DILATION AND CURETTAGE OF UTERUS      Current Medications: Current Meds  Medication Sig  . amLODipine (NORVASC) 10 MG tablet Take 10 mg by mouth daily.  Marland Kitchen FLUoxetine (PROZAC) 10 MG capsule Take 10 mg by mouth daily.  Marland Kitchen levonorgestrel (MIRENA) 20 MCG/24HR IUD 1 each by Intrauterine route once.     Allergies:   Lisinopril   Social History   Socioeconomic History  . Marital status: Single    Spouse name: Not on file  . Number of children: 0  . Years of education: 12+  . Highest education level: Not on file  Occupational History    Employer: [employer]  Tobacco Use  . Smoking status: Current Every Day Smoker    Packs/day: 0.50    Years: 5.00    Pack years: 2.50    Types: Cigarettes  . Smokeless tobacco: Never Used  Substance and Sexual Activity  . Alcohol use: Yes    Comment: socially  . Drug use: No  . Sexual activity: Yes    Birth control/protection: None    Comment: last intercourse Jul 31 2014  Other Topics Concern  . Not on file  Social History Narrative   Regular exercise-no   Caffeine Use-yes   Social Determinants of Health   Financial Resource Strain:   . Difficulty of Paying Living Expenses:   Food Insecurity:   . Worried About Programme researcher, broadcasting/film/video in the Last Year:   . Barista in the Last Year:   Transportation Needs:   . Freight forwarder (Medical):   Marland Kitchen Lack of Transportation (Non-Medical):   Physical Activity:   . Days of Exercise per Week:   . Minutes of Exercise per Session:   Stress:   . Feeling of Stress :   Social Connections:   . Frequency of Communication with Friends and Family:   . Frequency of Social  Gatherings with Friends and Family:   . Attends Religious Services:   . Active Member of Clubs or Organizations:   . Attends Banker Meetings:   Marland Kitchen Marital Status:      Family History: The patient's family history includes Heart attack in her mother; Hypertension in her brother, father, maternal grandfather, maternal grandmother, mother, paternal grandfather, and paternal grandmother.  ROS:   Please see the history of present illness.    All other systems reviewed and are negative.  EKGs/Labs/Other Studies Reviewed:    The following studies were reviewed today: Recent Labs: No results found for requested labs within last 8760 hours.  Recent Lipid Panel    Component Value Date/Time   CHOL 156 08/08/2012 0903   TRIG 198.0 (H) 08/08/2012 0903   HDL 34.90 (L) 08/08/2012 2831  CHOLHDL 4 08/08/2012 0903   VLDL 39.6 08/08/2012 0903   LDLCALC 82 08/08/2012 0903    Physical Exam:    VS:  BP (!) 150/110 (BP Location: Left Arm, Patient Position: Sitting, Cuff Size: Large)   Pulse 76   Ht 5\' 5"  (1.651 m)   Wt 245 lb (111.1 kg)   BMI 40.77 kg/m     Wt Readings from Last 5 Encounters:  03/18/20 245 lb (111.1 kg)  12/07/19 229 lb 12.8 oz (104.2 kg)  05/10/17 226 lb 6.4 oz (102.7 kg)  03/24/17 235 lb 0.2 oz (106.6 kg)  03/28/16 221 lb 8 oz (100.5 kg)     Constitutional: No acute distress Eyes: sclera non-icteric, normal conjunctiva and lids ENMT: normal dentition, moist mucous membranes Cardiovascular: regular rhythm, normal rate, no murmurs. S1 and S2 normal. Radial pulses normal bilaterally. No jugular venous distention.  Respiratory: clear to auscultation bilaterally GI : normal bowel sounds, soft and nontender. No distention.   MSK: extremities warm, well perfused. No edema.  NEURO: grossly nonfocal exam, moves all extremities. PSYCH: alert and oriented x 3, normal mood and affect.   ASSESSMENT:    1. Postpartum cardiomyopathy   2. Atrial flutter,  unspecified type (HCC)   3. Palpitations   4. History of heart failure   5. Essential hypertension   6. LVH (left ventricular hypertrophy)    PLAN:    Postpartum cardiomyopathy  Atrial flutter, unspecified type (HCC)  Palpitations  History of heart failure  Essential hypertension  LVH (left ventricular hypertrophy)   The nature of her cardiomyopathy is not entirely clear.  It could be secondary to tachycardia mediated cardiomyopathy versus a true postpartum cardiomyopathy.  I have counseled her on future pregnancies and the risk of recurrence.  I recommend she establish care with a high risk OB if planning a future pregnancy.  She needs further blood pressure control, for the benefit of atrial arrhythmia history as well as reduced EF, will continue metoprolol succinate 25 mg daily which was previously discontinued.  She may need further blood pressure control, we will discuss this at follow-up.  Prior to getting pregnant, we could consider cardiac MRI to better understand if there is an infiltrative process or scar suggestive of prior myocarditis or scad contributing to her previously low EF.  We will discuss this at follow-up.  Triglycerides are elevated, will discuss diet lifestyle further at follow-up.  Total time of encounter: 30 minutes total time of encounter, including 25 minutes spent in face-to-face patient care on the date of this encounter. This time includes coordination of care and counseling regarding above mentioned problem list. Remainder of non-face-to-face time involved reviewing chart documents/testing relevant to the patient encounter and documentation in the medical record. I have independently reviewed documentation from referring provider.   03/30/16, MD [place]  [facility]    Medication Adjustments/Labs and Tests Ordered: Current medicines are reviewed at length with the patient today.  Concerns regarding medicines are outlined above.  No  orders of the defined types were placed in this encounter.  Meds ordered this encounter  Medications  . metoprolol succinate (TOPROL-XL) 25 MG 24 hr tablet    Sig: Take 1 tablet (25 mg total) by mouth daily. Take with or immediately following a meal.    Dispense:  90 tablet    Refill:  3    Patient Instructions  Medication Instructions:  Start Metoprolol Succinate 25mg  once a day *If you need a refill on your cardiac  medications before your next appointment, please call your pharmacy*   Lab Work: None Ordered If you have labs (blood work) drawn today and your tests are completely normal, you will receive your results only by: Marland Kitchen MyChart Message (if you have MyChart) OR . A paper copy in the mail If you have any lab test that is abnormal or we need to change your treatment, we will call you to review the results.   Testing/Procedures: None Ordered   Follow-Up: At [facility], you and your health needs are our priority.  As part of our continuing mission to provide you with exceptional heart care, we have created designated Provider Care Teams.  These Care Teams include your primary Cardiologist (physician) and Advanced Practice Providers (APPs -  Physician Assistants and Nurse Practitioners) who all work together to provide you with the care you need, when you need it.  We recommend signing up for the patient portal called "MyChart".  Sign up information is provided on this After Visit Summary.  MyChart is used to connect with patients for Virtual Visits (Telemedicine).  Patients are able to view lab/test results, encounter notes, upcoming appointments, etc.  Non-urgent messages can be sent to your provider as well.   To learn more about what you can do with MyChart, go to [email or web address].    Your next appointment:   6 week(s)  The format for your next appointment:   In Person  Provider:   Weston Brass, MD   Other Instructions

## 2020-03-18 NOTE — Patient Instructions (Signed)
Medication Instructions:  Start Metoprolol Succinate 25mg  once a day *If you need a refill on your cardiac medications before your next appointment, please call your pharmacy*   Lab Work: None Ordered If you have labs (blood work) drawn today and your tests are completely normal, you will receive your results only by: MyChart Message (if you have MyChart) OR . A paper copy in the mail If you have any lab test that is abnormal or we need to change your treatment, we will call you to review the results.   Testing/Procedures: None Ordered   Follow-Up: At [facility], you and your health needs are our priority.  As part of our continuing mission to provide you with exceptional heart care, we have created designated Provider Care Teams.  These Care Teams include your primary Cardiologist (physician) and Advanced Practice Providers (APPs -  Physician Assistants and Nurse Practitioners) who all work together to provide you with the care you need, when you need it.  We recommend signing up for the patient portal called "MyChart".  Sign up information is provided on this After Visit Summary.  MyChart is used to connect with patients for Virtual Visits (Telemedicine).  Patients are able to view lab/test results, encounter notes, upcoming appointments, etc.  Non-urgent messages can be sent to your provider as well.   To learn more about what you can do with MyChart, go to [facility].    Your next appointment:   6 week(s)  The format for your next appointment:   In Person  Provider:   [email or web address], MD   Other Instructions

## 2020-04-22 DIAGNOSIS — I10 Essential (primary) hypertension: Secondary | ICD-10-CM | POA: Diagnosis not present

## 2020-04-22 DIAGNOSIS — Z20822 Contact with and (suspected) exposure to covid-19: Secondary | ICD-10-CM | POA: Diagnosis not present

## 2020-04-22 DIAGNOSIS — F5101 Primary insomnia: Secondary | ICD-10-CM | POA: Diagnosis not present

## 2020-05-13 ENCOUNTER — Ambulatory Visit: Payer: BC Managed Care – PPO | Admitting: Internal Medicine

## 2020-05-20 DIAGNOSIS — Z20822 Contact with and (suspected) exposure to covid-19: Secondary | ICD-10-CM | POA: Diagnosis not present

## 2020-09-30 ENCOUNTER — Ambulatory Visit
Admission: RE | Admit: 2020-09-30 | Discharge: 2020-09-30 | Disposition: A | Discharge status: Home / Self Care | Payer: BC Managed Care – PPO | Source: Ambulatory Visit | Attending: Internal Medicine | Admitting: Internal Medicine

## 2020-09-30 ENCOUNTER — Other Ambulatory Visit: Payer: Self-pay | Admitting: Internal Medicine

## 2020-09-30 DIAGNOSIS — R059 Cough, unspecified: Secondary | ICD-10-CM

## 2020-09-30 DIAGNOSIS — I10 Essential (primary) hypertension: Secondary | ICD-10-CM | POA: Diagnosis not present

## 2020-09-30 DIAGNOSIS — R0602 Shortness of breath: Secondary | ICD-10-CM | POA: Diagnosis not present

## 2020-09-30 DIAGNOSIS — R0600 Dyspnea, unspecified: Secondary | ICD-10-CM | POA: Diagnosis not present

## 2020-09-30 DIAGNOSIS — F331 Major depressive disorder, recurrent, moderate: Secondary | ICD-10-CM | POA: Diagnosis not present

## 2020-10-09 DIAGNOSIS — F4320 Adjustment disorder, unspecified: Secondary | ICD-10-CM | POA: Diagnosis not present

## 2020-10-15 DIAGNOSIS — F4320 Adjustment disorder, unspecified: Secondary | ICD-10-CM | POA: Diagnosis not present

## 2020-10-23 DIAGNOSIS — F4320 Adjustment disorder, unspecified: Secondary | ICD-10-CM | POA: Diagnosis not present

## 2020-10-29 DIAGNOSIS — F331 Major depressive disorder, recurrent, moderate: Secondary | ICD-10-CM | POA: Diagnosis not present

## 2020-10-29 DIAGNOSIS — I10 Essential (primary) hypertension: Secondary | ICD-10-CM | POA: Diagnosis not present

## 2020-11-18 DIAGNOSIS — F4320 Adjustment disorder, unspecified: Secondary | ICD-10-CM | POA: Diagnosis not present

## 2020-12-05 ENCOUNTER — Encounter: Payer: Self-pay | Admitting: Radiology

## 2021-01-01 ENCOUNTER — Encounter: Payer: BC Managed Care – PPO | Admitting: Obstetrics & Gynecology

## 2021-04-10 DIAGNOSIS — I10 Essential (primary) hypertension: Secondary | ICD-10-CM | POA: Diagnosis not present

## 2021-04-10 DIAGNOSIS — F331 Major depressive disorder, recurrent, moderate: Secondary | ICD-10-CM | POA: Diagnosis not present

## 2021-04-10 DIAGNOSIS — Z0000 Encounter for general adult medical examination without abnormal findings: Secondary | ICD-10-CM | POA: Diagnosis not present

## 2021-04-10 DIAGNOSIS — Z13220 Encounter for screening for lipoid disorders: Secondary | ICD-10-CM | POA: Diagnosis not present

## 2021-04-10 DIAGNOSIS — F411 Generalized anxiety disorder: Secondary | ICD-10-CM | POA: Diagnosis not present

## 2021-04-27 DIAGNOSIS — F411 Generalized anxiety disorder: Secondary | ICD-10-CM | POA: Diagnosis not present

## 2021-04-27 DIAGNOSIS — F410 Panic disorder [episodic paroxysmal anxiety] without agoraphobia: Secondary | ICD-10-CM | POA: Diagnosis not present

## 2021-04-27 DIAGNOSIS — F331 Major depressive disorder, recurrent, moderate: Secondary | ICD-10-CM | POA: Diagnosis not present

## 2021-04-27 DIAGNOSIS — F5101 Primary insomnia: Secondary | ICD-10-CM | POA: Diagnosis not present

## 2021-05-02 ENCOUNTER — Observation Stay (HOSPITAL_COMMUNITY)
Admission: EM | Admit: 2021-05-02 | Discharge: 2021-05-03 | Disposition: A | Discharge status: Home / Self Care | Payer: BC Managed Care – PPO | Attending: Internal Medicine | Admitting: Internal Medicine

## 2021-05-02 ENCOUNTER — Other Ambulatory Visit: Payer: Self-pay

## 2021-05-02 ENCOUNTER — Emergency Department (HOSPITAL_COMMUNITY): Payer: BC Managed Care – PPO

## 2021-05-02 ENCOUNTER — Encounter (HOSPITAL_COMMUNITY): Payer: Self-pay

## 2021-05-02 DIAGNOSIS — R778 Other specified abnormalities of plasma proteins: Secondary | ICD-10-CM | POA: Diagnosis not present

## 2021-05-02 DIAGNOSIS — Z79899 Other long term (current) drug therapy: Secondary | ICD-10-CM | POA: Insufficient documentation

## 2021-05-02 DIAGNOSIS — R519 Headache, unspecified: Secondary | ICD-10-CM | POA: Diagnosis not present

## 2021-05-02 DIAGNOSIS — R0789 Other chest pain: Secondary | ICD-10-CM | POA: Diagnosis not present

## 2021-05-02 DIAGNOSIS — I160 Hypertensive urgency: Secondary | ICD-10-CM | POA: Diagnosis not present

## 2021-05-02 DIAGNOSIS — Z6841 Body Mass Index (BMI) 40.0 and over, adult: Secondary | ICD-10-CM | POA: Diagnosis present

## 2021-05-02 DIAGNOSIS — I169 Hypertensive crisis, unspecified: Secondary | ICD-10-CM

## 2021-05-02 DIAGNOSIS — I10 Essential (primary) hypertension: Secondary | ICD-10-CM | POA: Insufficient documentation

## 2021-05-02 DIAGNOSIS — F17210 Nicotine dependence, cigarettes, uncomplicated: Secondary | ICD-10-CM | POA: Diagnosis not present

## 2021-05-02 DIAGNOSIS — Z8679 Personal history of other diseases of the circulatory system: Secondary | ICD-10-CM

## 2021-05-02 DIAGNOSIS — Z8659 Personal history of other mental and behavioral disorders: Secondary | ICD-10-CM

## 2021-05-02 DIAGNOSIS — Z20822 Contact with and (suspected) exposure to covid-19: Secondary | ICD-10-CM | POA: Diagnosis not present

## 2021-05-02 DIAGNOSIS — Z23 Encounter for immunization: Secondary | ICD-10-CM | POA: Diagnosis not present

## 2021-05-02 DIAGNOSIS — R079 Chest pain, unspecified: Secondary | ICD-10-CM | POA: Diagnosis not present

## 2021-05-02 DIAGNOSIS — R002 Palpitations: Secondary | ICD-10-CM | POA: Diagnosis present

## 2021-05-02 DIAGNOSIS — R7989 Other specified abnormal findings of blood chemistry: Secondary | ICD-10-CM | POA: Diagnosis present

## 2021-05-02 DIAGNOSIS — I161 Hypertensive emergency: Secondary | ICD-10-CM | POA: Diagnosis not present

## 2021-05-02 LAB — CBC
HCT: 42.8 % (ref 36.0–46.0)
Hemoglobin: 14.9 g/dL (ref 12.0–15.0)
MCH: 33.3 pg (ref 26.0–34.0)
MCHC: 34.8 g/dL (ref 30.0–36.0)
MCV: 95.5 fL (ref 80.0–100.0)
Platelets: 185 10*3/uL (ref 150–400)
RBC: 4.48 MIL/uL (ref 3.87–5.11)
RDW: 11.8 % (ref 11.5–15.5)
WBC: 4 10*3/uL (ref 4.0–10.5)
nRBC: 0 % (ref 0.0–0.2)

## 2021-05-02 LAB — COMPREHENSIVE METABOLIC PANEL
ALT: 22 U/L (ref 0–44)
AST: 28 U/L (ref 15–41)
Albumin: 4.1 g/dL (ref 3.5–5.0)
Alkaline Phosphatase: 44 U/L (ref 38–126)
Anion gap: 12 (ref 5–15)
BUN: 9 mg/dL (ref 6–20)
CO2: 25 mmol/L (ref 22–32)
Calcium: 10 mg/dL (ref 8.9–10.3)
Chloride: 100 mmol/L (ref 98–111)
Creatinine, Ser: 1.02 mg/dL — ABNORMAL HIGH (ref 0.44–1.00)
GFR, Estimated: >60 mL/min (ref >60)
Glucose, Bld: 114 mg/dL — ABNORMAL HIGH (ref 70–99)
Potassium: 4.3 mmol/L (ref 3.5–5.1)
Sodium: 137 mmol/L (ref 135–145)
Total Bilirubin: 0.8 mg/dL (ref 0.3–1.2)
Total Protein: 7.8 g/dL (ref 6.5–8.1)

## 2021-05-02 LAB — MAGNESIUM: Magnesium: 2 mg/dL (ref 1.7–2.4)

## 2021-05-02 LAB — HIV ANTIBODY (ROUTINE TESTING W REFLEX): HIV Screen 4th Generation wRfx: NONREACTIVE

## 2021-05-02 LAB — I-STAT BETA HCG BLOOD, ED (MC, WL, AP ONLY): I-stat hCG, quantitative: <5 m[IU]/mL (ref <5)

## 2021-05-02 LAB — SARS CORONAVIRUS 2 (TAT 6-24 HRS): SARS Coronavirus 2: NEGATIVE

## 2021-05-02 LAB — TROPONIN I (HIGH SENSITIVITY)
Troponin I (High Sensitivity): 20 ng/L — ABNORMAL HIGH (ref <18)
Troponin I (High Sensitivity): 18 ng/L — ABNORMAL HIGH (ref <18)

## 2021-05-02 LAB — BRAIN NATRIURETIC PEPTIDE: B Natriuretic Peptide: 47.8 pg/mL (ref 0.0–100.0)

## 2021-05-02 LAB — TSH: TSH: 0.916 u[IU]/mL (ref 0.350–4.500)

## 2021-05-02 MED ORDER — AMLODIPINE BESYLATE 10 MG PO TABS
10.0000 mg | ORAL_TABLET | Freq: Every day | ORAL | Status: DC
  Administered 2021-05-02 – 2021-05-03 (×2): 10 mg via ORAL
  Filled 2021-05-02: qty 1
  Filled 2021-05-02: qty 2

## 2021-05-02 MED ORDER — TRAZODONE HCL 50 MG PO TABS
50.0000 mg | ORAL_TABLET | Freq: Every evening | ORAL | Status: DC | PRN
  Administered 2021-05-02: 50 mg via ORAL
  Filled 2021-05-02: qty 1

## 2021-05-02 MED ORDER — ENOXAPARIN SODIUM 40 MG/0.4ML IJ SOSY
40.0000 mg | PREFILLED_SYRINGE | INTRAMUSCULAR | Status: DC
  Administered 2021-05-02 – 2021-05-03 (×2): 40 mg via SUBCUTANEOUS
  Filled 2021-05-02 (×2): qty 0.4

## 2021-05-02 MED ORDER — SODIUM CHLORIDE 0.9% FLUSH
3.0000 mL | Freq: Two times a day (BID) | INTRAVENOUS | Status: DC
  Administered 2021-05-02 – 2021-05-03 (×3): 3 mL via INTRAVENOUS

## 2021-05-02 MED ORDER — BUTALBITAL-APAP-CAFFEINE 50-325-40 MG PO TABS
1.0000 | ORAL_TABLET | Freq: Four times a day (QID) | ORAL | Status: AC | PRN
  Administered 2021-05-02 – 2021-05-03 (×3): 1 via ORAL
  Filled 2021-05-02 (×3): qty 1

## 2021-05-02 MED ORDER — DIPHENHYDRAMINE HCL 50 MG/ML IJ SOLN
25.0000 mg | Freq: Once | INTRAMUSCULAR | Status: AC
  Administered 2021-05-02: 25 mg via INTRAVENOUS
  Filled 2021-05-02: qty 1

## 2021-05-02 MED ORDER — GUAIFENESIN ER 600 MG PO TB12: 600.0000 mg | ORAL_TABLET | Freq: Two times a day (BID) | ORAL | Status: DC | PRN

## 2021-05-02 MED ORDER — ONDANSETRON HCL 4 MG/2ML IJ SOLN: 4.0000 mg | Freq: Four times a day (QID) | INTRAMUSCULAR | Status: DC | PRN

## 2021-05-02 MED ORDER — FLUOXETINE HCL 20 MG PO CAPS
20.0000 mg | ORAL_CAPSULE | Freq: Every day | ORAL | Status: DC
  Administered 2021-05-02 – 2021-05-03 (×2): 20 mg via ORAL
  Filled 2021-05-02 (×2): qty 1

## 2021-05-02 MED ORDER — FENTANYL CITRATE PF 50 MCG/ML IJ SOSY
50.0000 ug | PREFILLED_SYRINGE | Freq: Once | INTRAMUSCULAR | Status: AC
Start: 2021-05-02 — End: 2021-05-02
  Administered 2021-05-02: 50 ug via INTRAVENOUS
  Filled 2021-05-02: qty 1

## 2021-05-02 MED ORDER — ACETAMINOPHEN 325 MG PO TABS: 650.0000 mg | ORAL_TABLET | Freq: Four times a day (QID) | ORAL | Status: DC | PRN

## 2021-05-02 MED ORDER — PROCHLORPERAZINE EDISYLATE 10 MG/2ML IJ SOLN
10.0000 mg | Freq: Once | INTRAMUSCULAR | Status: AC
  Administered 2021-05-02: 10 mg via INTRAVENOUS
  Filled 2021-05-02: qty 2

## 2021-05-02 MED ORDER — ALBUTEROL SULFATE (2.5 MG/3ML) 0.083% IN NEBU: 2.5000 mg | INHALATION_SOLUTION | Freq: Four times a day (QID) | RESPIRATORY_TRACT | Status: DC | PRN

## 2021-05-02 MED ORDER — HYDRALAZINE HCL 20 MG/ML IJ SOLN: 10.0000 mg | INTRAMUSCULAR | Status: DC | PRN

## 2021-05-02 MED ORDER — METOPROLOL SUCCINATE ER 25 MG PO TB24
25.0000 mg | ORAL_TABLET | Freq: Every day | ORAL | Status: DC
  Administered 2021-05-02 – 2021-05-03 (×2): 25 mg via ORAL
  Filled 2021-05-02 (×2): qty 1

## 2021-05-02 MED ORDER — ONDANSETRON HCL 4 MG PO TABS: 4.0000 mg | ORAL_TABLET | Freq: Four times a day (QID) | ORAL | Status: DC | PRN

## 2021-05-02 MED ORDER — LABETALOL HCL 5 MG/ML IV SOLN
20.0000 mg | Freq: Once | INTRAVENOUS | Status: AC
  Administered 2021-05-02: 20 mg via INTRAVENOUS
  Filled 2021-05-02: qty 4

## 2021-05-02 MED ORDER — INFLUENZA VAC SPLIT QUAD 0.5 ML IM SUSY
0.5000 mL | PREFILLED_SYRINGE | INTRAMUSCULAR | Status: AC
  Administered 2021-05-03: 0.5 mL via INTRAMUSCULAR
  Filled 2021-05-02: qty 0.5

## 2021-05-02 MED ORDER — ACETAMINOPHEN 650 MG RE SUPP: 650.0000 mg | Freq: Four times a day (QID) | RECTAL | Status: DC | PRN

## 2021-05-02 NOTE — ED Provider Notes (Signed)
[facility] Provider Note  CSN: [id number] Arrival date & time: 05/02/21 0445  Chief Complaint(s) Headache  HPI [facility] is a 35 y.o. female   The history is provided by the patient.  Headache Pain location:  Generalized Quality: squeezing. Duration:  5 hours Timing:  Constant Progression:  Waxing and waning Chronicity:  Recurrent Similar to prior headaches: yes   Context comment:  Recent URI and took OTC decongestants Relieved by:  Nothing Worsened by:  Nothing Ineffective treatments:  Acetaminophen Associated symptoms: congestion and nausea   Associated symptoms: no blurred vision, no dizziness, no facial pain, no fatigue, no fever, no photophobia, no visual change and no vomiting    Past Medical History Past Medical History:  Diagnosis Date   Allergy    Bipolar disorder (HCC) 2014   Cardiomyopathy (HCC)    a. Echo (10/15):  EF 35-40%, mild MR, mild to moderately reduced RV systolic function, PASP 31 mmHg  ; b.  Echo (12/15):  Posterior basal HK, mild LVH, EF 55%, mild LAE.   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 Authorizing Provider  acetaminophen (TYLENOL) 500 MG tablet Take 1,000 mg by mouth every 6 (six) hours as needed for moderate pain or headache.    Yes [provider]  FLUoxetine (PROZAC) 20 MG capsule Take 20 mg by mouth daily.   Yes [provider]  guaiFENesin (MUCINEX PO) Take 20 mLs by mouth 2 (two) times daily as needed (congestion).   Yes [provider]  levonorgestrel (MIRENA) 20 MCG/24HR IUD 1 each by Intrauterine route once.   Yes [provider]  Nutritional Supplements (COLD AND FLU PO) Take 1 capsule by mouth 2 (two) times daily as needed (cold symtpms).   Yes [provider]  Phenylephrine-Pheniramine-DM [facility] COLD & COUGH PO) Take 1 Dose by mouth 2 (two) times daily as needed (cold and cough).   Yes [provider]  traZODone (DESYREL) 50 MG tablet Take 50 mg by mouth at bedtime as needed for sleep. 12/08/19  Yes [provider]  metoprolol succinate (TOPROL-XL) 25 MG 24 hr tablet Take 1 tablet (25 mg total) by mouth daily. Take with or immediately following a meal. Patient not taking: Reported on 05/02/2021 03/18/20 03/13/21  Parke Poisson, MD  Past Surgical History Past Surgical History:  Procedure Laterality Date   ADENOIDECTOMY     CESAREAN SECTION N/A 01/17/2014   Procedure: CESAREAN SECTION;  Surgeon: Brock Bad, MD;  Location: [facility];  Service: Obstetrics;  Laterality: N/A;   DILATION AND CURETTAGE OF UTERUS     Family History Family History  Problem Relation Age of Onset   Hypertension Mother    Heart attack Mother        received stent in her early 81s   Hypertension Father    Hypertension Brother    Hypertension Maternal Grandmother    Hypertension Maternal Grandfather    Hypertension Paternal Grandmother    Hypertension Paternal Grandfather     Social History Social History   Tobacco Use   Smoking status: Every Day    Packs/day: 0.50    Years: 5.00    Pack years: 2.50    Types: Cigarettes    Smokeless tobacco: Never  Vaping Use   Vaping Use: Never used  Substance Use Topics   Alcohol use: Yes    Comment: socially   Drug use: No   Allergies Lisinopril  Review of Systems Review of Systems  Constitutional:  Negative for fatigue and fever.  HENT:  Positive for congestion.   Eyes:  Negative for blurred vision and photophobia.  Gastrointestinal:  Positive for nausea. Negative for vomiting.  Neurological:  Positive for headaches. Negative for dizziness.  All other systems are reviewed and are negative for acute change except as noted in the HPI  Physical Exam Vital Signs  I have reviewed the triage vital signs BP (!) 141/97   Pulse 70   Temp 98.4 F (36.9 C)   Resp (!) 21   Ht 5\' 5"  (1.651 m)   Wt 111.1 kg   SpO2 97%   BMI 40.77 kg/m   Physical Exam Vitals reviewed.  Constitutional:      General: She is not in acute distress.    Appearance: She is well-developed. She is not diaphoretic.  HENT:     Head: Normocephalic and atraumatic.     Nose: Nose normal.  Eyes:     General: No scleral icterus.       Right eye: No discharge.        Left eye: No discharge.     Conjunctiva/sclera: Conjunctivae normal.     Pupils: Pupils are equal, round, and reactive to light.  Cardiovascular:     Rate and Rhythm: Normal rate and regular rhythm.     Heart sounds: No murmur heard.   No friction rub. No gallop.  Pulmonary:     Effort: Pulmonary effort is normal. No respiratory distress.     Breath sounds: Normal breath sounds. No stridor. No rales.  Abdominal:     General: There is no distension.     Palpations: Abdomen is soft.     Tenderness: There is no abdominal tenderness.  Musculoskeletal:        General: No tenderness.     Cervical back: Normal range of motion and neck supple.  Skin:    General: Skin is warm and dry.     Findings: No erythema or rash.  Neurological:     Mental Status: She is alert and oriented to person, place, and time.     Comments: Mental  Status:  Alert and oriented to person, place, and time.  Attention and concentration normal.  Speech clear.  Recent memory is intact  Cranial Nerves:  II Visual  Fields: Intact to confrontation. Visual fields intact. III, IV, VI: Pupils equal and reactive to light and near. Full eye movement without nystagmus  V Facial Sensation: Normal. No weakness of masticatory muscles  VII: No facial weakness or asymmetry  VIII Auditory Acuity: Grossly normal  IX/X: The uvula is midline; the palate elevates symmetrically  XI: Normal sternocleidomastoid and trapezius strength  XII: The tongue is midline. No atrophy or fasciculations.   Motor System: Muscle Strength: 5/5 and symmetric in the upper and lower extremities. No pronation or drift.  Muscle Tone: Tone and muscle bulk are normal in the upper and lower extremities.  Reflexes:  No Clonus Coordination: Intact finger-to-nose. No tremor.  Sensation: Intact to light touch. Gait: Routine gait normal.     ED Results and Treatments Labs (all labs ordered are listed, but only abnormal results are displayed) Labs Reviewed  COMPREHENSIVE METABOLIC PANEL - Abnormal; Notable for the following components:      Result Value   Glucose, Bld 114 (*)    Creatinine, Ser 1.02 (*)    All other components within normal limits  TROPONIN I (HIGH SENSITIVITY) - Abnormal; Notable for the following components:   Troponin I (High Sensitivity) 20 (*)    All other components within normal limits  SARS CORONAVIRUS 2 (TAT 6-24 HRS)  CBC  BRAIN NATRIURETIC PEPTIDE  I-STAT BETA HCG BLOOD, ED ([facility], [facility], [facility] ONLY)  TROPONIN I (HIGH SENSITIVITY)                                                                                                                         EKG  EKG Interpretation  Date/Time:  Saturday May 02 2021 04:44:48 EDT Ventricular Rate:  75 PR Interval:  96 QRS Duration: 102 QT Interval:  398 QTC Calculation: 444 R Axis:   58 Text  Interpretation: Sinus rhythm with short PR Marked ST abnormality, possible inferolateral subendocardial injury Abnormal ECG Confirmed by Ewel Lona ([id number]) on 05/02/2021 5:02:55 AM       Radiology CT HEAD WO CONTRAST (<MEASUREMENTSan Juan Regional Rehabilitation Prudy Feel(93Sacred Heart HospitLNita Se87lRosOswego Community Hospit aUpmc Pinnacle Prudy Feel21Nix Health Care SystLNita Se42lRosKunesh Eye Surgery Cent eDavis Ambulatory SurgicaPrudy Feel23Manchester Ambulatory Surgery Grant LP Dba Des Peres Square Surgery CentLNita Se56lRosStafford County Hospit aWahiawa General Prudy Feel50Three Rivers Surgical Care LNita Se75lRosLiberty Eye Surgical Grant L LAdvanced Specialty Hospital OPrudy Feel(304Essentia Health ALNita Se46lRosBlessing Hospit aHill Regional Prudy Feel21Ascension [facility] Grant HaLNita Se92lRosThe Addiction Institute Of New Yo rRiverview Surgical CePrudy Feel(607Big Spring State HospitLNita Se59lRosA M Surgery Cent eMayo Clinic HealtPrudy Feel(606Ascension Providence Health CentLNita Se16lRosDiamond Grove Cent eCloud County HealtPrudy Feel22Endo Surgical Grant Of North JersLNita Se42lRosGriffin Memorial Hospit aBrentwood Prudy Feel70Palestine Regional Rehabilitation And Psychiatric CampLNita Se73lRosCardinal Hill Rehabilitation Hospit aSibley Memorial Prudy Feel(66Crete Area Medical CentLNita Se49lRosGeisinger Endoscopy Montoursvil lThe Surgery Grant Of Alta Bates Summit Medical CePrudy Feel70Coshocton County Memorial HospitLNita Se98lRosGso Equipment Corp Dba The [facility] Clinic Endoscopy Grant Newbe rHolmes Regional MedicaPrudy Feel54Geisinger-Bloomsburg HospitLNita Se30lRosAtrium Medical Cent eMilford Regional MedicaPrudy Feel43Sinai Hospital Of BaltimoLNita Se35lRosSt. Luke'S Regional Medical Cent eCopley Prudy Feel43Prisma Health Baptist Easley HospitLNita Se24lRosVa Eastern Colorado Healthcare Syst eCoral Desert Surgery CePrudy Feel57Desert Ridge Outpatient Surgery CentLNita Se34lRosCamc Women And Children'S Hospit aBrooke Glen Behavioral Prudy Feel70Marshfield Medical Grant LadysmiLNita Se88lRosBeloit Health Syst eDevereux Childrens Behavioral HealtPrudy Feel41Optima Specialty HospitLNita Se49lRosCirby Hills Behavioral Heal tEastern Orange Ambulatory Surgery CePrudy Feel(867New Vision Surgical Grant LLNita Se15lRosSelect Specialty Hospital - Savann aAnthony M Yelencsics CPrudy Feel77North Valley Surgery CentLNita Se31lRosWesterville Medical Camp uSurgery Grant Of BraPrudy Feel91Washington Dc Va Medical CentLNita Se89lRosGuidance Grant, T hHoly Cross Prudy Feel(431Patrick B Harris Psychiatric HospitLNita Se58lRosOlympia Eye Clinic Inc PFranciscan Healthcare RPrudy FeelPiedmont Newton HospitLNita Se59lRosAscension Borgess-Lee Memorial Hospit aMedstar Surgery Grant At [place] CePrudy Feel60Uc Medical Grant PsychiatrLNita Se75lRosCommunity Memorial Hsp tGadsden Regional MedicaPrudy Feel(71Carrus Rehabilitation HospitLNita Se62lRosNorthwest Medical Grant - Bentonvil lEphraim Mcdowell Regional MedicaPrudy Feel75Merit Health RankLNita Se44lRosSafety Harbor Asc Company LLC Dba Safety Harbor Surgery Cent eSt Petersburg Endoscopy CePrudy Feel77Eastern Long Island HospitLNita Se40lRosColumbia Eye And Specialty Surgery Grant L tHoly Rosary HePrudy Feel(58St Joseph'S HospitLNita Se42lRosIntegris Health Edmo nCarrolltonPrudy Feel(270[facility] Doctor Dan C Trigg Memorial HospitLNita Se1lRosHudson Valley Endoscopy Cent eSebasticook Valley Prudy Feel33Woolfson Ambulatory Surgery Grant LLNita Se44lRosFry Eye Surgery Grant L LSan Diego EndoscopPrudy FeelOmega HospitLNita Se75lRosTomah Mem Hsp tMercy Hospital WaPrudy FeelMercy Health MuskegLNita SeSt. John Owas sDigestive Health Grant Of HuPrudy Feel87J. D. Mccarty Grant For Children With Developmental DisabilitiLNita Se54lRosMartin General Hospit aColima Endoscopy CePrudy Feel(743Total Eye Care Surgery Grant ILNita Se64lRosLegacy Salmon Creek Medical Cent eCopley Prudy Feel(71Encompass Health Rehabilitation Hospital At Martin HealLNita Se92lRosMedstar Good Samaritan Hospit aRochester PsychiatriPrudy Feel57The Surgery Grant At CranberLNita Se92lRosCenter For Same Day Surge rNorth Hawaii Community Prudy Feel43Facey Medical FoundatiLNita Se35lRosUniversity Of Ky Hospit aMercy Medical Grant-CenPrudy Feel(20Healtheast St Johns HospitLNita Se75lRosEncompass Health Rehabilitation Hospital Of Charlest oSt Anthony Community Prudy Feel65Encompass Health Rehabilitation HospitLNita Se7lRosWills Memorial Hospit aBlueridge Vista Health And Prudy FeelThe Surgery Grant At Northbay Vaca VallLNita Se23lRosTirr Memorial Herma nDekalb Regional MedicaPrudy Feel26Presence Saint Joseph HospitLNita Se53lRosSoutheasthealth Grant Of Ripley Countylie Gums6 Date: 05/02/2021 CLINICAL DATA:  Headache that awoke the patient from sleep EXAM: CT HEAD WITHOUT CONTRAST TECHNIQUE: Contiguous axial images were obtained from the base of the skull through the vertex without intravenous contrast. COMPARISON:  None. FINDINGS: Brain: No evidence of acute infarction, hemorrhage, hydrocephalus, extra-axial collection or mass lesion/mass effect. Vascular: No hyperdense vessel or unexpected calcification. Skull: Normal. Negative for fracture or focal lesion. Sinuses/Orbits: Mucosal thickening in the inferior maxillary sinuses with polypoid structures usually from retention cysts. Secretions in the left sphenoid sinus without mucosal thickening. IMPRESSION: 1. Negative intracranial imaging. 2. Bilateral sinus opacification. Electronically Signed   By: Jonathan  Watts M.D.   On: 05/02/2021 07:57   DG Chest Port 1 View  Result Date: 05/02/2021 CLINICAL DATA:  Chest pain EXAM: PORTABLE CHEST 1 VIEW COMPARISON:  09/30/2020 FINDINGS: Stable generous left ventricular contour. Unremarkable aortic and hilar contours. There is no edema, consolidation, effusion, or pneumothorax.  No acute osseous finding. IMPRESSION: Stable exam.  No evidence of acute disease. Electronically Signed   By: Tiburcio Pea M.D.   On: 05/02/2021 05:35    Pertinent labs & imaging results that were available during my care of the patient were reviewed by me and considered in my medical decision making (see MDM for details).  Medications Ordered in ED Medications  diphenhydrAMINE (BENADRYL) injection 25 mg (25 mg Intravenous Given 05/02/21 0606)  prochlorperazine (COMPAZINE) injection 10 mg (10 mg Intravenous Given 05/02/21 0606)  fentaNYL (SUBLIMAZE) injection 50 mcg (50  mcg Intravenous Given 05/02/21 0606)  labetalol (NORMODYNE) injection 20 mg (20 mg Intravenous Given 05/02/21 0813)                                                                                                                                     Procedures .1-3 Lead EKG Interpretation Performed by: Nira Conn, MD Authorized by: Nira Conn, MD     Interpretation: normal     ECG rate:  78   ECG rate assessment: normal     Rhythm: sinus rhythm     Ectopy: none     Conduction: normal   .Critical Care Performed by: Nira Conn, MD Authorized by: Nira Conn, MD   Critical care provider statement:    Critical care time (minutes):  45   Critical care was necessary to treat or prevent imminent or life-threatening deterioration of the following conditions:  Cardiac failure   Critical care was time spent personally by me on the following activities:  Discussions with consultants, evaluation of patient's response to treatment, examination of patient, ordering and performing treatments and interventions, ordering and review of laboratory studies, ordering and review of radiographic studies, pulse oximetry, re-evaluation of patient's condition, obtaining history from patient or surrogate and review of old charts   Care discussed with: admitting provider    (including critical care time)  Medical Decision Making / ED Course I have reviewed the nursing notes for this encounter and the patient's prior records (if available in EHR or on provided paperwork).  RUDENE POULSEN was evaluated in Emergency Department on 05/02/2021 for the symptoms described in the history of present illness. She was evaluated in the context of the global COVID-19 pandemic, which necessitated consideration that the patient might be at risk for infection with the SARS-CoV-2 virus that causes COVID-19. Institutional protocols and algorithms that pertain to the evaluation of patients  at risk for COVID-19 are in a state of rapid change based on information released by regulatory bodies including the CDC and federal and state organizations. These policies and algorithms were followed during the patient's care in the ED.     Patient presents with severe headache. Patient severely hypertensive No neuro deficits on exam Will get CT to rule out [facility] or ICH. Labs to assess for organ damage due to hypertensive emergency.  Pertinent labs &  imaging results that were available during my care of the patient were reviewed by me and considered in my medical decision making:  EKG notable for diffuse T wave inversions. New when compared to previous. Patient denies any chest pain or shortness of breath. Initial troponin mildly elevated. Mild renal sufficiency without AKI. BnP negative.  CT head without ICH or subarachnoid hemorrhage.  Patient treated with migraine cocktail provided significant relief. Blood pressure still not significantly improved. With the elevated troponin and new T wave changes on EKG Will admit patient for further work-up and management.  Final Clinical Impression(s) / ED Diagnoses Final diagnoses:  Chest pain  Hypertensive crisis     This chart was dictated using voice recognition software.  Despite best efforts to proofread,  errors can occur which can change the documentation meaning.    Nira Conn, MD 05/02/21 [phone]

## 2021-05-02 NOTE — ED Triage Notes (Signed)
Pt states she was sleeping and her headache woke her up and was nauseated. Pt states she felt like her heart was racing. Pt states she had this before years ago and had to be "shocked out of it". Pt states she feels like tachycardia is improving at this time.

## 2021-05-02 NOTE — H&P (Addendum)
History and Physical    Tracy Grant CBJ:[id number] DOB: 01-04-1986 DOA: 05/02/2021  Referring MD/NP/PA: Drema Pry, MD PCP: Tracy Mares, PA  Patient coming from: home  Chief Complaint: Headache  I have personally briefly reviewed patient's old medical records in [facility]   HPI: Tracy Grant is a 35 y.o. 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  Last night patient reports waking up 3-4 times to a squeezing and throbbing pain all over her head.  Patient reports that is not uncommon for her to get headaches, but never had been this severe before.  She reported associated symptoms of chest palpitations, nausea, hot/cold sweats for which she became more concerned.  She had not been on blood pressure medications after she reported quitting smoking back in December of last year.  Patient reported that her blood pressures were improved and she just recently had a follow-up visit with her primary care provider last week where it was 130s /70s.  Earlier in the week patient had a cold with congestion for which she had been taking over-the-counter medications like Mucinex possibly with decongestants.  Reports being tested for COVID negative at that time.  Records note patient had previously been seen by cardiology back in 03/2020 and at that time had been recommended to continue amlodipine 10 mg daily with metoprolol succinate 25 mg daily.  ED Course: Upon admission into the emergency department patient was noted to be afebrile, respirations 13-26, blood pressure elevated up to 215/124, and all other vital signs maintained.  Labs significant for HS-troponin 20.  Chest x-ray showed no acute abnormality.  CT scan of the brain did not note any acute abnormalities.  EKG was noted to show flipped T waves.  Patient had been given Benadryl 25 mg IV, fentanyl 50  mcg Compazine 10 mg IV, and labetalol 20 mg IV x1 dose.  COVID-19 screening was still pending.  Review of Systems  Constitutional:  Positive for diaphoresis. Negative for fever.  HENT:  Positive for congestion. Negative for nosebleeds.   Eyes:  Negative for double vision and photophobia.  Respiratory:  Positive for cough. Negative for shortness of breath.   Cardiovascular:  Positive for palpitations. Negative for chest pain and leg swelling.  Gastrointestinal:  Positive for nausea. Negative for vomiting.  Genitourinary:  Negative for dysuria and hematuria.  Musculoskeletal:  Negative for falls.  Skin:  Negative for rash.  Neurological:  Positive for headaches. Negative for focal weakness and loss of consciousness.  Psychiatric/Behavioral:  Negative for substance abuse. The patient has insomnia.    Past Medical History:  Diagnosis Date   Allergy    Bipolar disorder (HCC) 2014   Cardiomyopathy (HCC)    a. Echo (10/15):  EF 35-40%, mild MR, mild to moderately reduced RV systolic function, PASP 31 mmHg  ; b.  Echo (12/15):  Posterior basal HK, mild LVH, EF 55%, mild LAE.   Headache(784.0)    History of chicken pox    Hx of migraines    Hyperlipidemia    Hypertension    Infection    trich    Past Surgical History:  Procedure Laterality Date   ADENOIDECTOMY     CESAREAN SECTION N/A 01/17/2014   Procedure: CESAREAN SECTION;  Surgeon: Brock Bad, MD;  Location: [facility];  Service: Obstetrics;  Laterality: N/A;   DILATION AND CURETTAGE OF UTERUS       reports that  she has been smoking cigarettes. She has a 2.50 pack-year smoking history. She has never used smokeless tobacco. She reports current alcohol use. She reports that she does not use drugs.  Allergies  Allergen Reactions   Lisinopril Swelling    Family History  Problem Relation Age of Onset   Hypertension Mother    Heart attack Mother        received stent in her early 60s   Hypertension Father    Hypertension  Brother    Hypertension Maternal Grandmother    Hypertension Maternal Grandfather    Hypertension Paternal Grandmother    Hypertension Paternal Grandfather     Prior to Admission medications   Medication Sig Start Date End Date Taking? Authorizing Provider  acetaminophen (TYLENOL) 500 MG tablet Take 1,000 mg by mouth every 6 (six) hours as needed for moderate pain or headache.   Yes [provider]  FLUoxetine (PROZAC) 20 MG capsule Take 20 mg by mouth daily.   Yes [provider]  guaiFENesin (MUCINEX PO) Take 20 mLs by mouth 2 (two) times daily as needed (congestion).   Yes [provider]  levonorgestrel (MIRENA) 20 MCG/24HR IUD 1 each by Intrauterine route once.   Yes [provider]  Nutritional Supplements (COLD AND FLU PO) Take 1 capsule by mouth 2 (two) times daily as needed (cold symtpms).   Yes [provider]  Phenylephrine-Pheniramine-DM [facility] COLD & COUGH PO) Take 1 Dose by mouth 2 (two) times daily as needed (cold and cough).   Yes [provider]  traZODone (DESYREL) 50 MG tablet Take 50 mg by mouth at bedtime as needed for sleep. 12/08/19  Yes [provider]  metoprolol succinate (TOPROL-XL) 25 MG 24 hr tablet Take 1 tablet (25 mg total) by mouth daily. Take with or immediately following a meal. Patient not taking: Reported on 05/02/2021 03/18/20 03/13/21  Parke Poisson, MD    Physical Exam:  Constitutional: Young female currently in no acute distress Vitals:   05/02/21 0645 05/02/21 0717 05/02/21 0815 05/02/21 0817  BP: (!) 172/96 (!) 183/104 (!) 141/97   Pulse: 77 84 70   Resp: (!) 21 (!) 26 (!) 21   Temp:      SpO2: 96% 95% 97%   Weight:    111.1 kg  Height:    5\' 5"  (1.651 m)   Eyes: PERRL, lids and conjunctivae normal ENMT: Mucous membranes are moist. Posterior pharynx clear of any exudate or lesions.  Neck: normal, supple, no masses, no thyromegaly.  No JVD Respiratory: clear to auscultation  bilaterally, no wheezing, no crackles. Normal respiratory effort. No accessory muscle use.  Cardiovascular: Regular rate and rhythm, with  mild murmur appreciated.  No significant lower extremity swelling. Abdomen: no tenderness, no masses palpated. No hepatosplenomegaly. Bowel sounds positive.  Musculoskeletal: no clubbing / cyanosis. No joint deformity upper and lower extremities. Good ROM, no contractures. Normal muscle tone.  Skin: no rashes, lesions, ulcers. No induration Neurologic: CN 2-12 grossly intact. Sensation intact, DTR normal. Strength 5/5 in all 4.  Psychiatric: Normal judgment and insight. Alert and oriented x 3. Normal mood.     Labs on Admission: I have personally reviewed following labs and imaging studies  CBC: Recent Labs  Lab 05/02/21 0453  WBC 4.0  HGB 14.9  HCT 42.8  MCV 95.5  PLT 185   Basic Metabolic Panel: Recent Labs  Lab 05/02/21 0505  NA 137  K 4.3  CL 100  CO2 25  GLUCOSE  114*  BUN 9  CREATININE 1.02*  CALCIUM 10.0   GFR: Estimated Creatinine Clearance: 98.3 mL/min (A) (by C-G formula based on SCr of 1.02 mg/dL (H)). Liver Function Tests: Recent Labs  Lab 05/02/21 0505  AST 28  ALT 22  ALKPHOS 44  BILITOT 0.8  PROT 7.8  ALBUMIN 4.1   No results for input(s): LIPASE, AMYLASE in the last 168 hours. No results for input(s): AMMONIA in the last 168 hours. Coagulation Profile: No results for input(s): INR, PROTIME in the last 168 hours. Cardiac Enzymes: No results for input(s): CKTOTAL, CKMB, CKMBINDEX, TROPONINI in the last 168 hours. BNP (last 3 results) No results for input(s): PROBNP in the last 8760 hours. HbA1C: No results for input(s): HGBA1C in the last 72 hours. CBG: No results for input(s): GLUCAP in the last 168 hours. Lipid Profile: No results for input(s): CHOL, HDL, LDLCALC, TRIG, CHOLHDL, LDLDIRECT in the last 72 hours. Thyroid Function Tests: No results for input(s): TSH, T4TOTAL, FREET4, T3FREE, THYROIDAB in  the last 72 hours. Anemia Panel: No results for input(s): VITAMINB12, FOLATE, FERRITIN, TIBC, IRON, RETICCTPCT in the last 72 hours. Urine analysis:    Component Value Date/Time   COLORURINE YELLOW 03/22/2017 2209   APPEARANCEUR CLEAR 03/22/2017 2209   LABSPEC 1.023 03/22/2017 2209   PHURINE 5.0 03/22/2017 2209   GLUCOSEU NEGATIVE 03/22/2017 2209   HGBUR SMALL (A) 03/22/2017 2209   BILIRUBINUR NEGATIVE 03/22/2017 2209   BILIRUBINUR negative 08/27/2015 1253   BILIRUBINUR NEGATIVE 12/13/2013 1052   KETONESUR 5 (A) 03/22/2017 2209   PROTEINUR 30 (A) 03/22/2017 2209   UROBILINOGEN 1.0 08/27/2015 1253   UROBILINOGEN 0.2 01/16/2014 1115   NITRITE NEGATIVE 03/22/2017 2209   LEUKOCYTESUR NEGATIVE 03/22/2017 2209   Sepsis Labs: No results found for this or any previous visit (from the past 240 hour(s)).   Radiological Exams on Admission: CT HEAD WO CONTRAST ( )  Result Date: 05/02/2021 CLINICAL DATA:  Headache that awoke the patient from sleep EXAM: CT HEAD WITHOUT CONTRAST TECHNIQUE: Contiguous axial images were obtained from the base of the skull through the vertex without intravenous contrast. COMPARISON:  None. FINDINGS: Brain: No evidence of acute infarction, hemorrhage, hydrocephalus, extra-axial collection or mass lesion/mass effect. Vascular: No hyperdense vessel or unexpected calcification. Skull: Normal. Negative for fracture or focal lesion. Sinuses/Orbits: Mucosal thickening in the inferior maxillary sinuses with polypoid structures usually from retention cysts. Secretions in the left sphenoid sinus without mucosal thickening. IMPRESSION: 1. Negative intracranial imaging. 2. Bilateral sinus opacification. Electronically Signed   By: Tiburcio Pea M.D.   On: 05/02/2021 07:57   DG Chest Port 1 View  Result Date: 05/02/2021 CLINICAL DATA:  Chest pain EXAM: PORTABLE CHEST 1 VIEW COMPARISON:  09/30/2020 FINDINGS: Stable generous left ventricular contour. Unremarkable aortic and  hilar contours. There is no edema, consolidation, effusion, or pneumothorax. No acute osseous finding. IMPRESSION: Stable exam.  No evidence of acute disease. Electronically Signed   By: Tiburcio Pea M.D.   On: 05/02/2021 05:35    EKG: Independently reviewed.  Sinus rhythm at 75 bpm with flipped T wave and ST wave changes  Assessment/Plan Hypertensive urgency: Acute.  Patient presents with blood pressures initially elevated up to 215/124.  She reports stopping blood pressure medications.  Records from cardiology note recommending continuing amlodipine 10 mg daily with the addition of metoprolol succinate 25 mg daily.  Patient had been given 20 mg of labetalol IV. -Admit to a cardiac telemetry bed -Resume previous amlodipine and metoprolol  -Hydralazine IV  as needed for elevated blood pressure  Palpitations history of atrial flutter: Patient reported complaints of palpitations.  She was noted to to be in atrial flutter when initially found to have reduced heart function back in 2015 thought to be secondary to postpartum cardiomyopathy versus arrhythmia.  Patient had py worn a Holter monitor for  -14 days in June 2021.  Study noted no atrial fibrillation or flutter, but note one 4 beat episode of NSVT. -Follow-up telemetry overnight  Elevated troponin  history of postpartum cardiomyopathy: On admission patient was found to have elevated high-sensitivity troponin at 20.  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  Patient present with complaints of headache which woke her out of her sleep.  CT scan of the head negative for any acute intracranial  abnormality, but did note bilaterals sinus opacification.  Patient did report headache improved after initial therapies and improvement of blood pressure, but reports headache has returned and blood pressure currently 170s/110.  Suspect headaches likely related with uncontrolled hypertension. -Fioricet as needed for headache -Mucinex as needed for congestion without decongestant  History of bipolar disorder -Continue Prozac  Morbid obesity: BMI 40.77 kg/ m -Continue to counsel on need of dietary and lifestyle modifications  DVT prophylaxis: Lovenox Code Status: Full  Family Communication: No family requested to be updated Disposition Plan: Home Consults called: None Admission status: Observation, requiring less than 2 midnight stay  Clydie Braun MD [facility]   If 7PM-7AM, please contact night-coverage   05/02/2021, 8:28 AM

## 2021-05-03 ENCOUNTER — Observation Stay (HOSPITAL_BASED_OUTPATIENT_CLINIC_OR_DEPARTMENT_OTHER): Payer: BC Managed Care – PPO

## 2021-05-03 DIAGNOSIS — I160 Hypertensive urgency: Secondary | ICD-10-CM

## 2021-05-03 DIAGNOSIS — Z8659 Personal history of other mental and behavioral disorders: Secondary | ICD-10-CM | POA: Diagnosis not present

## 2021-05-03 DIAGNOSIS — R519 Headache, unspecified: Secondary | ICD-10-CM | POA: Diagnosis not present

## 2021-05-03 DIAGNOSIS — R778 Other specified abnormalities of plasma proteins: Secondary | ICD-10-CM

## 2021-05-03 DIAGNOSIS — Z8679 Personal history of other diseases of the circulatory system: Secondary | ICD-10-CM | POA: Diagnosis not present

## 2021-05-03 LAB — CBC
HCT: 40.6 % (ref 36.0–46.0)
Hemoglobin: 14.2 g/dL (ref 12.0–15.0)
MCH: 33.7 pg (ref 26.0–34.0)
MCHC: 35 g/dL (ref 30.0–36.0)
MCV: 96.4 fL (ref 80.0–100.0)
Platelets: 188 10*3/uL (ref 150–400)
RBC: 4.21 MIL/uL (ref 3.87–5.11)
RDW: 11.9 % (ref 11.5–15.5)
WBC: 4.7 10*3/uL (ref 4.0–10.5)
nRBC: 0 % (ref 0.0–0.2)

## 2021-05-03 LAB — BASIC METABOLIC PANEL
Anion gap: 10 (ref 5–15)
BUN: 10 mg/dL (ref 6–20)
CO2: 25 mmol/L (ref 22–32)
Calcium: 9.6 mg/dL (ref 8.9–10.3)
Chloride: 101 mmol/L (ref 98–111)
Creatinine, Ser: 1.01 mg/dL — ABNORMAL HIGH (ref 0.44–1.00)
GFR, Estimated: >60 mL/min (ref >60)
Glucose, Bld: 99 mg/dL (ref 70–99)
Potassium: 4 mmol/L (ref 3.5–5.1)
Sodium: 136 mmol/L (ref 135–145)

## 2021-05-03 LAB — ECHOCARDIOGRAM COMPLETE
Area-P 1/2: 2.94 cm2
Height: 65 in
MV M vel: 5.22 m/s
MV Peak grad: 109 mmHg
S' Lateral: 2.7 cm
Weight: 3880.1 oz

## 2021-05-03 MED ORDER — BUTALBITAL-APAP-CAFFEINE 50-325-40 MG PO TABS: 1.0000 | ORAL_TABLET | Freq: Four times a day (QID) | ORAL | 0 refills | Status: AC | PRN

## 2021-05-03 MED ORDER — METOPROLOL SUCCINATE ER 25 MG PO TB24: 25.0000 mg | ORAL_TABLET | Freq: Every day | ORAL | 0 refills | Status: AC

## 2021-05-03 MED ORDER — BLOOD PRESSURE KIT: PACK | 0 refills | Status: AC

## 2021-05-03 MED ORDER — COVID-19 MRNA VAC-TRIS(PFIZER) 30 MCG/0.3ML IM SUSP: 0.3000 mL | Freq: Once | INTRAMUSCULAR | Status: DC

## 2021-05-03 MED ORDER — AMLODIPINE BESYLATE 10 MG PO TABS: 10.0000 mg | ORAL_TABLET | Freq: Every day | ORAL | 0 refills | Status: DC

## 2021-05-03 NOTE — Discharge Summary (Signed)
Discharge Summary  Tracy Grant:[id number] DOB: 1986-02-04  PCP: Scheryl Marten, PA  Admit date: 05/02/2021 Discharge date: 05/03/2021  Time spent: 30 mins  Recommendations for Outpatient Follow-up:  Follow up with PCP in 1 week   Discharge Diagnoses:  Active Hospital Problems   Diagnosis Date Noted   Hypertensive urgency 05/02/2021   Elevated troponin 05/02/2021   History of cardiomyopathy 05/02/2021   Palpitations 05/02/2021   History of atrial flutter 05/02/2021   Headache 05/02/2021   History of bipolar disorder 05/02/2021   Morbid obesity ([facility]) 10/21/2014    Resolved Hospital Problems  No resolved problems to display.    Discharge Condition: Stable  Diet recommendation: Heart healthy  Vitals:   05/03/21 0939 05/03/21 1322  BP: (!) 152/98 (!) 155/101  Pulse: 75 71  Resp:  16  Temp: 97.9 F (36.6 C) 98 F (36.7 C)  SpO2: 99% 99%    History of present illness:  Tracy Grant is a 35 y.o. 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 Pt had not been on blood pressure medications after she reported quitting smoking back in December of last year.  Patient reported that her blood pressures were improved and she just recently had a follow-up visit with her primary care provider last week where it was 130s /70s.  Earlier in the week patient had a cold with congestion for which she had been taking over-the-counter medications like Mucinex possibly with decongestants. Records note patient had previously been seen by cardiology back in 03/2020 and at that time had been recommended to continue amlodipine 10 mg daily with metoprolol succinate 25 mg daily. In the ED, BP noted to be elevated to 215/124, and all other vital signs maintained. Chest x-ray showed no acute abnormality.  CT scan of the brain did not note any acute abnormalities.   EKG was noted to show flipped T waves. Pt admitted for further management.   Today, pt reported feeling better, headache has improved, denies any chest pain, N/V, SOB. PT extensively advised to be compliant with BP medications, check BP regularly, and lifestyle modifications.    Hospital Course:  Principal Problem:   Hypertensive urgency Active Problems:   Morbid obesity (HCC)   Elevated troponin   History of cardiomyopathy   Palpitations   History of atrial flutter   Headache   History of bipolar disorder   Hypertensive crisis On admission, BP elevated to 215/124, now with much better control upon discharge  Advised to be compliant with BP meds Continue amlodipine, metoprolol Home BP monitoring  Headache  Improved Likely 2/2 above CT head unremarkable Fioricet prn  Palpitations history of atrial flutter HR controlled in NSR ECHO showed EF of 55-60%, no RWMA Follow up with outpt cardiology   History of postpartum cardiomyopathy Appears stable ECHO showed above   History of bipolar disorder Continue Prozac   Morbid obesity BMI 40.77 kg/ m Lifestyle modifications advised       Estimated body mass index is 40.36 kg/m as calculated from the following:   Height as of this encounter: 5' 5" (1.651 m).   Weight as of this encounter: 110 kg.    Procedures: None  Consultations: None  Discharge Exam: BP (!) 155/101 (BP Location: Left Arm)   Pulse 71   Temp 98 F (36.7 C) (Oral)   Resp 16   Ht 5' 5" (1.651 m)   Wt 110 kg  SpO2 99%   BMI 40.36 kg/m   General: NAD  Cardiovascular: S1, S2 present Respiratory: CTAB Abdomen: Soft, nontender, nondistended, bowel sounds present Musculoskeletal: No bilateral pedal edema noted Skin: Normal Psychiatry: Normal mood    Discharge Instructions You were cared for by a hospitalist during your hospital stay. If you have any questions about your discharge medications or the care you received while you were  in the hospital after you are discharged, you can call the unit and asked to speak with the hospitalist on call if the hospitalist that took care of you is not available. Once you are discharged, your primary care physician will handle any further medical issues. Please note that NO REFILLS for any discharge medications will be authorized once you are discharged, as it is imperative that you return to your primary care physician (or establish a relationship with a primary care physician if you do not have one) for your aftercare needs so that they can reassess your need for medications and monitor your lab values.   Allergies as of 05/03/2021       Reactions   Lisinopril Swelling        Medication List     STOP taking these medications    COLD AND FLU PO   THERAFLU COLD & COUGH PO       TAKE these medications    acetaminophen 500 MG tablet Commonly known as: TYLENOL Take 1,000 mg by mouth every 6 (six) hours as needed for moderate pain or headache.   amLODipine 10 MG tablet Commonly known as: NORVASC Take 1 tablet (10 mg total) by mouth daily. Start taking on: May 04, 2021   Blood Pressure Kit Check BP daily   butalbital-acetaminophen-caffeine 50-325-40 MG tablet Commonly known as: FIORICET Take 1 tablet by mouth every 6 (six) hours as needed for up to 7 days for headache.   FLUoxetine 20 MG capsule Commonly known as: PROZAC Take 20 mg by mouth daily.   levonorgestrel 20 MCG/24HR IUD Commonly known as: MIRENA 1 each by Intrauterine route once.   metoprolol succinate 25 MG 24 hr tablet Commonly known as: TOPROL-XL Take 1 tablet (25 mg total) by mouth daily. Start taking on: May 04, 2021 What changed: additional instructions   MUCINEX PO Take 20 mLs by mouth 2 (two) times daily as needed (congestion).   traZODone 50 MG tablet Commonly known as: DESYREL Take 50 mg by mouth at bedtime as needed for sleep.       Allergies  Allergen Reactions    Lisinopril Swelling    Follow-up Information     [facility], [place], [place]. Schedule an appointment as soon as possible for a visit in 1 week(s).   Specialty: Internal Medicine Contact information: [place]  [address] [phone]                  The results of significant diagnostics from this hospitalization (including imaging, microbiology, ancillary and laboratory) are listed below for reference.    Significant Diagnostic Studies: CT HEAD WO CONTRAST (5MM)  Result Date: 05/02/2021 CLINICAL DATA:  Headache that awoke the patient from sleep EXAM: CT HEAD WITHOUT CONTRAST TECHNIQUE: Contiguous axial images were obtained from the base of the skull through the vertex without intravenous contrast. COMPARISON:  None. FINDINGS: Brain: No evidence of acute infarction, hemorrhage, hydrocephalus, extra-axial collection or mass lesion/mass effect. Vascular: No hyperdense vessel or unexpected calcification. Skull: Normal. Negative for fracture or focal lesion. Sinuses/Orbits: Mucosal  thickening in the inferior maxillary sinuses with polypoid structures usually from retention cysts. Secretions in the left sphenoid sinus without mucosal thickening. IMPRESSION: 1. Negative intracranial imaging. 2. Bilateral sinus opacification. Electronically Signed   By: Jorje Guild M.D.   On: 05/02/2021 07:57   DG Chest Port 1 View  Result Date: 05/02/2021 CLINICAL DATA:  Chest pain EXAM: PORTABLE CHEST 1 VIEW COMPARISON:  09/30/2020 FINDINGS: Stable generous left ventricular contour. Unremarkable aortic and hilar contours. There is no edema, consolidation, effusion, or pneumothorax. No acute osseous finding. IMPRESSION: Stable exam.  No evidence of acute disease. Electronically Signed   By: Jorje Guild M.D.   On: 05/02/2021 05:35   ECHOCARDIOGRAM COMPLETE  Result Date: 05/03/2021    ECHOCARDIOGRAM REPORT   Patient Name:   NEIDA ELLEGOOD [facility] Date of Exam: 05/03/2021 Medical  Rec #:  [id number]         Height:       65.0 in Accession #:    [id number]        Weight:       242.5 lb Date of Birth:  10-26-1985        BSA:          2.147 m Patient Age:    68 years          BP:           155/101 mmHg Patient Gender: F                 HR:           71 bpm. Exam Location:  Inpatient Procedure: 2D Echo, Cardiac Doppler and Color Doppler Indications:    Elevated Troponin  History:        Patient has prior history of Echocardiogram examinations, most                 recent 01/08/2020. Cardiomyopathy, Arrythmias:Atrial Flutter;                 Risk Factors:Hypertension and Dyslipidemia. Current Smoker and                 Morbid obesity. Anticoagulated. LVH (left ventricular                 hypertrophy).  Sonographer:    Alvino Chapel RCS Referring Phys: [id number] RONDELL A SMITH IMPRESSIONS  1. Left ventricular ejection fraction, by estimation, is 55 to 60%. The left ventricle has normal function. The left ventricle has no regional wall motion abnormalities. There is moderate concentric left ventricular hypertrophy. Left ventricular diastolic parameters are indeterminate.  2. Right ventricular systolic function is normal. The right ventricular size is normal. There is normal pulmonary artery systolic pressure. The estimated right ventricular systolic pressure is 03.5 mmHg.  3. The mitral valve is grossly normal. Trivial mitral valve regurgitation. No evidence of mitral stenosis.  4. The aortic valve is tricuspid. Aortic valve regurgitation is not visualized. No aortic stenosis is present.  5. The inferior vena cava is normal in size with greater than 50% respiratory variability, suggesting right atrial pressure of 3 mmHg. Comparison(s): No significant change from prior study. FINDINGS  Left Ventricle: Left ventricular ejection fraction, by estimation, is 55 to 60%. The left ventricle has normal function. The left ventricle has no regional wall motion abnormalities. The left ventricular internal cavity  size was normal in size. There is  moderate concentric left ventricular hypertrophy. Left ventricular diastolic parameters are indeterminate. Right Ventricle: The right ventricular size is  normal. No increase in right ventricular wall thickness. Right ventricular systolic function is normal. There is normal pulmonary artery systolic pressure. The tricuspid regurgitant velocity is 1.58 m/s, and  with an assumed right atrial pressure of 3 mmHg, the estimated right ventricular systolic pressure is 78.2 mmHg. Left Atrium: Left atrial size was normal in size. Right Atrium: Right atrial size was normal in size. Pericardium: Trivial pericardial effusion is present. Mitral Valve: The mitral valve is grossly normal. Trivial mitral valve regurgitation. No evidence of mitral valve stenosis. Tricuspid Valve: The tricuspid valve is grossly normal. Tricuspid valve regurgitation is trivial. No evidence of tricuspid stenosis. Aortic Valve: The aortic valve is tricuspid. Aortic valve regurgitation is not visualized. No aortic stenosis is present. Pulmonic Valve: The pulmonic valve was grossly normal. Pulmonic valve regurgitation is not visualized. No evidence of pulmonic stenosis. Aorta: The aortic root is normal in size and structure. Venous: The right lower pulmonary vein is normal. The inferior vena cava is normal in size with greater than 50% respiratory variability, suggesting right atrial pressure of 3 mmHg. IAS/Shunts: The atrial septum is grossly normal.  LEFT VENTRICLE PLAX 2D LVIDd:         4.70 cm  Diastology LVIDs:         2.70 cm  LV e' medial:    5.66 cm/s LV PW:         1.50 cm  LV E/e' medial:  16.0 LV IVS:        1.40 cm  LV e' lateral:   6.20 cm/s LVOT diam:     1.90 cm  LV E/e' lateral: 14.6 LV SV:         47 LV SV Index:   22 LVOT Area:     2.84 cm  RIGHT VENTRICLE RV S prime:     10.70 cm/s TAPSE (M-mode): 2.1 cm LEFT ATRIUM             Index       RIGHT ATRIUM           Index LA diam:        3.80 cm 1.77 cm/m   RA Area:     15.70 cm LA Vol (A2C):   72.4 ml 33.72 ml/m RA Volume:   40.80 ml  19.00 ml/m LA Vol (A4C):   26.7 ml 12.44 ml/m LA Biplane Vol: 44.6 ml 20.77 ml/m  AORTIC VALVE LVOT Vmax:   97.00 cm/s LVOT Vmean:  65.700 cm/s LVOT VTI:    0.166 m  AORTA Ao Root diam: 3.20 cm MITRAL VALVE               TRICUSPID VALVE MV Area (PHT): 2.94 cm    TR Peak grad:   10.0 mmHg MV Decel Time: 258 msec    TR Vmax:        158.00 cm/s MR Peak grad: 109.0 mmHg MR Mean grad: 57.0 mmHg    SHUNTS MR Vmax:      522.00 cm/s  Systemic VTI:  0.17 m MR Vmean:     344.0 cm/s   Systemic Diam: 1.90 cm MV E velocity: 90.60 cm/s MV A velocity: 49.50 cm/s MV E/A ratio:  1.83 Eleonore Chiquito MD Electronically signed by Eleonore Chiquito MD Signature Date/Time: 05/03/2021/4:42:36 PM    Final     Microbiology: Recent Results (from the past 240 hour(s))  SARS CORONAVIRUS 2 (TAT 6-24 HRS) Nasopharyngeal Nasopharyngeal Swab     Status: None   Collection Time: 05/02/21  8:15 AM   Specimen: Nasopharyngeal Swab  Result Value Ref Range Status   SARS Coronavirus 2 NEGATIVE NEGATIVE Final    Comment: (NOTE) SARS-CoV-2 target nucleic acids are NOT DETECTED.  The SARS-CoV-2 RNA is generally detectable in upper and lower respiratory specimens during the acute phase of infection. Negative results do not preclude SARS-CoV-2 infection, do not rule out co-infections with other pathogens, and should not be used as the sole basis for treatment or other patient management decisions. Negative results must be combined with clinical observations, patient history, and epidemiological information. The expected result is Negative.  Fact Sheet for Patients: SugarRoll.be  Fact Sheet for Healthcare Providers: https://www.woods-mathews.com/  This test is not yet approved or cleared by the [place] FDA and  has been authorized for detection and/or diagnosis of SARS-CoV-2 by FDA under an Emergency Use  Authorization (EUA). This EUA will remain  in effect (meaning this test can be used) for the duration of the COVID-19 declaration under Se ction 564(b)(1) of the Act, 21 U.S.C. section 360bbb-3(b)(1), unless the authorization is terminated or revoked sooner.  Performed at [facility], [place] [address] [place], [place] [address]      Labs: Basic Metabolic Panel: Recent Labs  Lab 05/02/21 0505 05/02/21 1052 05/03/21 0357  NA 137  --  136  K 4.3  --  4.0  CL 100  --  101  CO2 25  --  25  GLUCOSE 114*  --  99  BUN 9  --  10  CREATININE 1.02*  --  1.01*  CALCIUM 10.0  --  9.6  MG  --  2.0  --    Liver Function Tests: Recent Labs  Lab 05/02/21 0505  AST 28  ALT 22  ALKPHOS 44  BILITOT 0.8  PROT 7.8  ALBUMIN 4.1   No results for input(s): LIPASE, AMYLASE in the last 168 hours. No results for input(s): AMMONIA in the last 168 hours. CBC: Recent Labs  Lab 05/02/21 0453 05/03/21 0357  WBC 4.0 4.7  HGB 14.9 14.2  HCT 42.8 40.6  MCV 95.5 96.4  PLT 185 188   Cardiac Enzymes: No results for input(s): CKTOTAL, CKMB, CKMBINDEX, TROPONINI in the last 168 hours. BNP: BNP (last 3 results) Recent Labs    05/02/21 0505  BNP 47.8    ProBNP (last 3 results) No results for input(s): PROBNP in the last 8760 hours.  CBG: No results for input(s): GLUCAP in the last 168 hours.     Signed:  Alma Friendly, MD [facility] 05/03/2021, 4:52 PM

## 2021-05-03 NOTE — Progress Notes (Signed)
*  PRELIMINARY RESULTS* Echocardiogram 2D Echocardiogram has been performed.  Stacey Drain 05/03/2021, 3:52 PM

## 2021-05-07 DIAGNOSIS — I10 Essential (primary) hypertension: Secondary | ICD-10-CM | POA: Diagnosis not present

## 2021-05-07 DIAGNOSIS — I4892 Unspecified atrial flutter: Secondary | ICD-10-CM | POA: Diagnosis not present

## 2021-07-31 DIAGNOSIS — F332 Major depressive disorder, recurrent severe without psychotic features: Secondary | ICD-10-CM | POA: Diagnosis not present

## 2021-07-31 DIAGNOSIS — F411 Generalized anxiety disorder: Secondary | ICD-10-CM | POA: Diagnosis not present

## 2021-08-27 DIAGNOSIS — F314 Bipolar disorder, current episode depressed, severe, without psychotic features: Secondary | ICD-10-CM | POA: Diagnosis not present

## 2021-09-01 DIAGNOSIS — F411 Generalized anxiety disorder: Secondary | ICD-10-CM | POA: Diagnosis not present

## 2021-09-01 DIAGNOSIS — F5101 Primary insomnia: Secondary | ICD-10-CM | POA: Diagnosis not present

## 2021-09-01 DIAGNOSIS — F331 Major depressive disorder, recurrent, moderate: Secondary | ICD-10-CM | POA: Diagnosis not present

## 2021-09-01 DIAGNOSIS — I10 Essential (primary) hypertension: Secondary | ICD-10-CM | POA: Diagnosis not present

## 2021-09-08 DIAGNOSIS — F314 Bipolar disorder, current episode depressed, severe, without psychotic features: Secondary | ICD-10-CM | POA: Diagnosis not present

## 2021-10-06 IMAGING — CT CT HEAD W/O CM
4 series · 16 of 47 positions shown, 18 images · non-contrast
Comparison: None.

CLINICAL DATA: Headache that awoke the patient from sleep

EXAM:
CT HEAD WITHOUT CONTRAST
TECHNIQUE: Contiguous axial images were obtained from the base of the skull
through the vertex without intravenous contrast.

[Series 3: head without · axial · non-contrast · 0.45mm/px · z∈[-45,+80]mm · 7 of 35 slices shown, 9 images]
[im 5/35  brain]
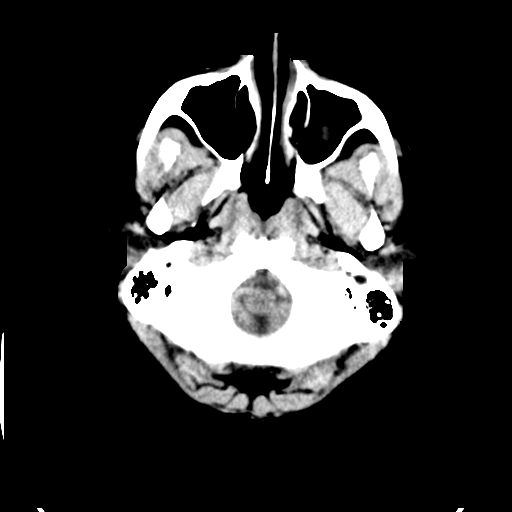
[im 5/35  bone]
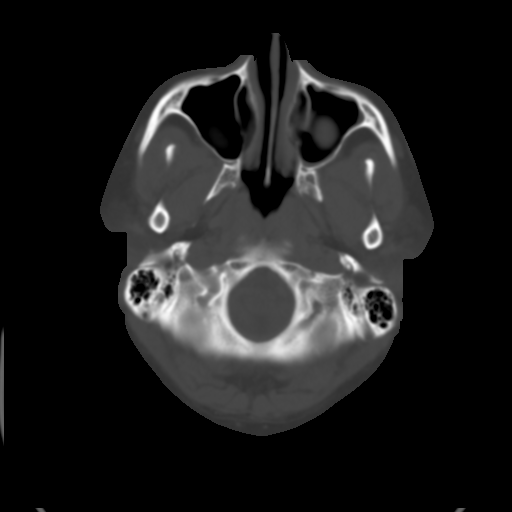
[im 9/35  brain]
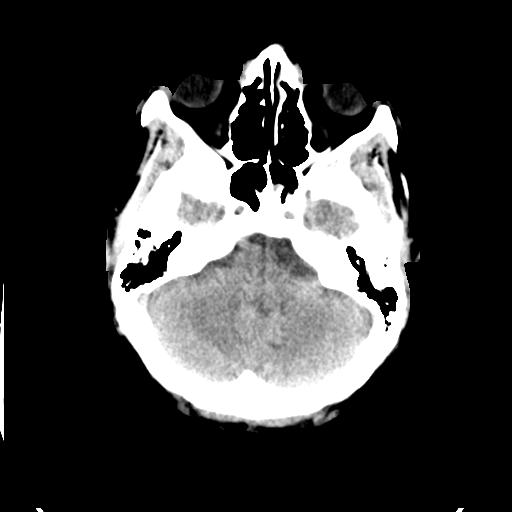
[im 13/35  brain]
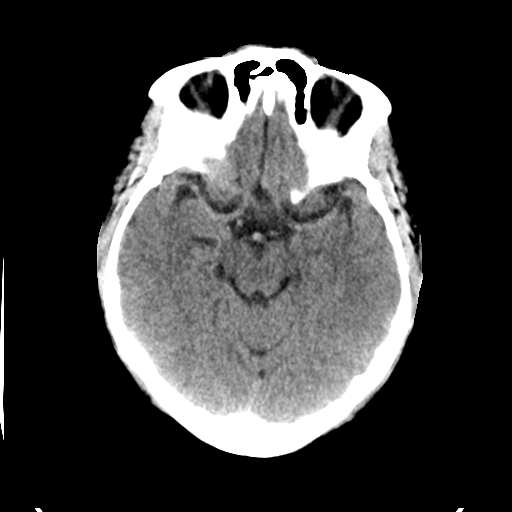
[im 18/35  brain]
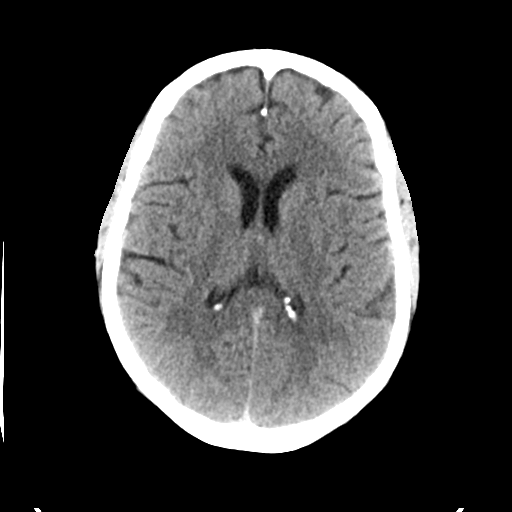
[im 22/35  brain]
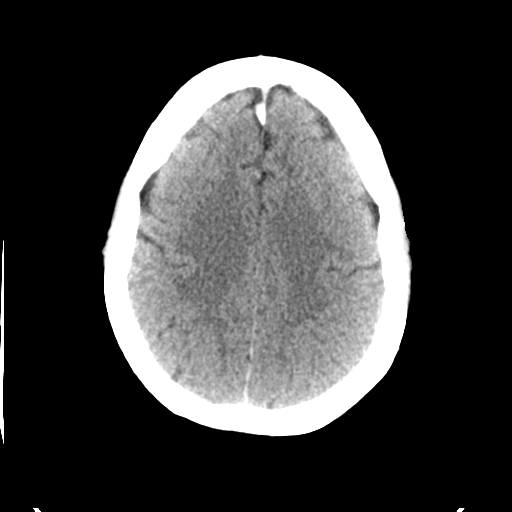
[im 22/35  bone]
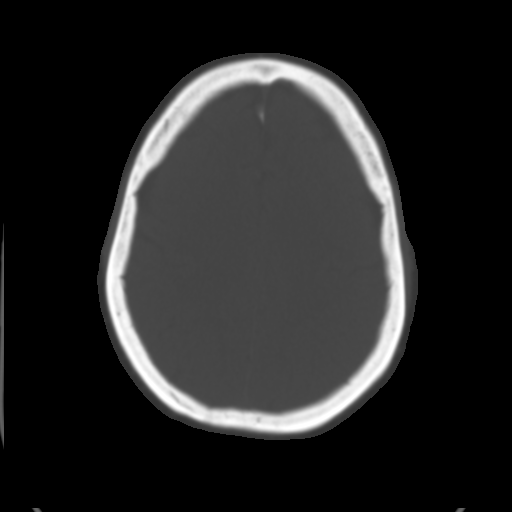
[im 26/35  brain]
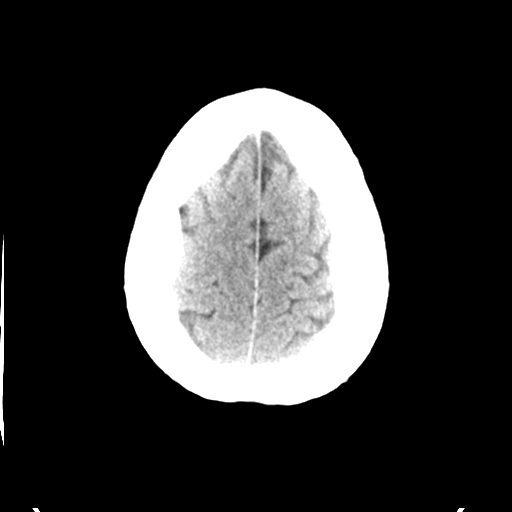
[im 30/35  brain]
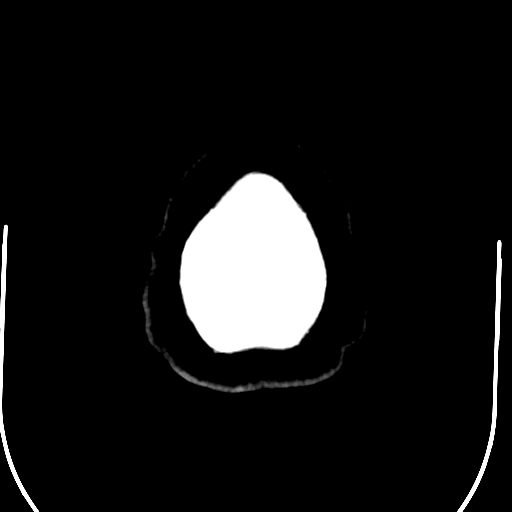

[Series 4: head bone · axial · 0.45mm/px · z∈[-49,-15]mm · 3 of 87 slices shown]
[im 9/87  bone]
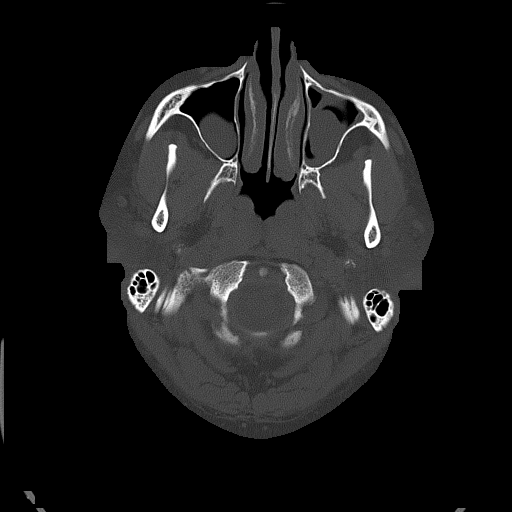
[im 18/87  bone]
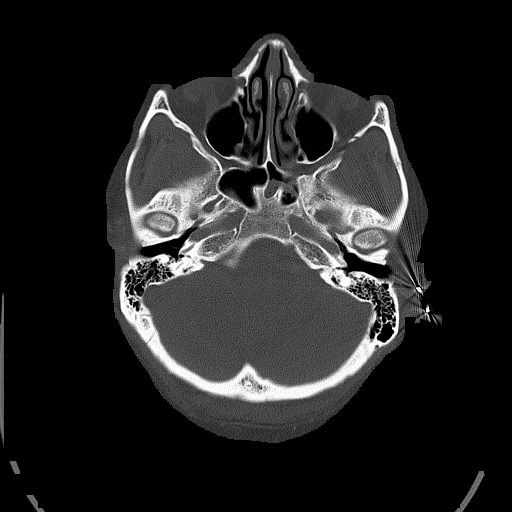
[im 26/87  bone]
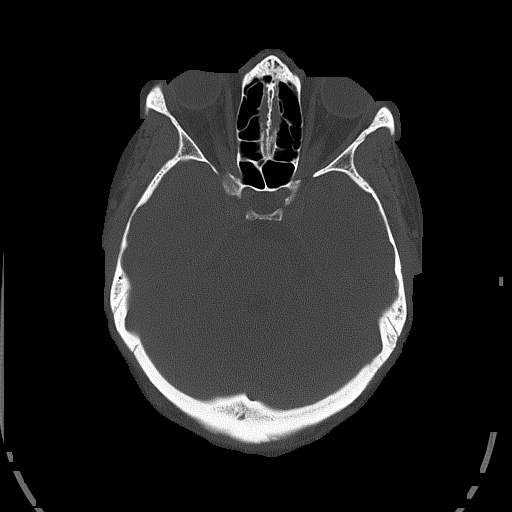

[Series 5: head without cor · coronal · non-contrast · 0.34mm/px · 3 of 67 slices shown]
[im 23/67  brain]
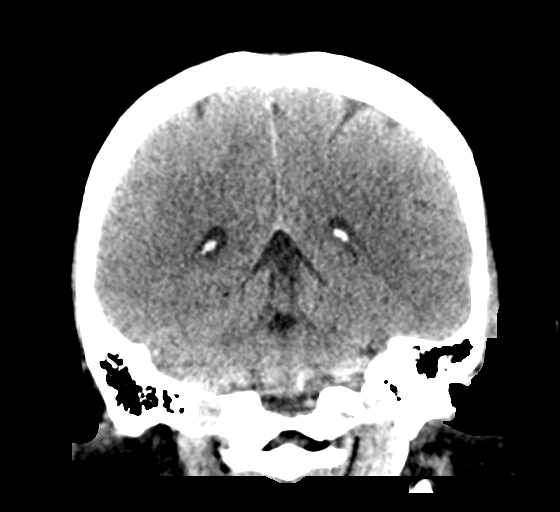
[im 30/67  brain]
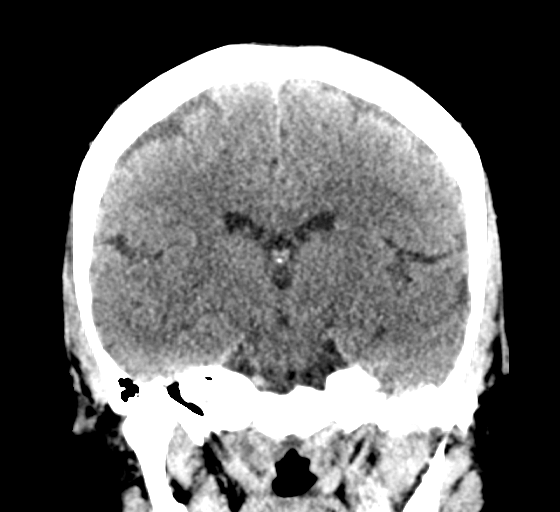
[im 37/67  brain]
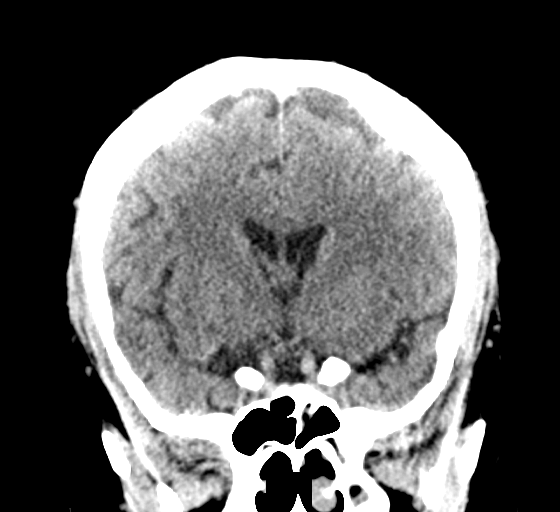

[Series 6: head without sag · sagittal · non-contrast · 0.34mm/px · 3 of 67 slices shown]
[im 23/67  brain]
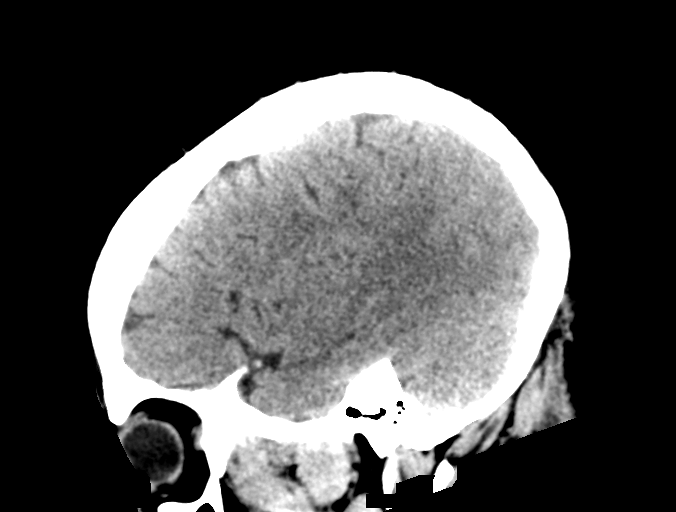
[im 34/67  brain]
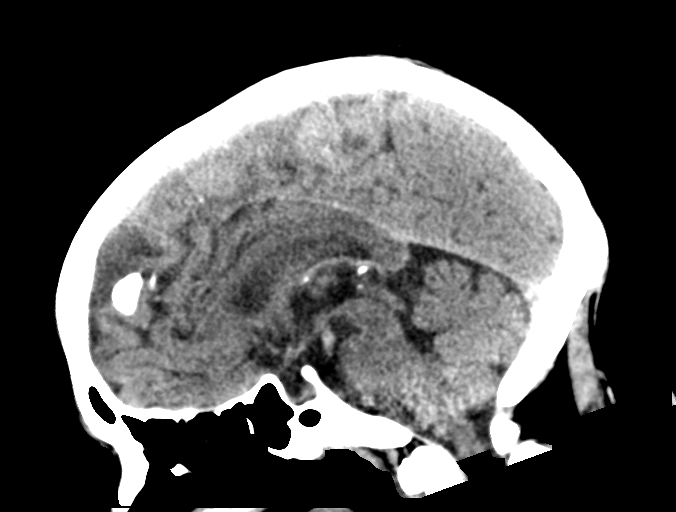
[im 45/67  brain]
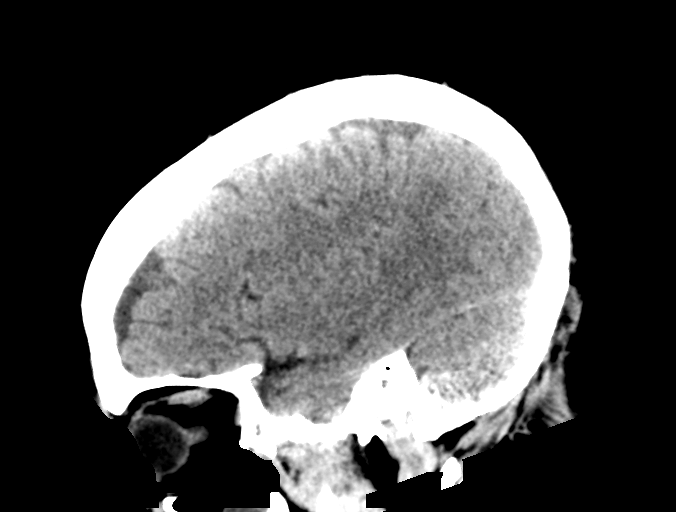

[16 of 47 positions shown; findings below may reference images not displayed]

FINDINGS: Brain: No evidence of acute infarction, hemorrhage, hydrocephalus,
extra-axial collection or mass lesion/mass effect.

Vascular: No hyperdense vessel or unexpected calcification.

Skull: Normal. Negative for fracture or focal lesion.

Sinuses/Orbits: Mucosal thickening in the inferior maxillary sinuses
with polypoid structures usually from retention cysts. Secretions in
the left sphenoid sinus without mucosal thickening.
IMPRESSION: 1. Negative intracranial imaging.
2. Bilateral sinus opacification.

## 2021-10-06 IMAGING — DX DG CHEST 1V PORT
1 series · 1 of 1 positions shown · non-contrast
Comparison: 09/30/2020

CLINICAL DATA: Chest pain

EXAM:
PORTABLE CHEST 1 VIEW

[chest]
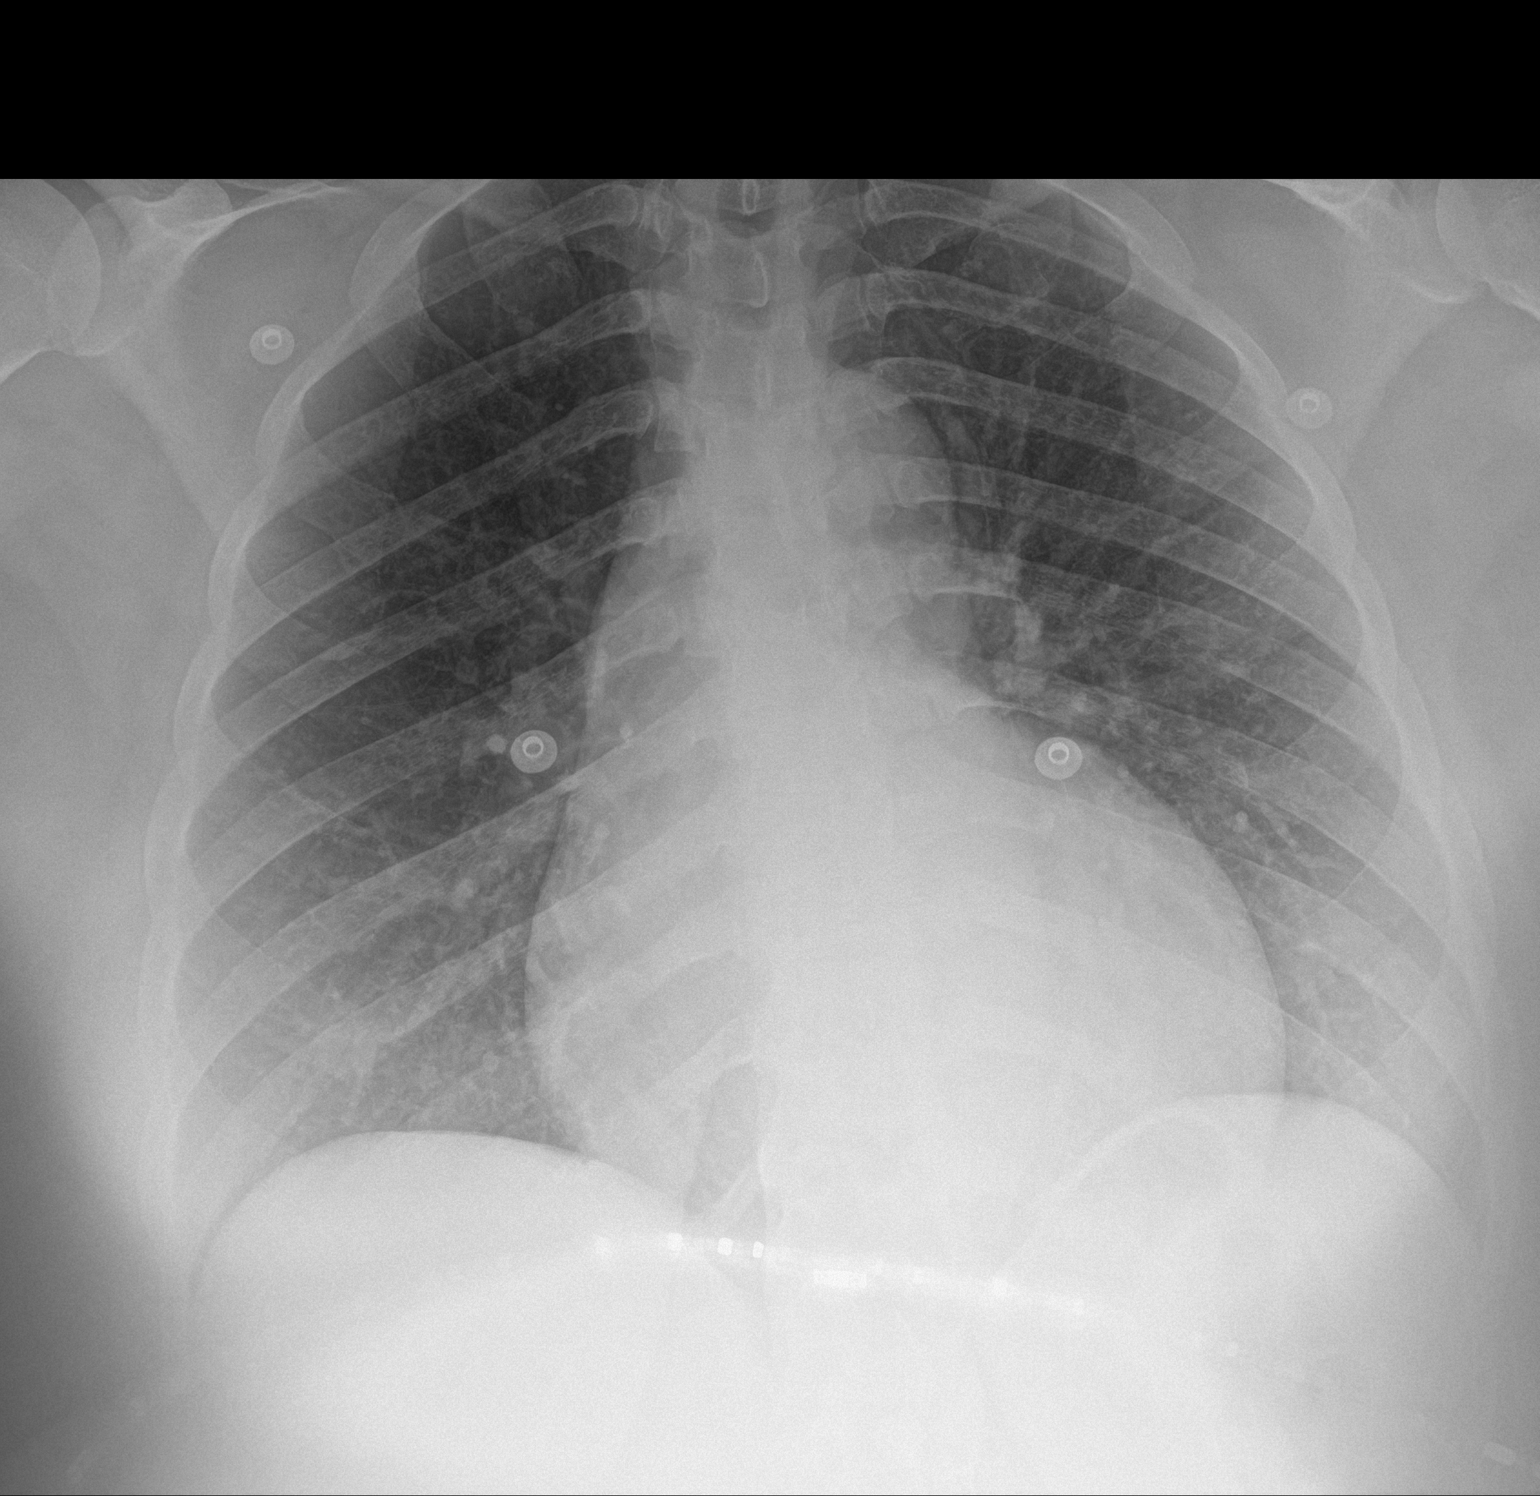

[1 of 1 positions shown; findings below may reference images not displayed]

FINDINGS: Stable generous left ventricular contour. Unremarkable aortic and
hilar contours. There is no edema, consolidation, effusion, or
pneumothorax. No acute osseous finding.
IMPRESSION: Stable exam.  No evidence of acute disease.

## 2021-12-11 DIAGNOSIS — I10 Essential (primary) hypertension: Secondary | ICD-10-CM | POA: Diagnosis not present

## 2021-12-11 DIAGNOSIS — I43 Cardiomyopathy in diseases classified elsewhere: Secondary | ICD-10-CM | POA: Diagnosis not present

## 2021-12-11 DIAGNOSIS — I4892 Unspecified atrial flutter: Secondary | ICD-10-CM | POA: Diagnosis not present

## 2022-01-01 DIAGNOSIS — F332 Major depressive disorder, recurrent severe without psychotic features: Secondary | ICD-10-CM | POA: Diagnosis not present

## 2022-01-01 DIAGNOSIS — R102 Pelvic and perineal pain: Secondary | ICD-10-CM | POA: Diagnosis not present

## 2022-01-01 DIAGNOSIS — N898 Other specified noninflammatory disorders of vagina: Secondary | ICD-10-CM | POA: Diagnosis not present

## 2022-01-01 DIAGNOSIS — I43 Cardiomyopathy in diseases classified elsewhere: Secondary | ICD-10-CM | POA: Diagnosis not present

## 2022-01-01 DIAGNOSIS — N760 Acute vaginitis: Secondary | ICD-10-CM | POA: Diagnosis not present

## 2022-01-01 DIAGNOSIS — I10 Essential (primary) hypertension: Secondary | ICD-10-CM | POA: Diagnosis not present

## 2022-03-29 ENCOUNTER — Ambulatory Visit (INDEPENDENT_AMBULATORY_CARE_PROVIDER_SITE_OTHER): Payer: BC Managed Care – PPO | Admitting: Behavioral Health

## 2022-03-29 DIAGNOSIS — F419 Anxiety disorder, unspecified: Secondary | ICD-10-CM | POA: Diagnosis not present

## 2022-03-29 DIAGNOSIS — F32A Depression, unspecified: Secondary | ICD-10-CM | POA: Diagnosis not present

## 2022-03-29 NOTE — Progress Notes (Deleted)
Tracy Grant is a 36 y.o. female patient ***.     Collaboration of Care: {BH OP Collaboration of Care:[id number]}  Patient/Guardian was advised Release of Information must be obtained prior to any record release in order to collaborate their care with an outside provider. Patient/Guardian was advised if they have not already done so to contact the registration department to sign all necessary forms in order for [place] to release information regarding their care.   Consent: Patient/Guardian gives verbal consent for treatment and assignment of benefits for services provided during this visit. Patient/Guardian expressed understanding and agreed to proceed.    Deneise Lever, LMFT

## 2022-03-29 NOTE — Progress Notes (Signed)
                Tracy Grant Tracy Hadeel Hillebrand, LMFT 

## 2022-03-29 NOTE — Progress Notes (Signed)
[facility] Counselor Initial Adult Exam  Name: Tracy Grant Date: 03/29/2022 MRN: [id number] DOB: May 30, 1986 PCP: Scheryl Marten, PA  Time spent: 60 min  Guardian/Payee:  Self    Paperwork requested:  No, Pt is trying to work w/her job to secure a LOA as she requested in late 2022 for 45d to care for herself & her mental health  Reason for Visit /Presenting Problem: Pt is waking up sad & wants to know, "what is wrong w/me". She has a Hx of Bipolar dep. Pt recognizes she is starting to have a hard time again & wants to care for herself before things get worse.  Mental Status Exam: Appearance:   Neat     Behavior:  Appropriate  Motor:  Normal  Speech/Language:   Normal Rate  Affect:  Congruent  Mood:  normal  Thought process:  normal  Thought content:    WNL  Sensory/Perceptual disturbances:    WNL  Orientation:  oriented to person, place, and time/date  Attention:  Good  Concentration:  Good  Memory:  WNL  Fund of knowledge:   Good  Insight:    Good  Judgment:   Good  Impulse Control:  Good    Reported Symptoms:  Pt is waking sad & has occasional thoughts of, "What am I doing here". Pt has 34yo Fr [place] living in her home she purchased in 2020. Pt also has 8yo Son CJ living in the home. Pt is concerned for her dep to worsen.  Risk Assessment: Danger to Self:  No; Pt considered taking pills to commit suicide when her Son was 53 mos old, but she decided this due to his dependency on her.  Self-injurious Behavior: No Danger to Others: No Duty to Warn:no Physical Aggression / Violence:No  Access to Firearms a concern: No  Gang Involvement:No  Patient / guardian was educated about steps to take if suicide or homicide risk level increases between visits: yes While future psychiatric events cannot be accurately predicted, the patient does not currently require acute inpatient psychiatric care and does not currently meet [facility]  involuntary commitment criteria.  Substance Abuse History: Current substance abuse: No   Pt sts she drinks ETOH, but this is social in nature.  Past Psychiatric History:   Previous psychological history is significant for Hx of Bipolar dep. Pt was taken to a Psychiatrist in 4th Gr, but she does not remember  why.   In 2015 Pt was preg & homeless. She stayed @ [facility] & was required to attend a one session Intake/Assessment @ the [facility]. She was 36yo & a Provider Dx her w/"Bipolar mania" @ that time. Pt does not remember details of this visit.  Outpatient Providers:Virginia Sabra Heck, PA-C w/[facility] is her PCP. She last visited for check-up ~ 2 mos ago. Pt was not exp'g today's Sx @ that time.   Pt takes Prozac $RemoveBefo'20mg'HZDhpAHTfwq$  & uses Trazadone $RemoveBefore'50mg'gBBUAgSRIgrSO$ , prn.  History of Psych Hospitalization: No  Psychological Testing:  not noted today    Abuse History:  Victim of: No.,  NA    Report needed: No. Victim of Neglect:No. Perpetrator of  NA   Witness / Exposure to Domestic Violence: No   Protective Services Involvement: No  Witness to [employer] Violence:  No   Family History:  Family History  Problem Relation Age of Onset   Hypertension Mother    Heart attack Mother  received stent in her early 34s   Hypertension Father    Hypertension Brother    Hypertension Maternal Grandmother    Hypertension Maternal Grandfather    Hypertension Paternal Grandmother    Hypertension Paternal Grandfather     Living situation: the patient lives with their family  Sexual Orientation: Straight  Relationship Status: single  Name of spouse / other: Son's Fr is named Medical laboratory scientific officer & lives in [place] w/his Wife & Stepchildren If a parent, number of children / ages: 36yo [place]: parents Bros Nilda Calamity [phone])  Financial Stress:  Yes   Income/Employment/Disability: 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 She works 8-5pm daily & goes into [employer]. Pt has been  out of work since last [employer] is considering her options for a second LOA to care for herself. Pt attends [employer] nightly from 5:30-9:30pm. She is close to reaching her 035 hr mark for Certification.   Military Service: No   Educational History: Education: some college; Pt attended [employer] in [place] a second time & then classes @ [employer]. She is currently engaged in [employer] provide services in this area.  Religion/Sprituality/World View: Not noted today  Any cultural differences that may affect / interfere with treatment:  not noted today; Pt did mention her Co-Pay  Recreation/Hobbies: Pt has enjoyed going to the Gym in the past   Stressors: Educational concerns   Financial difficulties   Health problems   Loss of secure sense of mental health well-being    Strengths: Supportive Relationships, Family, Friends, Able to Communicate Effectively, and Pt is very resilient & has weathered tremendous crisis in her lifetime; homelessness, Single Parenting & criticism from extended Family relatives  Barriers:  Pt hopes to be "consistent" w/psychotherapy & c/o lack of consistency in her life  Legal History: Pending legal issue / charges: The patient has no significant history of legal issues. History of legal issue / charges:  not noted today  Medical History/Surgical History: reviewed Past Medical History:  Diagnosis Date   Allergy    Bipolar disorder ([place]) 2014   Cardiomyopathy ([place])    a. Echo (10/15):  EF 35-40%, mild MR, mild to moderately reduced RV systolic function, PASP 31 mmHg  ; b.  Echo (12/15):  Posterior basal HK, mild LVH, EF 55%, mild LAE.   Headache(784.0)    History of chicken pox    Hx of migraines    Hyperlipidemia    Hypertension    Infection    trich    Past Surgical History:  Procedure Laterality Date   ADENOIDECTOMY     CESAREAN SECTION N/A 01/17/2014   Procedure: CESAREAN SECTION;  Surgeon: Shelly Bombard, MD;  Location: [facility];  Service: Obstetrics;  Laterality: N/A;   DILATION AND CURETTAGE OF UTERUS      Medications: Current Outpatient Medications  Medication Sig Dispense Refill   acetaminophen (TYLENOL) 500 MG tablet Take 1,000 mg by mouth every 6 (six) hours as needed for moderate pain or headache.     amLODipine (NORVASC) 10 MG tablet Take 1 tablet (10 mg total) by mouth daily. 30 tablet 0   Blood Pressure KIT Check BP daily 1 kit 0   FLUoxetine (PROZAC) 20 MG capsule Take 20 mg by mouth daily.     guaiFENesin (MUCINEX PO) Take 20 mLs by mouth 2 (two) times daily as needed (congestion).  levonorgestrel (MIRENA) 20 MCG/24HR IUD 1 each by Intrauterine route once.     metoprolol succinate (TOPROL-XL) 25 MG 24 hr tablet Take 1 tablet (25 mg total) by mouth daily. 30 tablet 0   traZODone (DESYREL) 50 MG tablet Take 50 mg by mouth at bedtime as needed for sleep.     No current facility-administered medications for this visit.    Allergies  Allergen Reactions   Lisinopril Swelling    Diagnoses:  Depression and Anxiety  Plan of Care: Secure a f/u appt w/PCP, Fredda Hammed, PA-C @ [facility] to review meds & report about bloody nose.  Review Sleep Hygiene pdf & try to initiate some of the 15 tips Begin to Journal like we discussed & monitor mood Get clarification about your benefits @ [employer] Get clarification about your Sch work & expectations Take your friend that works @ [employer] up on her invitation   Donnetta Hutching, LMFT

## 2022-04-02 DIAGNOSIS — F332 Major depressive disorder, recurrent severe without psychotic features: Secondary | ICD-10-CM | POA: Diagnosis not present

## 2022-04-02 DIAGNOSIS — I10 Essential (primary) hypertension: Secondary | ICD-10-CM | POA: Diagnosis not present

## 2022-04-02 DIAGNOSIS — F411 Generalized anxiety disorder: Secondary | ICD-10-CM | POA: Diagnosis not present

## 2022-04-12 ENCOUNTER — Ambulatory Visit: Payer: BC Managed Care – PPO | Admitting: Internal Medicine

## 2022-04-12 ENCOUNTER — Emergency Department (HOSPITAL_BASED_OUTPATIENT_CLINIC_OR_DEPARTMENT_OTHER)
Admission: EM | Admit: 2022-04-12 | Discharge: 2022-04-12 | Disposition: A | Discharge status: Home / Self Care | Payer: BC Managed Care – PPO | Attending: Emergency Medicine | Admitting: Emergency Medicine

## 2022-04-12 ENCOUNTER — Ambulatory Visit: Payer: BC Managed Care – PPO | Admitting: Behavioral Health

## 2022-04-12 ENCOUNTER — Encounter (HOSPITAL_BASED_OUTPATIENT_CLINIC_OR_DEPARTMENT_OTHER): Payer: Self-pay | Admitting: Emergency Medicine

## 2022-04-12 ENCOUNTER — Emergency Department (HOSPITAL_BASED_OUTPATIENT_CLINIC_OR_DEPARTMENT_OTHER): Payer: BC Managed Care – PPO | Admitting: Radiology

## 2022-04-12 ENCOUNTER — Other Ambulatory Visit: Payer: Self-pay

## 2022-04-12 DIAGNOSIS — R0602 Shortness of breath: Secondary | ICD-10-CM | POA: Insufficient documentation

## 2022-04-12 DIAGNOSIS — R9431 Abnormal electrocardiogram [ECG] [EKG]: Secondary | ICD-10-CM | POA: Insufficient documentation

## 2022-04-12 DIAGNOSIS — I10 Essential (primary) hypertension: Secondary | ICD-10-CM | POA: Diagnosis not present

## 2022-04-12 DIAGNOSIS — Z79899 Other long term (current) drug therapy: Secondary | ICD-10-CM | POA: Diagnosis not present

## 2022-04-12 DIAGNOSIS — I160 Hypertensive urgency: Secondary | ICD-10-CM | POA: Diagnosis not present

## 2022-04-12 NOTE — ED Triage Notes (Signed)
Pt sent from PCP related to an abnormal EKG. Pt denis pain other than a slight  headache. Pt alert and oriented.

## 2022-04-12 NOTE — Discharge Instructions (Signed)
Your EKG does have changes, but these are not new, and were present on you EKG 1 year ago. I am reassured you are not having concerning symptoms with this. Follow up with you cardiologist next week as planned. Return if you develop chest pain or shortness of breath or other new or concerning symptoms.

## 2022-04-12 NOTE — ED Provider Notes (Signed)
[facility] EMERGENCY DEPT Provider Note   CSN: [id number] Arrival date & time: 04/12/22  1040     History  Chief Complaint  Patient presents with   Abnormal ECG    Tracy Grant is a 36 y.o. female.  Tracy Grant is a 36 y.o. female with history of hypertension, cardiomyopathy, hyperlipidemia, migraines, who presents to the ED from primary care office for evaluation of abnormal EKG.  Patient reports that she had an appointment with her doctor today for routine follow-up for blood pressure management.  Reports that her doctor has been making adjustments to her blood pressure medication to try and get her blood pressure under better control.  She reports that she made some changes to her medications today but also checked an EKG.  Patient reports that she was told that this EKG was abnormal and sent to the ED for further evaluation.  She denies any chest pain.  Reports she has had some mild baseline shortness of breath ever since her COVID infection a few years ago but denies any change or worsening in the shortness of breath recently.  No lower extremity swelling or pain.  No headache, visual changes, numbness or weakness.  Patient reports that she has a follow-up appointment with her cardiologist next week.  The history is provided by the patient.       Home Medications Prior to Admission medications   Medication Sig Start Date End Date Taking? Authorizing Provider  acetaminophen (TYLENOL) 500 MG tablet Take 1,000 mg by mouth every 6 (six) hours as needed for moderate pain or headache.    [provider]  amLODipine (NORVASC) 10 MG tablet Take 1 tablet (10 mg total) by mouth daily. 05/04/21 06/03/21  Alma Friendly, MD  Blood Pressure KIT Check BP daily 05/03/21   Alma Friendly, MD  FLUoxetine (PROZAC) 20 MG capsule Take 20 mg by mouth daily.    [provider]  guaiFENesin (MUCINEX PO) Take 20 mLs by mouth 2 (two) times daily as  needed (congestion).    [provider]  levonorgestrel (MIRENA) 20 MCG/24HR IUD 1 each by Intrauterine route once.    [provider]  metoprolol succinate (TOPROL-XL) 25 MG 24 hr tablet Take 1 tablet (25 mg total) by mouth daily. 05/04/21 06/03/21  Alma Friendly, MD  traZODone (DESYREL) 50 MG tablet Take 50 mg by mouth at bedtime as needed for sleep. 12/08/19   [provider]      Allergies    Lisinopril    Review of Systems   Review of Systems  Constitutional:  Negative for chills and fever.  Eyes:  Negative for visual disturbance.  Respiratory:  Negative for cough and shortness of breath.   Cardiovascular:  Negative for chest pain and leg swelling.  Gastrointestinal:  Negative for abdominal pain.  Neurological:  Negative for weakness, numbness and headaches.    Physical Exam Updated Vital Signs BP (!) 168/123 (BP Location: Right Arm)   Pulse 85   Temp 98.3 F (36.8 C) (Oral)   Resp 16   Ht _0  (1.651 m)   Wt 114.8 kg   SpO2 100%   BMI 42.10 kg/m  Physical Exam Vitals and nursing note reviewed.  Constitutional:      General: She is not in acute distress.    Appearance: Normal appearance. She is well-developed. She is not diaphoretic.  HENT:     Head: Normocephalic and atraumatic.  Eyes:     General:  Right eye: No discharge.        Left eye: No discharge.     Pupils: Pupils are equal, round, and reactive to light.  Cardiovascular:     Rate and Rhythm: Normal rate and regular rhythm.     Pulses: Normal pulses.     Heart sounds: Normal heart sounds.  Pulmonary:     Effort: Pulmonary effort is normal. No respiratory distress.     Breath sounds: Normal breath sounds. No wheezing or rales.     Comments: Respirations equal and unlabored, patient able to speak in full sentences, lungs clear to auscultation bilaterally  Abdominal:     General: Bowel sounds are normal. There is no distension.     Palpations: Abdomen is soft.  There is no mass.     Tenderness: There is no abdominal tenderness. There is no guarding.     Comments: Abdomen soft, nondistended, nontender to palpation in all quadrants without guarding or peritoneal signs  Musculoskeletal:        General: No deformity.     Cervical back: Neck supple.  Skin:    General: Skin is warm and dry.     Capillary Refill: Capillary refill takes less than 2 seconds.  Neurological:     Mental Status: She is alert and oriented to person, place, and time.     Coordination: Coordination normal.     Comments: Speech is clear, able to follow commands CN III-XII intact Normal strength in upper and lower extremities bilaterally including dorsiflexion and plantar flexion, strong and equal grip strength Sensation normal to light and sharp touch Moves extremities without ataxia, coordination intact  Psychiatric:        Mood and Affect: Mood normal.        Behavior: Behavior normal.     ED Results / Procedures / Treatments   Labs (all labs ordered are listed, but only abnormal results are displayed) Labs Reviewed - No data to display  EKG EKG Interpretation  Date/Time:  Monday April 12 2022 10:56:36 EDT Ventricular Rate:  81 PR Interval:  114 QRS Duration: 132 QT Interval:  414 QTC Calculation: 480 R Axis:   36 Text Interpretation: Normal sinus rhythm Non-specific intra-ventricular conduction block Inferior infarct , age undetermined T wave abnormality, consider lateral ischemia Abnormal ECG When compared with ECG of 02-May-2021 04:44, Inferior infarct is now Present No significant change since last tracing Confirmed by Lacretia Leigh ([id number]) on 04/12/2022 11:00:10 AM  Radiology DG Chest 2 View  Result Date: 04/12/2022 CLINICAL DATA:  ST segment depression.  Abnormal EKG.  Hypertension. EXAM: CHEST - 2 VIEW COMPARISON:  05/02/2021 FINDINGS: The heart size and mediastinal contours are within normal limits. Both lungs are clear. The visualized skeletal  structures are unremarkable. IMPRESSION: No active cardiopulmonary disease. Electronically Signed   By: Kerby Moors M.D.   On: 04/12/2022 11:38    Procedures Procedures    Medications Ordered in ED Medications - No data to display  ED Course/ Medical Decision Making/ A&P                           Medical Decision Making Amount and/or Complexity of Data Reviewed Radiology: ordered.   36 y.o. female presents to the ED with complaints of abnormal EKG, no other complaints, this involves an extensive number of treatment options, and is a complaint that carries with it a high risk of complications and morbidity.   Patient had a routine  EKG done during her blood pressure follow-up appointment with PCP.  She was sent in because this EKG was abnormal.  While patient does have some changes to her EKG these are chronic and unchanged when compared to EKG from September 2022.  She has some nonspecific T wave changes and an intraventricular conduction block.   Despite EKG changes which are not new from prior patient is experiencing no associated symptoms.  She has been followed for high blood pressure and had her medications adjusted today but is not experiencing any chest pain, shortness of breath, lower extremity swelling, headache, visual changes, numbness or weakness  On arrival pt is nontoxic, vitals hypertension with systolic blood pressure 384, otherwise normal.   Additional history obtained from chart review. Previous records obtained and reviewed prior EKGs and cardiology notes  ED Course:   Given that these EKG changes are not new and patient has no associated symptoms like consider lab work or imaging I do not think they would change management at this time.  Patient already has scheduled cardiology follow-up next week and I do not think she needs any further emergent work-up at this time.  I discussed these recommendations with patient and she is in agreement and continues to deny any  associated symptoms.  Has been continuing to take her blood pressure medication as directed and is instructed to follow medication regimen changes as prescribed by her doctor.  At this time there does not appear to be any evidence of an acute emergency medical condition requiring further emergent evaluation and the patient appears stable for discharge with appropriate outpatient follow up. Diagnosis and return precautions discussed with patient who verbalizes understanding and is agreeable to discharge.      Portions of this note were generated with [employer]. Dictation errors may occur despite best attempts at proofreading.         Final Clinical Impression(s) / ED Diagnoses Final diagnoses:  Abnormal EKG    Rx / DC Orders ED Discharge Orders     None         Janet Berlin 04/18/22 0736    Lacretia Leigh, MD 04/18/22 [phone]

## 2022-04-12 NOTE — Progress Notes (Unsigned)
                Tracy Grant L Tracy Taplin, LMFT 

## 2022-04-15 DIAGNOSIS — Z0000 Encounter for general adult medical examination without abnormal findings: Secondary | ICD-10-CM | POA: Diagnosis not present

## 2022-04-15 DIAGNOSIS — E785 Hyperlipidemia, unspecified: Secondary | ICD-10-CM | POA: Diagnosis not present

## 2022-04-15 DIAGNOSIS — Z13220 Encounter for screening for lipoid disorders: Secondary | ICD-10-CM | POA: Diagnosis not present

## 2022-04-20 NOTE — Progress Notes (Unsigned)
Cardiology Clinic Note   Patient Name: Tracy Grant Date of Encounter: 04/21/2022  Primary Care Provider:  Scheryl Marten, PA Primary Cardiologist:  None  Patient Profile    Tracy Grant 36 year old female presents to the clinic today for follow-up evaluation of her atrial flutter and dilated cardiomyopathy.  Past Medical History    Past Medical History:  Diagnosis Date   Allergy    Bipolar disorder ([place]) 2014   Cardiomyopathy ([place])    a. Echo (10/15):  EF 35-40%, mild MR, mild to moderately reduced RV systolic function, PASP 31 mmHg  ; b.  Echo (12/15):  Posterior basal HK, mild LVH, EF 55%, mild LAE.   Headache(784.0)    History of chicken pox    Hx of migraines    Hyperlipidemia    Hypertension    Infection    trich   Past Surgical History:  Procedure Laterality Date   ADENOIDECTOMY     CESAREAN SECTION N/A 01/17/2014   Procedure: CESAREAN SECTION;  Surgeon: Shelly Bombard, MD;  Location: [facility];  Service: Obstetrics;  Laterality: N/A;   DILATION AND CURETTAGE OF UTERUS      Allergies  Allergies  Allergen Reactions   Lisinopril Swelling    History of Present Illness    Tracy Grant has a PMH of migraine, atrial flutter, dilated cardiomyopathy, HTN, LVH, atrial flutter, morbid obesity, palpitations, bipolar disorder, anxiety and depression.  She presented to the [facility] on 04/12/2022.  She was noted to have an abnormal EKG.  She indicated that she had been to her doctor for routine follow-up and blood pressure management.  Her doctor has been adjusting her blood pressure medication for better blood pressure control.  An EKG was checked and she was noted to have abnormal EKG.  She was sent to the emergency department for further evaluation.  She denied chest pain.  She reported that she had some mild shortness of breath however it was at baseline.  She indicated that she had slight shortness of breath since having COVID infection  a few years prior.  She denied changes or worsening shortness of breath.  She denied lower extremity swelling, weakness, headache, visual changes, and numbness.  She indicated that she had a follow-up appointment with cardiology the following week.  Her blood pressure was 168/123.  Her pulse was 85.  Her EKG showed normal sinus rhythm with nonspecific intraventricular conduction block, inferior infarct undetermined age, and T wave abnormality.  There were no significant changes since her EKG on 04/12/22 at 11 AM.  Her EKG was compared to her EKG from September 2022.  She was noted to have some T wave changes and and intraventricular conduction delay.  Due to unchanged EKG and plan follow-up with cardiology it was not felt that she needed further work-up at that time.  She presents to the clinic today for follow-up evaluation states she has noticed brief episodes of fast heart rate that are occasional as well as brief episodes of chest pressure that last for seconds and dissipate on their own.  We reviewed her most recent visit to the [facility] and her blood pressure.  She reports that she took her medications this morning around an hour before her clinic visit.  Initially her blood pressure is 182/120 on recheck it is 148/98.  We reviewed the importance of avoiding high salt diet, increasing physical activity, avoiding sports drinks, caffeine, and secondary causes of hypertension.  She  reports that she has had high blood pressure since she was preeclamptic after having her son.  At that time she had IUD implantation and noted normal blood pressure since that time.  I asked her to follow-up with her OB/GYN about other options for birth control with consideration for her high blood pressure.  She has an appointment with her OB tomorrow and expressed understanding.  I have asked her to stop drinking caffeine and sports drinks.  I will increase her HCTZ to 50 mg order a BMP in 1 week.  She also reports  daytime somnolence and loud snoring.  Her STOP-BANG score is 5.  I will order a split-night sleep study and have her follow-up in 3 to 4 months.  Today she denies chest pain, shortness of breath, lower extremity edema, fatigue, palpitations, melena, hematuria, hemoptysis, diaphoresis, weakness, presyncope, syncope, and orthopnea.    Home Medications    Prior to Admission medications   Medication Sig Start Date End Date Taking? Authorizing Provider  acetaminophen (TYLENOL) 500 MG tablet Take 1,000 mg by mouth every 6 (six) hours as needed for moderate pain or headache.    [provider]  amLODipine (NORVASC) 10 MG tablet Take 1 tablet (10 mg total) by mouth daily. 05/04/21 06/03/21  Alma Friendly, MD  Blood Pressure KIT Check BP daily 05/03/21   Alma Friendly, MD  FLUoxetine (PROZAC) 20 MG capsule Take 20 mg by mouth daily.    [provider]  guaiFENesin (MUCINEX PO) Take 20 mLs by mouth 2 (two) times daily as needed (congestion).    [provider]  levonorgestrel (MIRENA) 20 MCG/24HR IUD 1 each by Intrauterine route once.    [provider]  metoprolol succinate (TOPROL-XL) 25 MG 24 hr tablet Take 1 tablet (25 mg total) by mouth daily. 05/04/21 06/03/21  Alma Friendly, MD  traZODone (DESYREL) 50 MG tablet Take 50 mg by mouth at bedtime as needed for sleep. 12/08/19   [provider]    Family History    Family History  Problem Relation Age of Onset   Hypertension Mother    Heart attack Mother        received stent in her early 40s   Hypertension Father    Hypertension Brother    Hypertension Maternal Grandmother    Hypertension Maternal Grandfather    Hypertension Paternal Grandmother    Hypertension Paternal Grandfather    She indicated that her mother is alive. She indicated that her father is alive. She indicated that her brother is alive. She indicated that the status of her maternal grandmother is unknown. She  indicated that the status of her maternal grandfather is unknown. She indicated that the status of her paternal grandmother is unknown. She indicated that the status of her paternal grandfather is unknown. She indicated that her son is alive.  Social History    Social History   Socioeconomic History   Marital status: Single    Spouse name: Not on file   Number of children: 0   Years of education: 12+   Highest education level: Not on file  Occupational History    Employer: [employer]  Tobacco Use   Smoking status: Former    Packs/day: 0.50    Years: 5.00    Total pack years: 2.50    Types: Cigarettes   Smokeless tobacco: Never   Tobacco comments:    Quit smoking 07/2020  Vaping Use   Vaping Use: Never used  Substance  and Sexual Activity   Alcohol use: Yes    Comment: socially   Drug use: No   Sexual activity: Yes    Birth control/protection: None    Comment: last intercourse Jul 31 2014  Other Topics Concern   Not on file  Social History Narrative   Regular exercise-no   Caffeine Use-yes   Social Determinants of Health   Financial Resource Strain: Not on file  Food Insecurity: Not on file  Transportation Needs: Not on file  Physical Activity: Not on file  Stress: Not on file  Social Connections: Not on file  Intimate Partner Violence: Not on file     Review of Systems    General:  No chills, fever, night sweats or weight changes.  Cardiovascular:  No chest pain, dyspnea on exertion, edema, orthopnea, palpitations, paroxysmal nocturnal dyspnea. Dermatological: No rash, lesions/masses Respiratory: No cough, dyspnea Urologic: No hematuria, dysuria Abdominal:   No nausea, vomiting, diarrhea, bright red blood per rectum, melena, or hematemesis Neurologic:  No visual changes, wkns, changes in mental status. All other systems reviewed and are otherwise negative except as noted above.  Physical Exam    VS:  BP (!) 148/98   Pulse 82   Ht $R'5\' 5"'Ss$  (1.651 m)    Wt 260 lb 3.2 oz (118 kg)   SpO2 98%   BMI 43.30 kg/m  , BMI Body mass index is 43.3 kg/m. GEN: Well nourished, well developed, in no acute distress. HEENT: normal. Neck: Supple, no JVD, carotid bruits, or masses. Cardiac: RRR, no murmurs, rubs, or gallops. No clubbing, cyanosis, edema.  Radials/DP/PT 2+ and equal bilaterally.  Respiratory:  Respirations regular and unlabored, clear to auscultation bilaterally. GI: Soft, nontender, nondistended, BS + x 4. MS: no deformity or atrophy. Skin: warm and dry, no rash. Neuro:  Strength and sensation are intact. Psych: Normal affect.  Accessory Clinical Findings    Recent Labs: 05/02/2021: ALT 22; B Natriuretic Peptide 47.8; Magnesium 2.0; TSH 0.916 05/03/2021: BUN 10; Creatinine, Ser 1.01; Hemoglobin 14.2; Platelets 188; Potassium 4.0; Sodium 136   Recent Lipid Panel    Component Value Date/Time   CHOL 156 08/08/2012 0903   TRIG 198.0 (H) 08/08/2012 0903   HDL 34.90 (L) 08/08/2012 0903   CHOLHDL 4 08/08/2012 0903   VLDL 39.6 08/08/2012 0903   LDLCALC 82 08/08/2012 0903    HYPERTENSION CONTROL Vitals:   04/21/22 0759 04/21/22 0828  BP: (!) 182/120 (!) 148/98    The patient's blood pressure is elevated above target today.  In order to address the patient's elevated BP: Blood pressure will be monitored at home to determine if medication changes need to be made.; A new medication was prescribed today.; Follow up with primary care provider for management.       ECG personally reviewed by me today-none today.  Echocardiogram 05/03/2021  IMPRESSIONS     1. Left ventricular ejection fraction, by estimation, is 55 to 60%. The  left ventricle has normal function. The left ventricle has no regional  wall motion abnormalities. There is moderate concentric left ventricular  hypertrophy. Left ventricular  diastolic parameters are indeterminate.   2. Right ventricular systolic function is normal. The right ventricular  size is  normal. There is normal pulmonary artery systolic pressure. The  estimated right ventricular systolic pressure is 72.0 mmHg.   3. The mitral valve is grossly normal. Trivial mitral valve  regurgitation. No evidence of mitral stenosis.   4. The aortic valve is tricuspid. Aortic valve regurgitation  is not  visualized. No aortic stenosis is present.   5. The inferior vena cava is normal in size with greater than 50%  respiratory variability, suggesting right atrial pressure of 3 mmHg.   Comparison(s): No significant change from prior study. Assessment & Plan   1.  Abnormal EKG-heart rate today 82 and regular.  Patient had routine follow-up and EKG at PCP office.  Abnormal EKG was noted she was asked to present to the emergency department for further evaluation.  Upon further review her EKG changes were not new.  She denied chest pain, shortness of breath, lower extremity swelling and weakness. Continue amlodipine, metoprolol Heart healthy low-sodium diet-salty 6 given Increase physical activity as tolerated No plans for ischemic evaluation at this time.  Atrial flutter-heart rate today 82.  Denies recurrence of accelerated or irregular heartbeat.  She was seen by cardiology 8/18 after significant alcohol consumption and feeling poorly as a result.  She was noted to be in atrial flutter again.  Plan was made for TEE cardioversion however she spontaneously converted.  She wore a cardiac event monitor 03/18/2020 which showed rare ectopy, no atrial fibrillation or atrial flutter and a 4 beat run of NSVT. Continue metoprolol Heart healthy low-sodium diet-salty 6 given Increase physical activity as tolerated Avoid triggers caffeine, chocolate, EtOH, dehydration etc.  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  She was noted to have previous atrial flutter and reduced EF 4 months after her delivery of her son.  Her EF and recovery.  Her most recent echocardiogram showed normal  ejection fraction, moderate LVH which was felt to be secondary to elevated blood pressure.  Prior to subsequent pregnancies it was recommended that she undergo cardiac MRI for better understanding of her disease process infiltrative process versus scarring from prior myocarditis or scad. Continue metoprolol Heart healthy low-sodium diet-salty 6 given Increase physical activity as tolerated  Essential hypertension-BP today 148/98.  Working with PCP on managing blood pressure. Continue amlodipine, metoprolol Increase HCTZ to 50 mg  Heart healthy low-sodium diet-salty 6 given Increase physical activity as tolerated BMP in 1 wk  Day time somnolence/fatigue- waking up tired and reports snoring.  STOP-BANG score 5.  Epworth score 13. Order split night sleep study  Disposition: Follow-up with Dr. Margaretann Loveless in 3-4 months.   Jossie Ng. Salome Hautala NP-C     04/21/2022, 8:36 AM [place] Office [phone] Fax [phone]  Notice: This dictation was prepared with Dragon dictation along with smaller phrase technology. Any transcriptional errors that result from this process are unintentional and may not be corrected upon review.  I spent 15 minutes examining this patient, reviewing medications, and using patient centered shared decision making involving her cardiac care.  Prior to her visit I spent greater than 20 minutes reviewing her past medical history,  medications, and prior cardiac tests.

## 2022-04-21 ENCOUNTER — Encounter: Payer: Self-pay | Admitting: General Practice

## 2022-04-21 ENCOUNTER — Telehealth: Payer: Self-pay | Admitting: General Practice

## 2022-04-21 ENCOUNTER — Ambulatory Visit: Payer: BC Managed Care – PPO | Attending: Internal Medicine | Admitting: General Practice

## 2022-04-21 VITALS — BP 148/98 | HR 82 | Ht 65.0 in | Wt 260.2 lb

## 2022-04-21 DIAGNOSIS — O903 Peripartum cardiomyopathy: Secondary | ICD-10-CM | POA: Diagnosis not present

## 2022-04-21 DIAGNOSIS — I10 Essential (primary) hypertension: Secondary | ICD-10-CM

## 2022-04-21 DIAGNOSIS — R9431 Abnormal electrocardiogram [ECG] [EKG]: Secondary | ICD-10-CM

## 2022-04-21 DIAGNOSIS — I4892 Unspecified atrial flutter: Secondary | ICD-10-CM

## 2022-04-21 DIAGNOSIS — R400 Somnolence: Secondary | ICD-10-CM

## 2022-04-21 MED ORDER — HYDROCHLOROTHIAZIDE 50 MG PO TABS: 50.0000 mg | ORAL_TABLET | Freq: Every day | ORAL | 6 refills | Status: DC

## 2022-04-21 NOTE — Patient Instructions (Signed)
Medication Instructions:  TAKE HYDROCHLOROTHIAZIDE 50MG  DAILY  MAY TAKE B-COMPLEX VITAMINS  *If you need a refill on your cardiac medications before your next appointment, please call your pharmacy*  Lab Work: BMET IN 1 WEEK  If you have labs (blood work) drawn today and your tests are completely normal, you will receive your results only by:  1-MyChart Message (if you have MyChart) OR 2-A paper copy in the mail.  If you have any lab test that is abnormal or we need to change your treatment, we will call you to review the results.  You may also go to any of these [facility] locations:  [facility] [id number] 03[id number],  [employer] [address] ([facility])  [address]  [phone] [address]  [place]   [address] [place] [address]  [place] [address]   2- [address] [employer] [address] [place]  2- [address] [place])  Testing/Procedures: Your physician has recommended that you have a sleep study-SOMEONE WILL BE CALLING YOU TO SCHEDULE. This test records several body functions during sleep, including: brain activity, eye movement, oxygen and carbon dioxide blood levels, heart rate and rhythm, breathing rate and rhythm, the flow of air through your mouth and nose, snoring, body muscle movements, and chest and belly movement.  Special Instructions TAKE AND LOG YOU BLOOD PRESSURE  PLEASE READ AND FOLLOW SALTY 6-ATTACHED-1,800 mg daily  PLEASE INCREASE PHYSICAL ACTIVITY AS TOLERATED-START WITH 5 MINUTES THREE TIMES DAILY  Please try to avoid these triggers: Do not use any products that have nicotine or tobacco in them. These include cigarettes, e-cigarettes, and chewing tobacco. If you need help quitting, ask your doctor. Eat heart-healthy foods. Talk with your doctor about the right eating plan for you. Exercise regularly as told by your doctor. Stay  hydrated Do not drink alcohol, Caffeine or chocolate, ENERGY DRINKS Lose weight if you are overweight. Do not use drugs, including cannabis     Follow-Up: Your next appointment:  3-4 month(s) In Person with Chief Technology Officer, MD    At [facility], you and your health needs are our priority.  As part of our continuing mission to provide you with exceptional heart care, we have created designated Provider Care Teams.  These Care Teams include your primary Cardiologist (physician) and Advanced Practice Providers (APPs -  Physician Assistants and Nurse Practitioners) who all work together to provide you with the care you need, when you need it.  Important Information About Sugar

## 2022-04-21 NOTE — Telephone Encounter (Signed)
Patient requesting a letter excusing her from class this evening at 5:30 pm due to her high BP and headache.

## 2022-04-22 ENCOUNTER — Other Ambulatory Visit: Payer: Self-pay | Admitting: Nurse Practitioner

## 2022-04-22 ENCOUNTER — Other Ambulatory Visit: Payer: Self-pay

## 2022-04-22 ENCOUNTER — Telehealth: Payer: Self-pay | Admitting: *Deleted

## 2022-04-22 ENCOUNTER — Other Ambulatory Visit (HOSPITAL_COMMUNITY)
Admission: RE | Admit: 2022-04-22 | Discharge: 2022-04-22 | Disposition: A | Discharge status: Home / Self Care | Payer: BC Managed Care – PPO | Source: Ambulatory Visit | Attending: Nurse Practitioner | Admitting: Nurse Practitioner

## 2022-04-22 DIAGNOSIS — Z30431 Encounter for routine checking of intrauterine contraceptive device: Secondary | ICD-10-CM | POA: Diagnosis not present

## 2022-04-22 DIAGNOSIS — N9089 Other specified noninflammatory disorders of vulva and perineum: Secondary | ICD-10-CM | POA: Diagnosis not present

## 2022-04-22 DIAGNOSIS — Z124 Encounter for screening for malignant neoplasm of cervix: Secondary | ICD-10-CM | POA: Diagnosis not present

## 2022-04-22 DIAGNOSIS — Z01419 Encounter for gynecological examination (general) (routine) without abnormal findings: Secondary | ICD-10-CM | POA: Diagnosis not present

## 2022-04-22 DIAGNOSIS — L83 Acanthosis nigricans: Secondary | ICD-10-CM | POA: Diagnosis not present

## 2022-04-22 DIAGNOSIS — Z9189 Other specified personal risk factors, not elsewhere classified: Secondary | ICD-10-CM | POA: Diagnosis not present

## 2022-04-22 DIAGNOSIS — N907 Vulvar cyst: Secondary | ICD-10-CM | POA: Diagnosis not present

## 2022-04-22 DIAGNOSIS — Z7251 High risk heterosexual behavior: Secondary | ICD-10-CM | POA: Diagnosis not present

## 2022-04-22 NOTE — Telephone Encounter (Signed)
Returned call to pt she states that she is still having a headache and her BP is elevated. She took her medication (without HCTZ) 185/126. She just picked up her the HCTZ at the pharmacy. She will take BP again in one hour for your review at that time. she would like to be out of class through Friday. Is this ok? Informed pt that Verdon Cummins is not in the office today but will be reviewing messages intermittently. Verbalized understanding

## 2022-04-22 NOTE — Telephone Encounter (Signed)
Letter written

## 2022-04-22 NOTE — Telephone Encounter (Signed)
Per [employer] no PA is required for split night sleep study. Call reference # [id number].

## 2022-04-22 NOTE — Telephone Encounter (Signed)
Follow Up:     Patient is calling to ask if letter can sate that she will be out of class through Friday. She  says  her head is still aching and her blood pressure is still up.

## 2022-04-23 NOTE — Telephone Encounter (Signed)
Pt informed of providers recommendations. No further questions .

## 2022-04-26 ENCOUNTER — Ambulatory Visit: Payer: BC Managed Care – PPO | Admitting: Behavioral Health

## 2022-04-26 LAB — CYTOLOGY - PAP
Comment: NEGATIVE
Diagnosis: NEGATIVE
High risk HPV: NEGATIVE

## 2022-05-11 DIAGNOSIS — F341 Dysthymic disorder: Secondary | ICD-10-CM | POA: Diagnosis not present

## 2022-05-11 DIAGNOSIS — F900 Attention-deficit hyperactivity disorder, predominantly inattentive type: Secondary | ICD-10-CM | POA: Diagnosis not present

## 2022-05-18 ENCOUNTER — Ambulatory Visit (HOSPITAL_BASED_OUTPATIENT_CLINIC_OR_DEPARTMENT_OTHER): Payer: BC Managed Care – PPO | Attending: General Practice | Admitting: Cardiology

## 2022-05-18 DIAGNOSIS — R400 Somnolence: Secondary | ICD-10-CM | POA: Diagnosis not present

## 2022-05-18 DIAGNOSIS — G4733 Obstructive sleep apnea (adult) (pediatric): Secondary | ICD-10-CM | POA: Diagnosis not present

## 2022-05-19 NOTE — Procedures (Signed)
   Patient Name: Tracy Grant, Tracy Grant Date:05/18/2022 Gender: Female D.O.B: 04/29/1986 Age (years): 35 Referring Provider: Coletta Memos NP Height (inches): 31 Interpreting Physician: Fransico Him MD, ABSM Weight (lbs): 260 RPSGT: Gwenyth Allegra BMI: 43 MRN: [id number] Neck Size: 15.00  CLINICAL INFORMATION Sleep Study Type: NPSG  Indication for sleep study: Hypertension  Epworth Sleepiness Score: 10  SLEEP STUDY TECHNIQUE As per the AASM Manual for the Scoring of Sleep and Associated Events v2.3 (April 2016) with a hypopnea requiring 4% desaturations.  The channels recorded and monitored were frontal, central and occipital EEG, electrooculogram (EOG), submentalis EMG (chin), nasal and oral airflow, thoracic and abdominal wall motion, anterior tibialis EMG, snore microphone, electrocardiogram, and pulse oximetry.  MEDICATIONS Medications self-administered by patient taken the night of the study : TRAZODONE  SLEEP ARCHITECTURE The study was initiated at 11:15:27 PM and ended at 5:18:51 AM.  Sleep onset time was 100.2 minutes and the sleep efficiency was 70.9%. The total sleep time was 257.7 minutes.  Stage REM latency was 107.0 minutes.  The patient spent 1.9% of the night in stage N1 sleep, 78.9% in stage N2 sleep, 0.0% in stage N3 and 19.2% in REM.  Alpha intrusion was absent.  Supine sleep was 63.70%.  RESPIRATORY PARAMETERS The overall apnea/hypopnea index (AHI) was 11.2 per hour. There were 8 total apneas, including 8 obstructive, 0 central and 0 mixed apneas. There were 40 hypopneas and 22 RERAs.  The AHI during Stage REM sleep was 38.8 per hour.  AHI while supine was 7.3 per hour.  The mean oxygen saturation was 92.7%. The minimum SpO2 during sleep was 82.0%.  loud snoring was noted during this study.  CARDIAC DATA The 2 lead EKG demonstrated sinus rhythm. The mean heart rate was 70.8 beats per minute. Other EKG findings include: None.  LEG MOVEMENT  DATA The total PLMS were 0 with a resulting PLMS index of 0.0. Associated arousal with leg movement index was 0.0 .  IMPRESSIONS - Mild obstructive sleep apnea occurred during this study (AHI = 11.2/h) overall and severe during REM sleep (AHI = 38.8/hr). - Mild oxygen desaturation was noted during this study (Min O2 = 82.0%). - The patient snored with loud snoring volume. - No cardiac abnormalities were noted during this study. - Clinically significant periodic limb movements did not occur during sleep. No significant associated arousals.  DIAGNOSIS - Obstructive Sleep Apnea (G47.33)  RECOMMENDATIONS - Therapeutic CPAP titration to determine optimal pressure required to alleviate sleep disordered breathing. - Avoid alcohol, sedatives and other CNS depressants that may worsen sleep apnea and disrupt normal sleep architecture. - Sleep hygiene should be reviewed to assess factors that may improve sleep quality. - Weight management and regular exercise should be initiated or continued if appropriate.  [Electronically signed] 05/19/2022 03:28 PM  Fransico Him MD, ABSM Diplomate, American Board of Sleep Medicine

## 2022-05-25 ENCOUNTER — Telehealth: Payer: Self-pay | Admitting: *Deleted

## 2022-05-25 DIAGNOSIS — R400 Somnolence: Secondary | ICD-10-CM

## 2022-05-25 DIAGNOSIS — G4733 Obstructive sleep apnea (adult) (pediatric): Secondary | ICD-10-CM

## 2022-05-25 NOTE — Telephone Encounter (Signed)
-----   Message from Lauralee Evener, [place] sent at 05/21/2022 10:02 AM EDT -----  ----- Message ----- From: Sueanne Margarita, MD Sent: 05/19/2022   3:29 PM EDT To: Cv Div Sleep Studies  Please let patient know that they have sleep apnea.  Recommend therapeutic CPAP titration for treatment of patient's sleep disordered breathing.  If unable to perform an in lab titration then initiate ResMed auto CPAP from 4 to 15cm H2O with heated humidity and mask of choice and overnight pulse ox on CPAP.

## 2022-05-25 NOTE — Telephone Encounter (Signed)
The patient has been notified of the result and verbalized understanding.  All questions (if any) were answered. Marolyn Hammock, CMA 05/25/2022 59:56 PM    Will precert cpap titration

## 2022-06-07 NOTE — Progress Notes (Signed)
Cardiology Clinic Note   Patient Name: Tracy Grant Date of Encounter: 06/08/2022  Primary Care Provider:  Scheryl Marten, [place] Primary Cardiologist:  Elouise Munroe, MD  Patient Profile    Tracy Grant 36 year old female presents to the clinic today for follow-up evaluation of her HTN.  Past Medical History    Past Medical History:  Diagnosis Date   Allergy    Bipolar disorder ([place]) 2014   Cardiomyopathy ([place])    a. Echo (10/15):  EF 35-40%, mild MR, mild to moderately reduced RV systolic function, PASP 31 mmHg  ; b.  Echo (12/15):  Posterior basal HK, mild LVH, EF 55%, mild LAE.   Headache(784.0)    History of chicken pox    Hx of migraines    Hyperlipidemia    Hypertension    Infection    trich   Past Surgical History:  Procedure Laterality Date   ADENOIDECTOMY     CESAREAN SECTION N/A 01/17/2014   Procedure: CESAREAN SECTION;  Surgeon: Shelly Bombard, MD;  Location: [facility];  Service: Obstetrics;  Laterality: N/A;   DILATION AND CURETTAGE OF UTERUS      Allergies  Allergies  Allergen Reactions   Lisinopril Swelling    History of Present Illness    [name] has a PMH of migraine, atrial flutter, dilated cardiomyopathy, HTN, LVH, atrial flutter, morbid obesity, palpitations, bipolar disorder, anxiety and depression.  She presented to the [facility] on 04/12/2022.  She was noted to have an abnormal EKG.  She indicated that she had been to her doctor for routine follow-up and blood pressure management.  Her doctor has been adjusting her blood pressure medication for better blood pressure control.  An EKG was checked and she was noted to have abnormal EKG.  She was sent to the emergency department for further evaluation.  She denied chest pain.  She reported that she had some mild shortness of breath however it was at baseline.  She indicated that she had slight shortness of breath since having COVID infection a few years prior.   She denied changes or worsening shortness of breath.  She denied lower extremity swelling, weakness, headache, visual changes, and numbness.  She indicated that she had a follow-up appointment with cardiology the following week.  Her blood pressure was 168/123.  Her pulse was 85.  Her EKG showed normal sinus rhythm with nonspecific intraventricular conduction block, inferior infarct undetermined age, and T wave abnormality.  There were no significant changes since her EKG on 04/12/22 at 11 AM.  Her EKG was compared to her EKG from September 2022.  She was noted to have some T wave changes and and intraventricular conduction delay.  Due to unchanged EKG and plan follow-up with cardiology it was not felt that she needed further work-up at that time.  She presented to the clinic 04/21/22 for follow-up evaluation stated she had noticed brief episodes of fast heart rate that are occasional as well as brief episodes of chest pressure that last for seconds and dissipated on their own.  We reviewed her most recent visit to the [facility] and her blood pressure.  She reported that she took her medications that morning around an hour before her clinic visit.  Initially her blood pressure is 182/120 on recheck it is 148/98.  We reviewed the importance of avoiding high salt diet, increasing physical activity, avoiding sports drinks, caffeine, and secondary causes of hypertension.  She reported  that she  had high blood pressure since she was preeclamptic after having her son.  At that time she had IUD implantation and noted normal blood pressure since that time.  I asked her to follow-up with her OB/GYN about other options for birth control with consideration for her high blood pressure.  She had an appointment with her OB the next day and expressed understanding.  I had asked her to stop drinking caffeine and sports drinks.  I  increased her HCTZ to 50 mg ordered a BMP in 1 week.  She also reported daytime  somnolence and loud snoring.  Her STOP-BANG score was 5.  I will ordered a split-night sleep study and had her follow-up in 3 to 4 months.  She presents to the clinic today for follow-up evaluation and states her blood pressure has been much better with the addition of HCTZ.  Her blood pressure in the clinic today is 126/86.  She talked with her OB/GYN who did not feel that her contraception would affect her blood pressure.  She has reduced the sodium in her diet.  She underwent split-night sleep study which showed sleep apnea with an AHI of 11.2.  She is working with sleep medicine.  She asks about gastric sleeve surgery.  Her PCP prescribed a atorvastatin which she did not tolerate well.  She discontinued the medication.  I encouraged her to work on diet (high-fiber/reduce cholesterol) and exercise.  We reviewed semaglutide medication but she may ask her PCP about.  I will have her continue to monitor her blood pressure, increase her physical activity as tolerated, and plan follow-up 6 months.  Today she denies chest pain, shortness of breath, lower extremity edema, fatigue, palpitations, melena, hematuria, hemoptysis, diaphoresis, weakness, presyncope, syncope, and orthopnea.    Home Medications    Prior to Admission medications   Medication Sig Start Date End Date Taking? Authorizing Provider  acetaminophen (TYLENOL) 500 MG tablet Take 1,000 mg by mouth every 6 (six) hours as needed for moderate pain or headache.    [provider]  amLODipine (NORVASC) 10 MG tablet Take 1 tablet (10 mg total) by mouth daily. 05/04/21 06/03/21  Alma Friendly, MD  Blood Pressure KIT Check BP daily 05/03/21   Alma Friendly, MD  FLUoxetine (PROZAC) 20 MG capsule Take 20 mg by mouth daily.    [provider]  guaiFENesin (MUCINEX PO) Take 20 mLs by mouth 2 (two) times daily as needed (congestion).    [provider]  levonorgestrel (MIRENA) 20 MCG/24HR IUD 1 each by  Intrauterine route once.    [provider]  metoprolol succinate (TOPROL-XL) 25 MG 24 hr tablet Take 1 tablet (25 mg total) by mouth daily. 05/04/21 06/03/21  Alma Friendly, MD  traZODone (DESYREL) 50 MG tablet Take 50 mg by mouth at bedtime as needed for sleep. 12/08/19   [provider]    Family History    Family History  Problem Relation Age of Onset   Hypertension Mother    Heart attack Mother        received stent in her early 77s   Hypertension Father    Hypertension Brother    Hypertension Maternal Grandmother    Hypertension Maternal Grandfather    Hypertension Paternal Grandmother    Hypertension Paternal Grandfather    She indicated that her mother is alive. She indicated that her father is alive. She indicated that her brother is alive. She indicated that the status of her  maternal grandmother is unknown. She indicated that the status of her maternal grandfather is unknown. She indicated that the status of her paternal grandmother is unknown. She indicated that the status of her paternal grandfather is unknown. She indicated that her son is alive.  Social History    Social History   Socioeconomic History   Marital status: Single    Spouse name: Not on file   Number of children: 0   Years of education: 12+   Highest education level: Not on file  Occupational History    Employer: [employer]  Tobacco Use   Smoking status: Former    Packs/day: 0.50    Years: 5.00    Total pack years: 2.50    Types: Cigarettes   Smokeless tobacco: Never   Tobacco comments:    Quit smoking 07/2020  Vaping Use   Vaping Use: Never used  Substance and Sexual Activity   Alcohol use: Yes    Comment: socially   Drug use: No   Sexual activity: Yes    Birth control/protection: None    Comment: last intercourse Jul 31 2014  Other Topics Concern   Not on file  Social History Narrative   Regular exercise-no   Caffeine Use-yes   Social Determinants of  Health   Financial Resource Strain: Not on file  Food Insecurity: Not on file  Transportation Needs: Not on file  Physical Activity: Not on file  Stress: Not on file  Social Connections: Not on file  Intimate Partner Violence: Not on file     Review of Systems    General:  No chills, fever, night sweats or weight changes.  Cardiovascular:  No chest pain, dyspnea on exertion, edema, orthopnea, palpitations, paroxysmal nocturnal dyspnea. Dermatological: No rash, lesions/masses Respiratory: No cough, dyspnea Urologic: No hematuria, dysuria Abdominal:   No nausea, vomiting, diarrhea, bright red blood per rectum, melena, or hematemesis Neurologic:  No visual changes, wkns, changes in mental status. All other systems reviewed and are otherwise negative except as noted above.  Physical Exam    VS:  BP 126/86   Pulse 69   Ht 5\' 5"  (1.651 m)   Wt 266 lb 3.2 oz (120.7 kg)   SpO2 98%   BMI 44.30 kg/m  , BMI Body mass index is 44.3 kg/m. GEN: Well nourished, well developed, in no acute distress. HEENT: normal. Neck: Supple, no JVD, carotid bruits, or masses. Cardiac: RRR, no murmurs, rubs, or gallops. No clubbing, cyanosis, edema.  Radials/DP/PT 2+ and equal bilaterally.  Respiratory:  Respirations regular and unlabored, clear to auscultation bilaterally. GI: Soft, nontender, nondistended, BS + x 4. MS: no deformity or atrophy. Skin: warm and dry, no rash. Neuro:  Strength and sensation are intact. Psych: Normal affect.  Accessory Clinical Findings    Recent Labs: No results found for requested labs within last 365 days.   Recent Lipid Panel    Component Value Date/Time   CHOL 156 08/08/2012 0903   TRIG 198.0 (H) 08/08/2012 0903   HDL 34.90 (L) 08/08/2012 0903   CHOLHDL 4 08/08/2012 0903   VLDL 39.6 08/08/2012 0903   LDLCALC 82 08/08/2012 0903         ECG personally reviewed by me today-none today.  Echocardiogram 05/03/2021  IMPRESSIONS     1. Left ventricular  ejection fraction, by estimation, is 55 to 60%. The  left ventricle has normal function. The left ventricle has no regional  wall motion abnormalities. There is moderate concentric left ventricular  hypertrophy. Left ventricular  diastolic parameters are indeterminate.   2. Right ventricular systolic function is normal. The right ventricular  size is normal. There is normal pulmonary artery systolic pressure. The  estimated right ventricular systolic pressure is 56.3 mmHg.   3. The mitral valve is grossly normal. Trivial mitral valve  regurgitation. No evidence of mitral stenosis.   4. The aortic valve is tricuspid. Aortic valve regurgitation is not  visualized. No aortic stenosis is present.   5. The inferior vena cava is normal in size with greater than 50%  respiratory variability, suggesting right atrial pressure of 3 mmHg.   Comparison(s): No significant change from prior study. Assessment & Plan   1.  Essential hypertension-BP today 123/86.  Working with PCP on managing blood pressure. Continue amlodipine, metoprolol Continue HCTZ to 50 mg  Heart healthy low-sodium diet-salty 6 reviewed Increase physical activity as tolerated Referral to [facility] weight [facility] wellness-BMI 44  OSA-underwent split-night sleep study which showed 11.2 AHI.  Patient notified of results of split-night sleep study.  Awaiting titration study.   Follows with Dr. Radford Pax Continue weight loss  Abnormal EKG-heart rate today 69 bpm and regular.   Continue amlodipine, metoprolol Heart healthy low-sodium diet Increase physical activity as tolerated No plans for ischemic evaluation at this time, continue to monitor.  Atrial flutter-heart rate today 69.  Denies recurrence of accelerated or irregular heartbeat.  She was seen by cardiology 8/18 after significant alcohol consumption and feeling poorly as a result.  She was noted to be in atrial flutter again.  Plan was made for TEE cardioversion however she  spontaneously converted.  She wore a cardiac event monitor 03/18/2020 which showed rare ectopy, no atrial fibrillation or atrial flutter and a 4 beat run of NSVT. Continue metoprolol Heart healthy low-sodium diet Increase physical activity as tolerated Avoid triggers caffeine, chocolate, EtOH, dehydration etc.  Postpartum cardiomyopathy-stable.  She was previously noted to have previous atrial flutter and reduced EF 4 months after her delivery of her son.    Most recent echocardiogram showed normal ejection fraction, moderate LVH which was felt to be secondary to elevated blood pressure.  Prior to subsequent pregnancies it was recommended that she undergo cardiac MRI for better understanding of her disease process infiltrative process versus scarring from prior myocarditis or scad. Continue metoprolol Heart healthy low-sodium diet-salty 6 reviewed  Increase physical activity as tolerated  Disposition: Follow-up with Dr. Margaretann Loveless in 4-6 months.   Jossie Ng. Curvin Hunger NP-C     06/08/2022, 9:19 AM [place] [address] Office [phone] Fax [phone]  Notice: This dictation was prepared with Dragon dictation along with smaller phrase technology. Any transcriptional errors that result from this process are unintentional and may not be corrected upon review.  I spent 14 minutes examining this patient, reviewing medications, and using patient centered shared decision making involving her cardiac care.  Prior to her visit I spent greater than 20 minutes reviewing her past medical history,  medications, and prior cardiac tests.

## 2022-06-08 ENCOUNTER — Ambulatory Visit: Payer: BC Managed Care – PPO | Attending: General Practice | Admitting: General Practice

## 2022-06-08 ENCOUNTER — Encounter: Payer: Self-pay | Admitting: General Practice

## 2022-06-08 VITALS — BP 126/86 | HR 69 | Ht 65.0 in | Wt 266.2 lb

## 2022-06-08 DIAGNOSIS — I10 Essential (primary) hypertension: Secondary | ICD-10-CM | POA: Diagnosis not present

## 2022-06-08 DIAGNOSIS — I4892 Unspecified atrial flutter: Secondary | ICD-10-CM

## 2022-06-08 DIAGNOSIS — G4733 Obstructive sleep apnea (adult) (pediatric): Secondary | ICD-10-CM | POA: Diagnosis not present

## 2022-06-08 DIAGNOSIS — R9431 Abnormal electrocardiogram [ECG] [EKG]: Secondary | ICD-10-CM

## 2022-06-08 DIAGNOSIS — O903 Peripartum cardiomyopathy: Secondary | ICD-10-CM

## 2022-06-08 NOTE — Patient Instructions (Signed)
Medication Instructions:  The current medical regimen is effective;  continue present plan and medications as directed. Please refer to the Current Medication list given to you today.  *If you need a refill on your cardiac medications before your next appointment, please call your pharmacy*   Lab Work: NONE  Testing/Procedures: NONE  Other Instructions REFERRAL TO HEALTHY WEIGHT AND WELLNESS  DISCUSS OZEMPIC/WEGOVY WITH YOUR PRIMARY MD   Follow-Up: At [facility], you and your health needs are our priority.  As part of our continuing mission to provide you with exceptional heart care, we have created designated Provider Care Teams.  These Care Teams include your primary Cardiologist (physician) and Advanced Practice Providers (APPs -  Physician Assistants and Nurse Practitioners) who all work together to provide you with the care you need, when you need it.  Your next appointment:   6 month(s)  The format for your next appointment:   In Person  Provider:   Elouise Munroe, MD     Important Information About Sugar

## 2022-07-19 ENCOUNTER — Ambulatory Visit (HOSPITAL_BASED_OUTPATIENT_CLINIC_OR_DEPARTMENT_OTHER): Payer: BC Managed Care – PPO | Attending: Cardiology | Admitting: Cardiology

## 2022-07-19 VITALS — Ht 65.0 in | Wt 265.0 lb

## 2022-07-19 DIAGNOSIS — R400 Somnolence: Secondary | ICD-10-CM | POA: Diagnosis not present

## 2022-07-19 DIAGNOSIS — G4733 Obstructive sleep apnea (adult) (pediatric): Secondary | ICD-10-CM | POA: Diagnosis not present

## 2022-07-20 NOTE — Procedures (Signed)
   Patient Name: Tracy Grant, [place] Date: 07/19/2022 Gender: Female D.O.B: 03-Jun-1986 Age (years): 26 Referring Provider: Armanda Magic MD, ABSM Height (inches): 65 Interpreting Physician: Armanda Magic MD, ABSM Weight (lbs): 265 RPSGT: [name] BMI: 44 MRN: [id number] Neck Size: 15.00  CLINICAL INFORMATION The patient is referred for a CPAP titration to treat sleep apnea.  SLEEP STUDY TECHNIQUE As per the AASM Manual for the Scoring of Sleep and Associated Events v2.3 (April 2016) with a hypopnea requiring 4% desaturations.  The channels recorded and monitored were frontal, central and occipital EEG, electrooculogram (EOG), submentalis EMG (chin), nasal and oral airflow, thoracic and abdominal wall motion, anterior tibialis EMG, snore microphone, electrocardiogram, and pulse oximetry. Continuous positive airway pressure (CPAP) was initiated at the beginning of the study and titrated to treat sleep-disordered breathing.  MEDICATIONS Medications self-administered by patient taken the night of the study : TRAZODONE  TECHNICIAN COMMENTS Comments added by technician: No restroom visted. Patient had difficulty initiating sleep. Comments added by scorer: N/A  RESPIRATORY PARAMETERS Optimal PAP Pressure (cm):12  AHI at Optimal Pressure (/hr):0 Overall Minimal O2 (%):89.0  Supine % at Optimal Pressure (%):69 Minimal O2 at Optimal Pressure (%): 91.0   SLEEP ARCHITECTURE The study was initiated at 10:13:53 PM and ended at 4:51:27 AM.  Sleep onset time was 67.2 minutes and the sleep efficiency was 81.5%. The total sleep time was 324 minutes.  The patient spent 1.4% of the night in stage N1 sleep, 63.4% in stage N2 sleep, 4.3% in stage N3 and 30.9% in REM.Stage REM latency was 32.0 minutes  Wake after sleep onset was 6.3. Alpha intrusion was absent. Supine sleep was 61.76%.  CARDIAC DATA The 2 lead EKG demonstrated sinus rhythm. The mean heart rate was 80.8 beats per minute.  Other EKG findings include: None.  LEG MOVEMENT DATA The total Periodic Limb Movements of Sleep (PLMS) were 0. The PLMS index was 0.0. A PLMS index of <15 is considered normal in adults.  IMPRESSIONS - The optimal PAP pressure was 12 cm of water. - Mild oxygen desaturations were observed during this titration (min O2 = 89.0%). - No snoring was audible during this study. - No cardiac abnormalities were observed during this study. - Clinically significant periodic limb movements were not noted during this study. Arousals associated with PLMs were rare.  DIAGNOSIS - Obstructive Sleep Apnea (G47.33)  RECOMMENDATIONS - Trial of CPAP therapy on 12 cm H2O with a Medium size Resmed Full Face AirFit F20 mask and heated humidification. - Avoid alcohol, sedatives and other CNS depressants that may worsen sleep apnea and disrupt normal sleep architecture. - Sleep hygiene should be reviewed to assess factors that may improve sleep quality. - Weight management and regular exercise should be initiated or continued. - Return to [facility] Center for re-evaluation after 6 weeks of therapy  [Electronically signed] 07/20/2022 03:10 PM  Armanda Magic MD, ABSM Diplomate, American Board of Sleep Medicine

## 2022-07-26 ENCOUNTER — Telehealth: Payer: Self-pay | Admitting: *Deleted

## 2022-07-26 NOTE — Telephone Encounter (Signed)
-----   Message from Gaynelle Cage, [place] sent at 07/21/2022  8:04 AM EST -----  ----- Message ----- From: Quintella Reichert, MD Sent: 07/20/2022   3:12 PM EST To: Cv Div Sleep Studies  Please let patient know that they had a successful PAP titration and let DME know that orders are in EPIC.  Please set up 6 week OV with me.

## 2022-07-26 NOTE — Telephone Encounter (Signed)
The patient has been notified of the result. Left detailed message on voicemail and informed patient to call back..Tadeusz Stahl Green, CMA   

## 2022-07-27 NOTE — Telephone Encounter (Signed)
The patient has been notified of the result and verbalized understanding.  All questions (if any) were answered. Latrelle Dodrill, CMA 07/27/2022 12:46 PM    Upon patient request DME selection is [facility]. Patient understands he will be contacted by [facility] to set up his cpap. Patient understands to call if [facility] does not contact him with new setup in a timely manner. Patient understands they will be called once confirmation has been received from [facility]/ that they have received their new machine to schedule 10 week follow up appointment.   [facility] notified of new cpap order  Please add to airview Patient was grateful for the call and thanked me.

## 2022-08-03 ENCOUNTER — Telehealth: Payer: Self-pay

## 2022-08-03 NOTE — Patient Instructions (Signed)
Visit Information  Thank you for taking time to visit with me today. Please don't hesitate to contact me if I can be of assistance to you.   Following are the goals we discussed today:   Goals Addressed             This Visit's Progress    COMPLETED: Care Coordination Activities - no follow up required       Care Coordination Interventions: Advised patient to schedule appointment with provider if back issues do not improve Provided education to patient re: care coordination services Assessed social determinant of health barriers          If you are experiencing a Mental Health or Behavioral Health Crisis or need someone to talk to, please call the Suicide and Crisis Lifeline: 988 call the [place] National Suicide Prevention Lifeline: 587 241 0799 or TTY: 684-380-6244 TTY 928-122-9091) to talk to a trained counselor call 1-800-273-TALK (toll free, 24 hour hotline) go to [facility] [address] [place] [phone]) call 911   Patient verbalizes understanding of instructions and care plan provided today and agrees to view in MyChart. Active MyChart status and patient understanding of how to access instructions and care plan via MyChart confirmed with patient.     No further follow up required:    Dudley Major RN, Maximiano Coss, CDE Care Management Coordinator [facility] Care Management [phone]

## 2022-08-03 NOTE — Patient Outreach (Signed)
  Care Coordination   Initial Visit Note   08/03/2022 Name: Tracy Grant MRN: [id number] DOB: 1985/09/03  Tracy Grant is a 36 y.o. year old female who sees [place], [place], [place] for primary care. I spoke with  Tracy Grant by phone today.  What matters to the patients health and wellness today?  My back has been aching some but otherwise no concerns today    Goals Addressed             This Visit's Progress    COMPLETED: Care Coordination Activities - no follow up required       Care Coordination Interventions: Advised patient to schedule appointment with provider if back issues do not improve Provided education to patient re: care coordination services Assessed social determinant of health barriers          SDOH assessments and interventions completed:  Yes  SDOH Interventions Today    Flowsheet Row Most Recent Value  SDOH Interventions   Food Insecurity Interventions Intervention Not Indicated  Housing Interventions Intervention Not Indicated  Transportation Interventions Intervention Not Indicated  Utilities Interventions Intervention Not Indicated        Care Coordination Interventions:  Yes, provided   Follow up plan: No further intervention required.   Encounter Outcome:  Pt. Visit Completed  Dudley Major RN, BSN,CCM, CDE Care Management Coordinator [facility] Care Management [phone]

## 2022-09-02 DIAGNOSIS — G4733 Obstructive sleep apnea (adult) (pediatric): Secondary | ICD-10-CM | POA: Diagnosis not present

## 2022-09-03 DIAGNOSIS — F900 Attention-deficit hyperactivity disorder, predominantly inattentive type: Secondary | ICD-10-CM | POA: Diagnosis not present

## 2022-09-03 DIAGNOSIS — F341 Dysthymic disorder: Secondary | ICD-10-CM | POA: Diagnosis not present

## 2022-09-09 DIAGNOSIS — F900 Attention-deficit hyperactivity disorder, predominantly inattentive type: Secondary | ICD-10-CM | POA: Diagnosis not present

## 2022-09-09 DIAGNOSIS — F1020 Alcohol dependence, uncomplicated: Secondary | ICD-10-CM | POA: Diagnosis not present

## 2022-09-09 DIAGNOSIS — F341 Dysthymic disorder: Secondary | ICD-10-CM | POA: Diagnosis not present

## 2022-09-13 ENCOUNTER — Ambulatory Visit: Payer: BC Managed Care – PPO | Attending: Internal Medicine | Admitting: Internal Medicine

## 2022-09-13 ENCOUNTER — Encounter: Payer: Self-pay | Admitting: Internal Medicine

## 2022-09-13 VITALS — BP 136/78 | HR 67 | Ht 65.0 in | Wt 270.8 lb

## 2022-09-13 DIAGNOSIS — Z8759 Personal history of other complications of pregnancy, childbirth and the puerperium: Secondary | ICD-10-CM

## 2022-09-13 DIAGNOSIS — R079 Chest pain, unspecified: Secondary | ICD-10-CM

## 2022-09-13 DIAGNOSIS — I4892 Unspecified atrial flutter: Secondary | ICD-10-CM

## 2022-09-13 DIAGNOSIS — R9431 Abnormal electrocardiogram [ECG] [EKG]: Secondary | ICD-10-CM

## 2022-09-13 DIAGNOSIS — R002 Palpitations: Secondary | ICD-10-CM

## 2022-09-13 DIAGNOSIS — G4733 Obstructive sleep apnea (adult) (pediatric): Secondary | ICD-10-CM | POA: Diagnosis not present

## 2022-09-13 DIAGNOSIS — O903 Peripartum cardiomyopathy: Secondary | ICD-10-CM

## 2022-09-13 NOTE — Patient Instructions (Signed)
Medication Instructions:  No Changes In Medications at this time.  *If you need a refill on your cardiac medications before your next appointment, please call your pharmacy*  Follow-Up: At [facility], you and your health needs are our priority.  As part of our continuing mission to provide you with exceptional heart care, we have created designated Provider Care Teams.  These Care Teams include your primary Cardiologist (physician) and Advanced Practice Providers (APPs -  Physician Assistants and Nurse Practitioners) who all work together to provide you with the care you need, when you need it.  Your next appointment:   6 month(s)  Provider:   Gayatri A Acharya, MD    

## 2022-09-13 NOTE — Progress Notes (Addendum)
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  History of Present Illness:    Tracy Grant is a 37 y.o. female with a history of atrial flutter and reduced EF 4 months after the delivery of her son with subsequent recovery of EF, hypertension, tobacco abuse, morbid obesity.  She presents for follow-up.  Feeling well overall.  She does occasionally note palpitations with exertion and is trying to be more active and lose weight.  Her heart rate on her Apple Watch does not go over about 140 and she does not note irregularity.  We discussed that this is probably due to deconditioning but she should watch it closely.  When not exerting herself she will have some episodes of chest tightening that stops her in her tracks but does not experience this with exertion and does not experience it with elevated heart rates.  She does get short of breath with activity and also when singing karaoke and dancing.  She feels this is weight related since it did not happen in the past.  She is anticipating establishing with [facility] and I have encouraged this.  We discussed in detail risks and benefits of radiation based stress testing in 37 year old female with possible cardiac chest pain.  If she continues to exercise yet feels her symptoms are worsening, would consider coronary CTA at that point.  The calcium scoring associated with coronary CTA will also assist [place] in understanding how to manage her lipids, and if calcium scoring is ordered I would encourage a CTA to be performed instead.  She has started using a CPAP after a diagnosis of sleep apnea and notes this is  helping.  She feels less tired during the day.  Her son will be 9 soon and they are working on healthy lifestyle together.  PAST HISTORY: Initial cardiology notes from May 16, 2014 suggest a history of hyperlipidemia and prehypertension at that time, with a family history of her mother having an MI in her early 66s with stent, though after Dr. Tenny Craw spoke to her mother, this event may have been embolic in nature, and her mother eventually underwent catheter ablation for atrial flutter.  Patient gave birth in June 2015 with an uncomplicated pregnancy and C-section delivery.  Delivery was felt to be uncomplicated.  Patient noticed intermittent palpitations since her delivery lasting seconds to minutes without chest pain.  In September 2015 she experienced a significant respiratory infection with fever, chills, runny nose, cough.  Her cough persisted.  She at that time denied lower extremity edema, orthopnea, PND.  Just prior to presenting to the hospital she began to have PND with coughing, and was also noted to be tachycardic into the 150s.  She was found of atrial flutter with 2-1 conduction.  She underwent successful TEE cardioversion with 1 150 J shock on 05/17/2014. Despite viral symptoms, troponin negative, suggesting against myocarditis for cause of low EF.   On further review it was unclear whether her cardiomyopathy was peripartum cardiomyopathy or just tachycardia mediated cardiomyopathy.  Ejection fraction was felt to be 35 to 40%.  She was continued on medications for short period of time but did not have  insurance and discontinued these.  She was previously taking Xarelto but discontinued for the same reason.  Fortunately by December 2015 her EF normalized.  There was still a suggestion of posterior basal hypokinesis and mild LV dilatation.   Her next follow-up appointment appears to be October 2016.  She had a visit with EP, but did not pursue flutter ablation.  She was next seen by cardiology in  August 2018, after significant alcohol consumption and feeling poorly as a result.  She was noted to be again in atrial flutter, with plan for TEE cardioversion, however she spontaneously converted   Past Medical History:  Diagnosis Date   Allergy    Bipolar disorder ([place]) 2014   Cardiomyopathy ([place])    a. Echo (10/15):  EF 35-40%, mild MR, mild to moderately reduced RV systolic function, PASP 31 mmHg  ; b.  Echo (12/15):  Posterior basal HK, mild LVH, EF 55%, mild LAE.   Headache(784.0)    History of chicken pox    Hx of migraines    Hyperlipidemia    Hypertension    Infection    trich    Past Surgical History:  Procedure Laterality Date   ADENOIDECTOMY     CESAREAN SECTION N/A 01/17/2014   Procedure: CESAREAN SECTION;  Surgeon: Shelly Bombard, MD;  Location: [facility];  Service: Obstetrics;  Laterality: N/A;   DILATION AND CURETTAGE OF UTERUS      Current Medications: Current Meds  Medication Sig   acetaminophen (TYLENOL) 500 MG tablet Take 1,000 mg by mouth every 6 (six) hours as needed for moderate pain or headache.   amLODipine (NORVASC) 10 MG tablet Take 1 tablet (10 mg total) by mouth daily.   Blood Pressure KIT Check BP daily   FLUoxetine (PROZAC) 20 MG capsule Take 20 mg by mouth daily.   guaiFENesin (MUCINEX PO) Take 20 mLs by mouth 2 (two) times daily as needed (congestion).   hydrochlorothiazide (HYDRODIURIL) 50 MG tablet Take 1 tablet (50 mg total) by mouth daily.   levonorgestrel (MIRENA) 20 MCG/24HR IUD 1 each by Intrauterine route once.   tinidazole (TINDAMAX) 500 MG tablet Take 2,000 mg by mouth once.   traZODone (DESYREL) 50 MG tablet Take 50 mg by mouth at bedtime as needed for sleep.     Allergies:   Lisinopril   Social History   Socioeconomic History   Marital status: Single    Spouse name: Not on file   Number of children: 0   Years of education: 12+   Highest education level: Not on file  Occupational History    Employer: [employer]   Tobacco Use   Smoking status: Former    Packs/day: 0.50    Years: 5.00    Total pack years: 2.50    Types: Cigarettes   Smokeless tobacco: Never   Tobacco comments:    Quit smoking 07/2020  Vaping Use   Vaping Use: Never used  Substance and Sexual Activity   Alcohol use: Yes    Comment: socially   Drug use: No   Sexual activity: Yes    Birth control/protection: None    Comment: last intercourse Jul 31 2014  Other Topics Concern   Not on file  Social History Narrative   Regular exercise-no   Caffeine Use-yes   Social Determinants of Health   Financial Resource Strain: Not on file  Food Insecurity: No Food Insecurity (08/03/2022)   Hunger Vital Sign    Worried About Running Out of Food  in the Last Year: Never true    [place] in the Last Year: Never true  Transportation Needs: No Transportation Needs (08/03/2022)   PRAPARE - Hydrologist (Medical): No    Lack of Transportation (Non-Medical): No  Physical Activity: Not on file  Stress: Not on file  Social Connections: Not on file     Family History: The patient's family history includes Heart attack in her mother; Hypertension in her brother, father, maternal grandfather, maternal grandmother, mother, paternal grandfather, and paternal grandmother.  ROS:   Please see the history of present illness.    All other systems reviewed and are negative.  EKGs/Labs/Other Studies Reviewed:    The following studies were reviewed today:  EKG: Sinus rhythm, short PR interval, LVH with repolarization abnormality diffusely.  Recent Labs: No results found for requested labs within last 365 days.  Recent Lipid Panel    Component Value Date/Time   CHOL 156 08/08/2012 0903   TRIG 198.0 (H) 08/08/2012 0903   HDL 34.90 (L) 08/08/2012 0903   CHOLHDL 4 08/08/2012 0903   VLDL 39.6 08/08/2012 0903   LDLCALC 82 08/08/2012 0903    Physical Exam:    VS:  BP 136/78   Pulse 67   Ht 5\' 5"   (1.651 m)   Wt 270 lb 12.8 oz (122.8 kg)   BMI 45.06 kg/m     Wt Readings from Last 5 Encounters:  09/13/22 270 lb 12.8 oz (122.8 kg)  07/19/22 265 lb (120.2 kg)  06/08/22 266 lb 3.2 oz (120.7 kg)  05/18/22 260 lb (117.9 kg)  04/21/22 260 lb 3.2 oz (118 kg)     Constitutional: No acute distress Eyes: sclera non-icteric, normal conjunctiva and lids ENMT: normal dentition, moist mucous membranes Cardiovascular: regular rhythm, normal rate, no murmurs. S1 and S2 normal. Radial pulses normal bilaterally. No jugular venous distention.  Respiratory: clear to auscultation bilaterally GI : normal bowel sounds, soft and nontender. No distention.   MSK: extremities warm, well perfused. No edema.  NEURO: grossly nonfocal exam, moves all extremities. PSYCH: alert and oriented x 3, normal mood and affect.   ASSESSMENT:    1. Atrial flutter, unspecified type ([place])   2. Postpartum cardiomyopathy   3. Morbid obesity ([place])   4. OSA (obstructive sleep apnea)   5. Nonspecific abnormal electrocardiogram (ECG) (EKG)   6. Chest pain, unspecified type   7. Palpitations     PLAN:    Atrial flutter, unspecified type ([place]) - Plan: EKG 12-Lead -No known recurrence.  She has a smart watch which monitors her heart rate and she will keep an eye on this.  Postpartum cardiomyopathy -Most recent echocardiogram with preserved EF and moderate LVH which is likely the source of her abnormal EKG.  We discussed preconception counseling today and I have informed her of our cardio OB clinic should she plan a pregnancy.  Currently she is not planning to get pregnant but will inform us if she does.  Morbid obesity ([place]) -Establishing with healthy weight and wellness center, strongly encourage.  Discussed Mediterranean diet today and healthy eating strategies with children.  OSA (obstructive sleep apnea) -Encourage compliance with CPAP  Nonspecific abnormal electrocardiogram (ECG) (EKG) -Likely repolarization  change in the setting of LVH  Chest pain, unspecified type Palpitations  -If chest pain continues or occurs with activity, would consider coronary CTA.  She may benefit from a coronary assessment to better understand how to manage her lipids.  Would  not plan to perform a coronary calcium study in isolation she would benefit more from an anatomic assessment.  We discussed radiation doses with stress testing modalities in great detail today and participated in shared decision making to defer ischemic testing at this time.  Palpitations are likely related to deconditioning, if they worsen or if heart rate is elevated or irregular, would consider cardiac monitor at that time.  She is in agreement with this plan.   Total time of encounter: 30 minutes total time of encounter, including 25 minutes spent in face-to-face patient care on the date of this encounter. This time includes coordination of care and counseling regarding above mentioned problem list. Remainder of non-face-to-face time involved reviewing chart documents/testing relevant to the patient encounter and documentation in the medical record. I have independently reviewed documentation from referring provider.   Cherlynn Kaiser, MD, [facility]    Medication Adjustments/Labs and Tests Ordered: Current medicines are reviewed at length with the patient today.  Concerns regarding medicines are outlined above.  Orders Placed This Encounter  Procedures   EKG 12-Lead   No orders of the defined types were placed in this encounter.   Patient Instructions  Medication Instructions:  No Changes In Medications at this time.  *If you need a refill on your cardiac medications before your next appointment, please call your pharmacy*  Follow-Up: At [facility], you and your health needs are our priority.  As part of our continuing mission to provide you with exceptional heart care, we have created designated  Provider Care Teams.  These Care Teams include your primary Cardiologist (physician) and Advanced Practice Providers (APPs -  Physician Assistants and Nurse Practitioners) who all work together to provide you with the care you need, when you need it.  Your next appointment:   6 month(s)  Provider:   Elouise Munroe, MD

## 2022-09-16 DIAGNOSIS — F900 Attention-deficit hyperactivity disorder, predominantly inattentive type: Secondary | ICD-10-CM | POA: Diagnosis not present

## 2022-09-16 DIAGNOSIS — F341 Dysthymic disorder: Secondary | ICD-10-CM | POA: Diagnosis not present

## 2022-09-16 DIAGNOSIS — F1010 Alcohol abuse, uncomplicated: Secondary | ICD-10-CM | POA: Diagnosis not present

## 2022-09-20 DIAGNOSIS — G4733 Obstructive sleep apnea (adult) (pediatric): Secondary | ICD-10-CM | POA: Diagnosis not present

## 2022-09-30 ENCOUNTER — Ambulatory Visit (INDEPENDENT_AMBULATORY_CARE_PROVIDER_SITE_OTHER): Payer: BC Managed Care – PPO | Admitting: Internal Medicine

## 2022-09-30 ENCOUNTER — Encounter (INDEPENDENT_AMBULATORY_CARE_PROVIDER_SITE_OTHER): Payer: Self-pay | Admitting: Internal Medicine

## 2022-09-30 VITALS — BP 130/84 | HR 72 | Temp 97.8°F | Ht 66.0 in | Wt 266.0 lb

## 2022-09-30 DIAGNOSIS — I110 Hypertensive heart disease with heart failure: Secondary | ICD-10-CM

## 2022-09-30 DIAGNOSIS — I5022 Chronic systolic (congestive) heart failure: Secondary | ICD-10-CM

## 2022-09-30 DIAGNOSIS — E559 Vitamin D deficiency, unspecified: Secondary | ICD-10-CM | POA: Diagnosis not present

## 2022-09-30 DIAGNOSIS — Z0289 Encounter for other administrative examinations: Secondary | ICD-10-CM

## 2022-09-30 DIAGNOSIS — E781 Pure hyperglyceridemia: Secondary | ICD-10-CM

## 2022-09-30 DIAGNOSIS — F1010 Alcohol abuse, uncomplicated: Secondary | ICD-10-CM | POA: Diagnosis not present

## 2022-09-30 DIAGNOSIS — Z87891 Personal history of nicotine dependence: Secondary | ICD-10-CM

## 2022-09-30 DIAGNOSIS — Z6841 Body Mass Index (BMI) 40.0 and over, adult: Secondary | ICD-10-CM

## 2022-09-30 DIAGNOSIS — F900 Attention-deficit hyperactivity disorder, predominantly inattentive type: Secondary | ICD-10-CM | POA: Diagnosis not present

## 2022-09-30 DIAGNOSIS — I10 Essential (primary) hypertension: Secondary | ICD-10-CM

## 2022-09-30 DIAGNOSIS — F341 Dysthymic disorder: Secondary | ICD-10-CM | POA: Diagnosis not present

## 2022-09-30 NOTE — Assessment & Plan Note (Signed)
I reviewed most recent echo which shows improved ejection fraction.  She has moderate concentric LVH likely due to obesity and also hypertension.  She will continue on current regimen for goal blood pressure of less than 120/80.  Losing 10 to 15% of body weight may improve condition.

## 2022-09-30 NOTE — Assessment & Plan Note (Signed)
We reviewed weight, biometrics, associated medical conditions and contributing factors with patient. She would benefit from weight loss therapy via a modified calorie, low-carb, high-protein nutritional plan tailored to their REE (resting energy expenditure) which will be determined by indirect calorimetry.  We will also assess for cardiometabolic risk and nutritional derangements via fasting serologies at her next appointment.

## 2022-09-30 NOTE — Progress Notes (Signed)
Office: [phone]  /  Fax: [phone]   Initial Visit  Tracy Grant was seen in clinic today to evaluate for obesity. She is interested in losing weight to improve overall health and reduce the risk of weight related complications. She presents today to review program treatment options, initial physical assessment, and evaluation.   Work in [employer], one child.   She was referred by: Specialist Cardiologist  When asked what else they would like to accomplish? She states: Adopt healthier eating patterns, Improve energy levels and physical activity, Improve existing medical conditions, Reduce number of medications, and Improve quality of life  When asked how has your weight affected you? She states: Contributed to medical problems, Contributed to orthopedic problems or mobility issues, Having fatigue, Having poor endurance, and Problems with eating patterns  Some associated conditions: Hypertension, Hyperlipidemia, Heart disease, and Vitamin D Deficiency  Contributing factors: Family history, Medications, Stress, Reduced physical activity, and Eating patterns  Weight promoting medications identified: Beta-blockers  Current nutrition plan: None  Current level of physical activity: None  Current or previous pharmacotherapy: None  Response to medication: Never tried medications   Past medical history includes:   Past Medical History:  Diagnosis Date   Allergy    Bipolar disorder ([place]) 2014   Cardiomyopathy ([place])    a. Echo (10/15):  EF 35-40%, mild MR, mild to moderately reduced RV systolic function, PASP 31 mmHg  ; b.  Echo (12/15):  Posterior basal HK, mild LVH, EF 55%, mild LAE.   Headache(784.0)    History of chicken pox    Hx of migraines    Hyperlipidemia    Hypertension    Infection    trich     Objective:   BP 130/84   Pulse 72   Temp 97.8 F (36.6 C)   Ht 5' 6"$  (1.676 m)   Wt 266 lb (120.7 kg)   SpO2 100%   BMI 42.93 kg/m  She was  weighed on the bioimpedance scale: Body mass index is 42.93 kg/m.  Peak Weight:275 , Body Fat%:47, Visceral Fat Rating:13, Weight trend over the last 12 months: Unchanged  General:  Alert, oriented and cooperative. Patient is in no acute distress.  Respiratory: Normal respiratory effort, no problems with respiration noted  Extremities: Normal range of motion.    Mental Status: Normal mood and affect. Normal behavior. Normal judgment and thought content.   DIAGNOSTIC DATA REVIEWED:  BMET    Component Value Date/Time   NA 136 05/03/2021 0357   K 4.0 05/03/2021 0357   CL 101 05/03/2021 0357   CO2 25 05/03/2021 0357   GLUCOSE 99 05/03/2021 0357   BUN 10 05/03/2021 0357   CREATININE 1.01 (H) 05/03/2021 0357   CREATININE 0.78 08/27/2015 1221   CALCIUM 9.6 05/03/2021 0357   GFRNONAA >60 05/03/2021 0357   GFRNONAA >89 08/27/2015 1221   GFRAA >60 03/24/2017 0258   GFRAA >89 08/27/2015 1221   Lab Results  Component Value Date   HGBA1C 5.1 08/08/2012   No results found for: "INSULIN" CBC    Component Value Date/Time   WBC 4.7 05/03/2021 0357   RBC 4.21 05/03/2021 0357   HGB 14.2 05/03/2021 0357   HCT 40.6 05/03/2021 0357   PLT 188 05/03/2021 0357   MCV 96.4 05/03/2021 0357   MCH 33.7 05/03/2021 0357   MCHC 35.0 05/03/2021 0357   RDW 11.9 05/03/2021 0357   Iron/TIBC/Ferritin/ %Sat No results found for: "IRON", "TIBC", "FERRITIN", "IRONPCTSAT" Lipid Panel  Component Value Date/Time   CHOL 156 08/08/2012 0903   TRIG 198.0 (H) 08/08/2012 0903   HDL 34.90 (L) 08/08/2012 0903   CHOLHDL 4 08/08/2012 0903   VLDL 39.6 08/08/2012 0903   LDLCALC 82 08/08/2012 0903   Hepatic Function Panel     Component Value Date/Time   PROT 7.8 05/02/2021 0505   ALBUMIN 4.1 05/02/2021 0505   AST 28 05/02/2021 0505   ALT 22 05/02/2021 0505   ALKPHOS 44 05/02/2021 0505   BILITOT 0.8 05/02/2021 0505      Component Value Date/Time   TSH 0.916 05/02/2021 1054   TSH 0.759 08/27/2015 1221      Assessment and Plan:   Benign essential HTN Assessment & Plan: Blood pressure at goal for age and risk category.  On amlodipine 10 mg a day, hydrochlorothiazide 50 mg a day metoprolol 25 mg daily.  Metoprolol may cause weight gain.  Most recent renal parameters reviewed which showed normal electrolytes and kidney function.  Continue current regimen losing 10 to 15% of body weight may improve blood pressure control.    Class 3 severe obesity with serious comorbidity and body mass index (BMI) of 40.0 to 44.9 in adult, unspecified obesity type [facility]) Assessment & Plan: We reviewed weight, biometrics, associated medical conditions and contributing factors with patient. She would benefit from weight loss therapy via a modified calorie, low-carb, high-protein nutritional plan tailored to their REE (resting energy expenditure) which will be determined by indirect calorimetry.  We will also assess for cardiometabolic risk and nutritional derangements via fasting serologies at her next appointment.   Pure hypertriglyceridemia Assessment & Plan: Patient had elevated triglycerides likely secondary to nutritional and excess adiposity.  We will repeat fasting lipid profile with intake labs.  Nutrition counseling to follow   Vitamin D deficiency Assessment & Plan: Most recent vitamin D levels  Lab Results  Component Value Date   VD25OH 22 (L) 06/15/2013     Deficiency state associated with adiposity and may result in leptin resistance, weight gain and fatigue. Currently not on vitamin D supplementation.  Plan: Recheck levels with intake labs and determine need for supplementation.    Heart failure with improved ejection fraction (HFimpEF) (HCC) Assessment & Plan: I reviewed most recent echo which shows improved ejection fraction.  She has moderate concentric LVH likely due to obesity and also hypertension.  She will continue on current regimen for goal blood pressure of less than 120/80.   Losing 10 to 15% of body weight may improve condition.         Obesity Treatment / Action Plan:  Patient will work on garnering support from family and friends to begin weight loss journey. Will work on eliminating or reducing the presence of highly palatable, calorie dense foods in the home. Will complete provided nutritional and psychosocial assessment questionnaire before the next appointment. Will be scheduled for indirect calorimetry to determine resting energy expenditure in a fasting state.  This will allow [place] to create a reduced calorie, high-protein meal plan to promote loss of fat mass while preserving muscle mass. Counseled on the health benefits of losing 5%-15% of total body weight. Was counseled on nutritional approaches to weight loss and benefits of complex carbs and high quality protein as part of nutritional weight management. Was counseled on pharmacotherapy and role as an adjunct in weight management.   Obesity Education Performed Today:  She was weighed on the bioimpedance scale and results were discussed and documented in the synopsis.  We  discussed obesity as a disease and the importance of a more detailed evaluation of all the factors contributing to the disease.  We discussed the importance of long term lifestyle changes which include nutrition, exercise and behavioral modifications as well as the importance of customizing this to her specific health and social needs.  We discussed the benefits of reaching a healthier weight to alleviate the symptoms of existing conditions and reduce the risks of the biomechanical, metabolic and psychological effects of obesity.  Martina Sinner appears to be in the action stage of change and states they are ready to start intensive lifestyle modifications and behavioral modifications.  30 minutes was spent today on this visit including the above counseling, pre-visit chart review, and post-visit documentation.  Reviewed by  clinician on day of visit: allergies, medications, problem list, medical history, surgical history, family history, social history, and previous encounter notes.    I have reviewed the above documentation for accuracy and completeness, and I agree with the above.  Thomes Dinning, MD

## 2022-09-30 NOTE — Assessment & Plan Note (Signed)
Most recent vitamin D levels  Lab Results  Component Value Date   VD25OH 22 (L) 06/15/2013     Deficiency state associated with adiposity and may result in leptin resistance, weight gain and fatigue. Currently not on vitamin D supplementation.  Plan: Recheck levels with intake labs and determine need for supplementation.

## 2022-09-30 NOTE — Assessment & Plan Note (Signed)
Patient had elevated triglycerides likely secondary to nutritional and excess adiposity.  We will repeat fasting lipid profile with intake labs.  Nutrition counseling to follow

## 2022-09-30 NOTE — Assessment & Plan Note (Signed)
Blood pressure at goal for age and risk category.  On amlodipine 10 mg a day, hydrochlorothiazide 50 mg a day metoprolol 25 mg daily.  Metoprolol may cause weight gain.  Most recent renal parameters reviewed which showed normal electrolytes and kidney function.  Continue current regimen losing 10 to 15% of body weight may improve blood pressure control.

## 2022-10-03 DIAGNOSIS — G4733 Obstructive sleep apnea (adult) (pediatric): Secondary | ICD-10-CM | POA: Diagnosis not present

## 2022-10-14 DIAGNOSIS — F900 Attention-deficit hyperactivity disorder, predominantly inattentive type: Secondary | ICD-10-CM | POA: Diagnosis not present

## 2022-10-14 DIAGNOSIS — F341 Dysthymic disorder: Secondary | ICD-10-CM | POA: Diagnosis not present

## 2022-10-14 DIAGNOSIS — F1010 Alcohol abuse, uncomplicated: Secondary | ICD-10-CM | POA: Diagnosis not present

## 2022-10-19 DIAGNOSIS — G4733 Obstructive sleep apnea (adult) (pediatric): Secondary | ICD-10-CM | POA: Diagnosis not present

## 2022-10-27 ENCOUNTER — Encounter (INDEPENDENT_AMBULATORY_CARE_PROVIDER_SITE_OTHER): Payer: Self-pay | Admitting: Internal Medicine

## 2022-10-27 ENCOUNTER — Ambulatory Visit (INDEPENDENT_AMBULATORY_CARE_PROVIDER_SITE_OTHER): Payer: BC Managed Care – PPO | Admitting: Internal Medicine

## 2022-10-27 VITALS — BP 143/98 | HR 71 | Temp 97.2°F | Ht 66.0 in | Wt 264.0 lb

## 2022-10-27 DIAGNOSIS — Z6841 Body Mass Index (BMI) 40.0 and over, adult: Secondary | ICD-10-CM

## 2022-10-27 DIAGNOSIS — R5383 Other fatigue: Secondary | ICD-10-CM | POA: Insufficient documentation

## 2022-10-27 DIAGNOSIS — E781 Pure hyperglyceridemia: Secondary | ICD-10-CM

## 2022-10-27 DIAGNOSIS — E559 Vitamin D deficiency, unspecified: Secondary | ICD-10-CM

## 2022-10-27 DIAGNOSIS — Z1331 Encounter for screening for depression: Secondary | ICD-10-CM

## 2022-10-27 DIAGNOSIS — I110 Hypertensive heart disease with heart failure: Secondary | ICD-10-CM

## 2022-10-27 DIAGNOSIS — I5022 Chronic systolic (congestive) heart failure: Secondary | ICD-10-CM | POA: Diagnosis not present

## 2022-10-27 DIAGNOSIS — R0602 Shortness of breath: Secondary | ICD-10-CM | POA: Insufficient documentation

## 2022-10-27 DIAGNOSIS — G4733 Obstructive sleep apnea (adult) (pediatric): Secondary | ICD-10-CM

## 2022-10-27 DIAGNOSIS — I10 Essential (primary) hypertension: Secondary | ICD-10-CM

## 2022-10-27 NOTE — Assessment & Plan Note (Signed)
Patient enjoys highly palatable foods and skips breakfast.  She also eats out several times a week.  She may find implementation of reduced calorie nutrition plan somewhat challenging.  We discussed about working on making small changes.  She may also be a candidate for pharmacotherapy to help reduce hunger signals and appetite and allow her to adhere to a reduced calorie nutrition plan.

## 2022-10-27 NOTE — Assessment & Plan Note (Signed)
Per history.  She is currently not on supplementation.  We will check vitamin D levels as deficiency state is associated with excess adiposity and may cause leptin resistance.  We will supplement accordingly.

## 2022-10-27 NOTE — Assessment & Plan Note (Signed)
She is currently on CPAP therapy.  She notes that sometimes she wakes up without the mask so she is not she is not sure how many hours of treatment she is getting.  She does have an elevated [place] so I suspect treatment may be suboptimal.  Losing 15% of body weight will help reduce AHI.  She may need to work with vendor to help adjust settings and mask.

## 2022-10-27 NOTE — Progress Notes (Unsigned)
Chief Complaint:   OBESITY Tracy Grant (MR# [id number]) is a 37 y.o. female who presents for evaluation and treatment of obesity and related comorbidities. Current BMI is Body mass index is 42.61 kg/m. Tracy Grant has been struggling with her weight for many years and has been unsuccessful in either losing weight, maintaining weight loss, or reaching her healthy weight goal.  Tracy Grant is currently in the action stage of change and ready to dedicate time achieving and maintaining a healthier weight. Tracy Grant is interested in becoming our patient and working on intensive lifestyle modifications including (but not limited to) diet and exercise for weight loss.  Tracy Grant's habits were reviewed today and are as follows: Her family eats meals together, she thinks her family will eat healthier with her, her desired weight loss is 94 lbs, she has been heavy most of her life, she started gaining weight last year, her heaviest weight ever was 275 pounds, she has significant food cravings issues, she snacks frequently in the evenings, she skips meals frequently, she is frequently drinking liquids with calories, she frequently makes poor food choices, she has problems with excessive hunger, she frequently eats larger portions than normal, and she struggles with emotional eating.  Depression Screen Tracy Grant's Food and Mood (modified PHQ-9) score was 14.  Subjective:   1. Other fatigue Tracy Grant admits to daytime somnolence and admits to waking up still tired. Patient has a history of symptoms of daytime fatigue, morning fatigue, and morning headache. Tracy Grant generally gets 5 or 6 hours of sleep per night, and states that she has nightime awakenings. Snoring is present. Apneic episodes are present. Epworth Sleepiness Score is 13.   2. SOB (shortness of breath) on exertion Tracy Grant notes increasing shortness of breath with exercising and seems to be worsening over time with weight gain. She notes  getting out of breath sooner with activity than she used to. This has not gotten worse recently. Tracy Grant denies shortness of breath at rest or orthopnea.  3. Benign essential HTN Her blood pressure is uncontrolled.  She has a history of heart failure with improved ejection fraction.  She has not taken blood pressure medications this morning but this could also be a sign of nondipping or medications not lasting 24 hours.  She is currently on hydrochlorothiazide, amlodipine and metoprolol.    4. Pure hypertriglyceridemia She has elevated triglycerides.  This could be nutritional, associated with insulin resistance or related to hydrochlorothiazide.  Lab Results  Component Value Date   CHOL 156 08/08/2012   HDL 34.90 (L) 08/08/2012   LDLCALC 82 08/08/2012   TRIG 198.0 (H) 08/08/2012   CHOLHDL 4 08/08/2012   5. Vitamin D deficiency Per history.  She is currently not on supplementation.  6. Heart failure with improved ejection fraction (HFimpEF) (Tracy Grant) Patient is euvolemic.  But she also appears to have a diet high in sodium and appears to be salt sensitive.  I reviewed most recent echo which shows improved ejection fraction.  She has moderate concentric LVH likely due to obesity and also hypertension.    7. OSA on CPAP She is currently on CPAP therapy.  She notes that sometimes she wakes up without the mask so she is not she is not sure how many hours of treatment she is getting.  She does have an elevated Epworth so I suspect treatment may be suboptimal.    Assessment/Plan:   1. Other fatigue [name] does feel that her weight is causing her energy to be  lower than it should be. Fatigue may be related to obesity, depression or many other causes. Labs will be ordered, and in the meanwhile, Tracy Grant will focus on self care including making healthy food choices, increasing physical activity and focusing on stress reduction.  - Vitamin B12  2. SOB (shortness of breath) on exertion Tracy Grant  does feel that she gets out of breath more easily that she used to when she exercises. Tracy Grant's shortness of breath appears to be obesity related and exercise induced. She has agreed to work on weight loss and gradually increase exercise to treat her exercise induced shortness of breath. Will continue to monitor closely.  - CBC with Differential/Platelet  3. Benign essential HTN Metoprolol may cause weight gain.  I recommend that she check her blood pressure in the morning and also before bedtime if her blood pressures are above goal of less than 120/80 she will contact her primary care team for medication adjustments.  We are checking renal parameters with labs today.  Losing 10% of body weight may improve blood pressure control.  She was counseled on the risk of uncontrolled high blood pressure.  4. Pure hypertriglyceridemia We will check fasting lipid profile.  Reducing simple and added sugars as well as fatty foods via nutritional strategies will improve condition.  - Hemoglobin A1c - Insulin, random - TSH - Lipid Panel With LDL/HDL Ratio  5. Vitamin D deficiency We will check vitamin D levels as deficiency state is associated with excess adiposity and may cause leptin resistance.  We will supplement accordingly.  6. Heart failure with improved ejection fraction (HFimpEF) (Tracy Grant) She will continue on current regimen.  Her blood pressure is currently not at goal.  Please refer to hypertension.  Patient also counseled on reducing consumption of added salt in her diet.  She will also work on reading food labels.  - Comprehensive metabolic panel  7. OSA on CPAP Losing 15% of body weight will help reduce AHI.  She may need to work with vendor to help adjust settings and mask.  8. Depression screening Tracy Grant had a positive depression screening. Depression is commonly associated with obesity and often results in emotional eating behaviors. We will monitor this closely and work on CBT to  help improve the non-hunger eating patterns. Referral to Psychology may be required if no improvement is seen as she continues in our clinic.  9. Class 3 severe obesity with serious comorbidity and body mass index (BMI) of 40.0 to 44.9 in adult, unspecified obesity type Tracy Grant) Patient enjoys highly palatable foods and skips breakfast.  She also eats out several times a week.  She may find implementation of reduced calorie nutrition plan somewhat challenging.  We discussed about working on making small changes.  She may also be a candidate for pharmacotherapy to help reduce hunger signals and appetite and allow her to adhere to a reduced calorie nutrition plan.  - VITAMIN D 25 Hydroxy (Vit-D Deficiency, Fractures)  Eveny is currently in the action stage of change and her goal is to continue with weight loss efforts. I recommend Belvie begin the structured treatment plan as follows:  She has agreed to the Category 3 Plan.  Exercise goals: All adults should avoid inactivity. Some physical activity is better than none, and adults who participate in any amount of physical activity gain some health benefits.   Behavioral modification strategies: increasing lean protein intake, decreasing simple carbohydrates, increasing vegetables, increasing water intake, decreasing liquid calories, increasing high fiber foods, decreasing eating  out, no skipping meals, meal planning and cooking strategies, keeping healthy foods in the home, better snacking choices, avoiding temptations, and planning for success.  She was informed of the importance of frequent follow-up visits to maximize her success with intensive lifestyle modifications for her multiple health conditions. She was informed we would discuss her lab results at her next visit unless there is a critical issue that needs to be addressed sooner. Iredell agreed to keep her next visit at the agreed upon time to discuss these results.  Objective:   Blood  pressure (!) 143/98, pulse 71, temperature (!) 97.2 F (36.2 C), height 5\' 6"  (1.676 m), weight 264 lb (119.7 kg), SpO2 98 %. Body mass index is 42.61 kg/m.  EKG: Normal sinus rhythm, rate 67 BM.  Indirect Calorimeter completed today shows a VO2 of 332 and a REE of 2290.  Her calculated basal metabolic rate is [id number] thus her basal metabolic rate is better than expected.  General: Cooperative, alert, well developed, in no acute distress. HEENT: Conjunctivae and lids unremarkable. Cardiovascular: Regular rhythm.  Lungs: Normal work of breathing. Neurologic: No focal deficits.   Lab Results  Component Value Date   CREATININE 1.02 (H) 10/27/2022   BUN 7 10/27/2022   NA 135 10/27/2022   K 4.2 10/27/2022   CL 98 10/27/2022   CO2 23 10/27/2022   Lab Results  Component Value Date   ALT 20 10/27/2022   AST 22 10/27/2022   ALKPHOS 59 10/27/2022   BILITOT 0.6 10/27/2022   Lab Results  Component Value Date   HGBA1C 4.9 10/27/2022   HGBA1C 5.1 08/08/2012   Lab Results  Component Value Date   INSULIN 13.8 10/27/2022   Lab Results  Component Value Date   TSH 1.160 10/27/2022   Lab Results  Component Value Date   CHOL 204 (H) 10/27/2022   HDL 45 10/27/2022   LDLCALC 125 (H) 10/27/2022   TRIG 190 (H) 10/27/2022   CHOLHDL 4 08/08/2012   Lab Results  Component Value Date   WBC 3.8 10/27/2022   HGB 13.5 10/27/2022   HCT 40.4 10/27/2022   MCV 100 (H) 10/27/2022   PLT 282 10/27/2022   No results found for: "IRON", "TIBC", "FERRITIN"  Attestation Statements:   Reviewed by clinician on day of visit: allergies, medications, problem list, medical history, surgical history, family history, social history, and previous encounter notes.  Time spent on visit including pre-visit chart review and post-visit charting and care was 40 minutes.   Wilhemena Durie, am acting as transcriptionist for Thomes Dinning, MD.  I have reviewed the above documentation for accuracy and  completeness, and I agree with the above. -Thomes Dinning, MD

## 2022-10-27 NOTE — Assessment & Plan Note (Signed)
Her blood pressure is uncontrolled.  She has a history of heart failure with improved ejection fraction.  She has not taken blood pressure medications this morning but this could also be a sign of nondipping or medications not lasting 24 hours.  She is currently on hydrochlorothiazide, amlodipine and metoprolol.  Metoprolol may cause weight gain.  I recommend that she check her blood pressure in the morning and also before bedtime if her blood pressures are above goal of less than 120/80 she will contact her primary care team for medication adjustments.  We are checking renal parameters with labs today.  Losing 10% of body weight may improve blood pressure control.  She was counseled on the risk of uncontrolled high blood pressure.

## 2022-10-27 NOTE — Assessment & Plan Note (Signed)
Patient is euvolemic.  But she also appears to have a diet high in sodium and appears to be salt sensitive.  I reviewed most recent echo which shows improved ejection fraction.  She has moderate concentric LVH likely due to obesity and also hypertension.  She will continue on current regimen.  Her blood pressure is currently not at goal.  Please refer to hypertension.  Patient also counseled on reducing consumption of added salt in her diet.  She will also work on reading food labels

## 2022-10-27 NOTE — Assessment & Plan Note (Signed)
She has elevated triglycerides.  This could be nutritional, associated with insulin resistance or related to hydrochlorothiazide.  Lab Results  Component Value Date   CHOL 156 08/08/2012   HDL 34.90 (L) 08/08/2012   LDLCALC 82 08/08/2012   TRIG 198.0 (H) 08/08/2012   CHOLHDL 4 08/08/2012    We will check fasting lipid profile.  Reducing simple and added sugars as well as fatty foods via nutritional strategies will improve condition.

## 2022-10-28 LAB — COMPREHENSIVE METABOLIC PANEL
ALT: 20 IU/L (ref 0–32)
AST: 22 IU/L (ref 0–40)
Albumin/Globulin Ratio: 1.7 (ref 1.2–2.2)
Albumin: 4.6 g/dL (ref 3.9–4.9)
Alkaline Phosphatase: 59 IU/L (ref 44–121)
BUN/Creatinine Ratio: 7 — ABNORMAL LOW (ref 9–23)
BUN: 7 mg/dL (ref 6–20)
Bilirubin Total: 0.6 mg/dL (ref 0.0–1.2)
CO2: 23 mmol/L (ref 20–29)
Calcium: 9.8 mg/dL (ref 8.7–10.2)
Chloride: 98 mmol/L (ref 96–106)
Creatinine, Ser: 1.02 mg/dL — ABNORMAL HIGH (ref 0.57–1.00)
Globulin, Total: 2.7 g/dL (ref 1.5–4.5)
Glucose: 101 mg/dL — ABNORMAL HIGH (ref 70–99)
Potassium: 4.2 mmol/L (ref 3.5–5.2)
Sodium: 135 mmol/L (ref 134–144)
Total Protein: 7.3 g/dL (ref 6.0–8.5)
eGFR: 74 mL/min/{1.73_m2} (ref >59)

## 2022-10-28 LAB — LIPID PANEL WITH LDL/HDL RATIO
Cholesterol, Total: 204 mg/dL — ABNORMAL HIGH (ref 100–199)
HDL: 45 mg/dL (ref >39)
LDL Chol Calc (NIH): 125 mg/dL — ABNORMAL HIGH (ref 0–99)
LDL/HDL Ratio: 2.8 ratio (ref 0.0–3.2)
Triglycerides: 190 mg/dL — ABNORMAL HIGH (ref 0–149)
VLDL Cholesterol Cal: 34 mg/dL (ref 5–40)

## 2022-10-28 LAB — INSULIN, RANDOM: INSULIN: 13.8 u[IU]/mL (ref 2.6–24.9)

## 2022-10-28 LAB — CBC WITH DIFFERENTIAL/PLATELET
Basophils Absolute: 0 10*3/uL (ref 0.0–0.2)
Basos: 1 %
EOS (ABSOLUTE): 0.1 10*3/uL (ref 0.0–0.4)
Eos: 2 %
Hematocrit: 40.4 % (ref 34.0–46.6)
Hemoglobin: 13.5 g/dL (ref 11.1–15.9)
Immature Grans (Abs): 0 10*3/uL (ref 0.0–0.1)
Immature Granulocytes: 0 %
Lymphocytes Absolute: 1.5 10*3/uL (ref 0.7–3.1)
Lymphs: 41 %
MCH: 33.3 pg — ABNORMAL HIGH (ref 26.6–33.0)
MCHC: 33.4 g/dL (ref 31.5–35.7)
MCV: 100 fL — ABNORMAL HIGH (ref 79–97)
Monocytes Absolute: 0.3 10*3/uL (ref 0.1–0.9)
Monocytes: 9 %
Neutrophils Absolute: 1.8 10*3/uL (ref 1.4–7.0)
Neutrophils: 47 %
Platelets: 282 10*3/uL (ref 150–450)
RBC: 4.06 x10E6/uL (ref 3.77–5.28)
RDW: 13.1 % (ref 11.7–15.4)
WBC: 3.8 10*3/uL (ref 3.4–10.8)

## 2022-10-28 LAB — HEMOGLOBIN A1C
Est. average glucose Bld gHb Est-mCnc: 94 mg/dL
Hgb A1c MFr Bld: 4.9 % (ref 4.8–5.6)

## 2022-10-28 LAB — TSH: TSH: 1.16 u[IU]/mL (ref 0.450–4.500)

## 2022-10-28 LAB — VITAMIN D 25 HYDROXY (VIT D DEFICIENCY, FRACTURES): Vit D, 25-Hydroxy: 13.1 ng/mL — ABNORMAL LOW (ref 30.0–100.0)

## 2022-10-28 LAB — VITAMIN B12: Vitamin B-12: 348 pg/mL (ref 232–1245)

## 2022-11-01 DIAGNOSIS — G4733 Obstructive sleep apnea (adult) (pediatric): Secondary | ICD-10-CM | POA: Diagnosis not present

## 2022-11-10 ENCOUNTER — Ambulatory Visit (INDEPENDENT_AMBULATORY_CARE_PROVIDER_SITE_OTHER): Payer: BC Managed Care – PPO | Admitting: Internal Medicine

## 2022-11-10 ENCOUNTER — Encounter (INDEPENDENT_AMBULATORY_CARE_PROVIDER_SITE_OTHER): Payer: Self-pay | Admitting: Internal Medicine

## 2022-11-10 VITALS — BP 120/74 | HR 71 | Temp 98.4°F | Ht 66.0 in | Wt 264.0 lb

## 2022-11-10 DIAGNOSIS — D7589 Other specified diseases of blood and blood-forming organs: Secondary | ICD-10-CM | POA: Diagnosis not present

## 2022-11-10 DIAGNOSIS — E88819 Insulin resistance, unspecified: Secondary | ICD-10-CM | POA: Insufficient documentation

## 2022-11-10 DIAGNOSIS — R7989 Other specified abnormal findings of blood chemistry: Secondary | ICD-10-CM

## 2022-11-10 DIAGNOSIS — Z9189 Other specified personal risk factors, not elsewhere classified: Secondary | ICD-10-CM

## 2022-11-10 DIAGNOSIS — E559 Vitamin D deficiency, unspecified: Secondary | ICD-10-CM

## 2022-11-10 DIAGNOSIS — Z6841 Body Mass Index (BMI) 40.0 and over, adult: Secondary | ICD-10-CM

## 2022-11-10 DIAGNOSIS — F1090 Alcohol use, unspecified, uncomplicated: Secondary | ICD-10-CM | POA: Diagnosis not present

## 2022-11-10 DIAGNOSIS — Z789 Other specified health status: Secondary | ICD-10-CM

## 2022-11-10 DIAGNOSIS — E782 Mixed hyperlipidemia: Secondary | ICD-10-CM

## 2022-11-10 MED ORDER — VITAMIN D (ERGOCALCIFEROL) 1.25 MG (50000 UNIT) PO CAPS: 50000.0000 [IU] | ORAL_CAPSULE | ORAL | 0 refills | Status: DC

## 2022-11-10 MED ORDER — TOPIRAMATE 25 MG PO TABS: 25.0000 mg | ORAL_TABLET | Freq: Every day | ORAL | 0 refills | Status: DC

## 2022-11-10 NOTE — Assessment & Plan Note (Addendum)
Likely secondary to alcohol consumption.  We will check methylmalonic acid, folic acid and thiamine levels at the next office visit as lab is closed today.

## 2022-11-10 NOTE — Assessment & Plan Note (Signed)
Most recent vitamin D levels  Lab Results  Component Value Date   VD25OH 13.1 (L) 10/27/2022   VD25OH 22 (L) 06/15/2013     Deficiency state associated with adiposity and may result in leptin resistance, weight gain and fatigue.   Plan: After discussion of benefits, alternative treatment options and side effects patient will be started on vitamin D2 50,000 units 1 tablet weekly for 3-4 months. for a treatment goal level of 50-60 mg/dl. Check levels at that time for response monitoring.

## 2022-11-10 NOTE — Progress Notes (Unsigned)
Office: [phone]  /  Fax: [phone]  WEIGHT SUMMARY AND BIOMETRICS  Vitals Temp: 98.4 F (36.9 C) BP: 120/74 Pulse Rate: 71 SpO2: 100 %   Anthropometric Measurements Height: 5\' 6"  (1.676 m) Weight: 264 lb (119.7 kg) BMI (Calculated): 42.63 Weight at Last Visit: 264 lb Weight Lost Since Last Visit: 0 lb Starting Weight: 264 lb Peak Weight: 275 lb   Body Composition  Body Fat %: 45 % Fat Mass (lbs): 118.8 lbs Muscle Mass (lbs): 137.8 lbs Total Body Water (lbs): 93 lbs Visceral Fat Rating : 12    HPI  Chief Complaint: OBESITY  Tracy Grant is here to discuss her progress with her obesity treatment plan. She is on the the Category 3 Plan and states she is following her eating plan approximately 0 % of the time. She states she is not.exercising.  Interval History:  Since last office visit she has remained weight neutral.  It seems like she had some problems understanding the principles behind reduced calorie nutrition plan.  She reports wanting to do a keto diet but in reality in the past she was not doing an ultra low-carb diet she was just avoiding carbs.  She acknowledge drinking alcohol often.  Her grandmother might of had problems with alcohol.  She has poor sleep and complains of fatigue.  She has also difficulty focusing on healthy eating.  She is tempted by highly palatable foods.  She is not averse to plant-based nutrition.  She has reduced eating out and has been trying to drink more water.  She is also reduce use of butter and has been taking her blood pressure medications regularly. [] Denies [x] Reports problems with appetite and hunger signals.  [] Denies [x] Reports problems with satiety and satiation.  [] Denies [x] Reports problems with eating patterns and portion control.  [] Denies [x] Reports abnormal cravings   Barriers identified having difficulty preparing healthy meals, having difficulty with meal prep and planning, inability to focus on healthy eating,  predilection for convenience, strong hunger signals and food noise , and frequent alcohol consumption .   Pharmacotherapy for weight loss: She is currently taking no anti-obesity medication.    ASSESSMENT AND PLAN  TREATMENT PLAN FOR OBESITY:  Recommended Dietary Goals  Tracy Grant is currently in the action stage of change. As such, her goal is to continue weight management plan. She has agreed to: follow the Category 3 plan and continue current plan  Behavioral Intervention  We discussed the following Behavioral Modification Strategies today: increasing lean protein intake, decreasing simple carbohydrates , increasing vegetables, increasing lower glycemic fruits, increasing fiber rich foods, increasing water intake, work on meal planning and easy cooking plans, reading food labels , identifying sources and decreasing liquid calories, decreasing eating out or consumption of processed foods, and making healthy choices when eating convenient foods, planning for success, better snacking choices, decreasing ETOH, and keeping healthy foods at home.  Additional resources provided today: None  Recommended Physical Activity Goals  Tracy Grant has been advised to work up to 150 minutes of moderate intensity aerobic activity a week and strengthening exercises 2-3 times per week for cardiovascular health, weight loss maintenance and preservation of muscle mass.   She has agreed to :  Think about ways to increase physical activity  Pharmacotherapy We discussed various medication options to help Tracy Grant with her weight loss efforts and we both agreed to : start anti-obesity medication. In addition to reduced calorie nutrition plan (RCNP), behavioral strategies and physical activity, Tracy Grant would benefit from pharmacotherapy to assist with  hunger signals, satiety and cravings. This will reduce obesity-related health risks by inducing weight loss, and help reduce food consumption and adherence to Tracy Grant) .  It may also improve QOL by improving self-confidence and reduce the  setbacks associated with metabolic adaptations.  After discussion of treatment options, mechanisms of action, benefits, side effects, contraindications and shared decision making she is agreeable to starting topiramate 25 mg once a day.  She has an IUD and is not sexually active.  She was made aware that medication may decrease effectiveness of IUD particularly at higher dosages.  She had been on this medication in the past.  Patient also made aware that medication is indicated for long-term management of obesity and the risk of weight regain following discontinuation of treatment and hence the importance of adhering to medical weight loss plan.   ASSOCIATED CONDITIONS ADDRESSED TODAY  Vitamin D deficiency Assessment & Plan: Most recent vitamin D levels  Lab Results  Component Value Date   VD25OH 13.1 (L) 10/27/2022   VD25OH 22 (L) 06/15/2013     Deficiency state associated with adiposity and may result in leptin resistance, weight gain and fatigue.   Plan: After discussion of benefits, alternative treatment options and side effects patient will be started on vitamin D2 50,000 units 1 tablet weekly for 3-4 months. for a treatment goal level of 50-60 mg/dl. Check levels at that time for response monitoring.   Orders: -     Vitamin D (Ergocalciferol); Take 1 capsule (50,000 Units total) by mouth every 7 (seven) days.  Dispense: 16 capsule; Refill: 0  Increased MCV Assessment & Plan: Likely secondary to alcohol consumption.  We will check methylmalonic acid, folic acid and thiamine levels at the next office visit as lab is closed today.  Orders: -     Folate RBC -     Methylmalonic acid, serum -     Vitamin B1  Class 3 severe obesity with serious comorbidity and body mass index (BMI) of 40.0 to 44.9 in adult, unspecified obesity type ([facility]) -     Topiramate; Take 1 tablet (25 mg total) by mouth daily.  Dispense: 30  tablet; Refill: 0  Alcohol use Assessment & Plan: This came about interview today, she is drinking alcohol above moderation.  She has an elevated MCV and has a history of cardiomyopathy which may have been related to a tachyarrhythmia or alcohol related.  She is agreeable to reducing the frequency and amount of alcohol.  She was also made aware that this could be a significant source of calories.  This is also likely affecting her sleep and contributing to her fatigue.       Insulin resistance Assessment & Plan: Her HOMA-IR is 3.44 which is elevated. Optimal level < 1.9. This is complex condition associated with genetics, ectopic fat and lifestyle factors. Insulin resistance may result in weight gain, abnormal cravings (particularly for carbs) and fatigue. This may result in additional weight gain and lead to pre-diabetes and diabetes if untreated.   Lab Results  Component Value Date   HGBA1C 4.9 10/27/2022   Lab Results  Component Value Date   INSULIN 13.8 10/27/2022   Lab Results  Component Value Date   GLUCOSE 101 (H) 10/27/2022   GLUCOSE 107 (H) 07/28/2009    We reviewed treatment options which include losing 7 to 10% of body weight, increasing physical activity to a 150 minutes a week of moderate intensity.She may also be a candidate for pharmacoprophylaxis with metformin or incretin  mimetic.     Mixed hyperlipidemia Assessment & Plan: I reviewed most recent labs she has an elevated triglycerides and LDL cholesterol.  This is nutritional but may be also medication induced.  She is on high-dose thiazide diuretic and drinks alcohol regularly.  She is working on reducing simple and added sugars.  She will be working on reducing consumption of alcohol.  Losing 10% of body weight may also improve lipid profile.  No need for lipid-lowering medications at present time   Low BUN Assessment & Plan: I reviewed several BUN results and she has had a low BUN for several years.  This may  be seen with decreased protein intake but may also suggest liver problems.  She did acknowledge today that she drinks alcohol several days out of the week.  I will screen her for alcohol use disorder at the next office visit.      PHYSICAL EXAM:  Blood pressure 120/74, pulse 71, temperature 98.4 F (36.9 C), height 5\' 6"  (1.676 m), weight 264 lb (119.7 kg), SpO2 100 %. Body mass index is 42.61 kg/m.  General: She is overweight, cooperative, alert, well developed, and in no acute distress. PSYCH: Has normal mood, affect and thought process.   HEENT: EOMI, sclerae are anicteric. Lungs: Normal breathing effort, no conversational dyspnea. Extremities: No edema.  Neurologic: No gross sensory or motor deficits. No tremors or fasciculations noted.    DIAGNOSTIC DATA REVIEWED:  BMET    Component Value Date/Time   NA 135 10/27/2022 1000   K 4.2 10/27/2022 1000   CL 98 10/27/2022 1000   CO2 23 10/27/2022 1000   GLUCOSE 101 (H) 10/27/2022 1000   GLUCOSE 99 05/03/2021 0357   BUN 7 10/27/2022 1000   CREATININE 1.02 (H) 10/27/2022 1000   CREATININE 0.78 08/27/2015 1221   CALCIUM 9.8 10/27/2022 1000   GFRNONAA >60 05/03/2021 0357   GFRNONAA >89 08/27/2015 1221   GFRAA >60 03/24/2017 0258   GFRAA >89 08/27/2015 1221   Lab Results  Component Value Date   HGBA1C 4.9 10/27/2022   HGBA1C 5.1 08/08/2012   Lab Results  Component Value Date   INSULIN 13.8 10/27/2022   Lab Results  Component Value Date   TSH 1.160 10/27/2022   CBC    Component Value Date/Time   WBC 3.8 10/27/2022 1000   WBC 4.7 05/03/2021 0357   RBC 4.06 10/27/2022 1000   RBC 4.21 05/03/2021 0357   HGB 13.5 10/27/2022 1000   HCT 40.4 10/27/2022 1000   PLT 282 10/27/2022 1000   MCV 100 (H) 10/27/2022 1000   MCH 33.3 (H) 10/27/2022 1000   MCH 33.7 05/03/2021 0357   MCHC 33.4 10/27/2022 1000   MCHC 35.0 05/03/2021 0357   RDW 13.1 10/27/2022 1000   Iron Studies No results found for: "IRON", "TIBC",  "FERRITIN", "IRONPCTSAT" Lipid Panel     Component Value Date/Time   CHOL 204 (H) 10/27/2022 1000   TRIG 190 (H) 10/27/2022 1000   HDL 45 10/27/2022 1000   CHOLHDL 4 08/08/2012 0903   VLDL 39.6 08/08/2012 0903   LDLCALC 125 (H) 10/27/2022 1000   Hepatic Function Panel     Component Value Date/Time   PROT 7.3 10/27/2022 1000   ALBUMIN 4.6 10/27/2022 1000   AST 22 10/27/2022 1000   ALT 20 10/27/2022 1000   ALKPHOS 59 10/27/2022 1000   BILITOT 0.6 10/27/2022 1000      Component Value Date/Time   TSH 1.160 10/27/2022 1000   Nutritional Lab  Results  Component Value Date   VD25OH 13.1 (L) 10/27/2022   VD25OH 22 (L) 06/15/2013     Return in about 2 weeks (around 11/24/2022) for For Weight Mangement with Dr. Gerarda Fraction.Marland Kitchen She was informed of the importance of frequent follow up visits to maximize her success with intensive lifestyle modifications for her multiple health conditions.   ATTESTASTION STATEMENTS:  Reviewed by clinician on day of visit: allergies, medications, problem list, medical history, surgical history, family history, social history, and previous encounter notes.     Thomes Dinning, MD

## 2022-11-10 NOTE — Assessment & Plan Note (Signed)
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 Optimal level < 1.9. This is complex condition associated with genetics, ectopic fat and lifestyle factors. Insulin resistance may result in weight gain, abnormal cravings (particularly for carbs) and fatigue. This may result in additional weight gain and lead to pre-diabetes and diabetes if untreated.   Lab Results  Component Value Date   HGBA1C 4.9 10/27/2022   Lab Results  Component Value Date   INSULIN 13.8 10/27/2022   Lab Results  Component Value Date   GLUCOSE 101 (H) 10/27/2022   GLUCOSE 107 (H) 07/28/2009    We reviewed treatment options which include losing 7 to 10% of body weight, increasing physical activity to a 150 minutes a week of moderate intensity.She may also be a candidate for pharmacoprophylaxis with metformin or incretin mimetic.

## 2022-11-10 NOTE — Assessment & Plan Note (Signed)
I reviewed most recent labs she has an elevated triglycerides and LDL cholesterol.  This is nutritional but may be also medication induced.  She is on high-dose thiazide diuretic and drinks alcohol regularly.  She is working on reducing simple and added sugars.  She will be working on reducing consumption of alcohol.  Losing 10% of body weight may also improve lipid profile.  No need for lipid-lowering medications at present time

## 2022-11-10 NOTE — Assessment & Plan Note (Signed)
This came about interview today, she is drinking alcohol above moderation.  She has an elevated MCV and has a history of cardiomyopathy which may have been related to a tachyarrhythmia or alcohol related.  She is agreeable to reducing the frequency and amount of alcohol.  She was also made aware that this could be a significant source of calories.  This is also likely affecting her sleep and contributing to her fatigue.

## 2022-11-11 DIAGNOSIS — R7989 Other specified abnormal findings of blood chemistry: Secondary | ICD-10-CM | POA: Insufficient documentation

## 2022-11-11 DIAGNOSIS — Z9189 Other specified personal risk factors, not elsewhere classified: Secondary | ICD-10-CM | POA: Insufficient documentation

## 2022-11-11 NOTE — Assessment & Plan Note (Addendum)
I reviewed several BUN results and she has had a low BUN for several years.  This may be seen with decreased protein intake but may also suggest liver problems.  She did acknowledge today that she drinks alcohol several days out of the week.  I will screen her for alcohol use disorder at the next office visit.

## 2022-11-11 NOTE — Assessment & Plan Note (Addendum)
Patient counseled for 7 minutes on alcohol use, risks associated with alcohol use and current health effects.  She will be working on reducing the frequency and amount.  We will further assess for alcohol use disorder at the next office visit.

## 2022-11-19 DIAGNOSIS — G4733 Obstructive sleep apnea (adult) (pediatric): Secondary | ICD-10-CM | POA: Diagnosis not present

## 2022-11-24 ENCOUNTER — Ambulatory Visit: Payer: BC Managed Care – PPO | Attending: Cardiology | Admitting: Cardiology

## 2022-11-24 ENCOUNTER — Encounter: Payer: Self-pay | Admitting: Cardiology

## 2022-11-24 ENCOUNTER — Telehealth: Payer: Self-pay | Admitting: *Deleted

## 2022-11-24 VITALS — BP 118/82 | HR 62 | Ht 66.0 in | Wt 265.0 lb

## 2022-11-24 DIAGNOSIS — R400 Somnolence: Secondary | ICD-10-CM

## 2022-11-24 DIAGNOSIS — G4733 Obstructive sleep apnea (adult) (pediatric): Secondary | ICD-10-CM

## 2022-11-24 NOTE — Progress Notes (Signed)
Sleep Medicine CONSULT Note    Date:  11/24/2022   ID:  Tracy Grant, DOB 06-Apr-1986, MRN [id number]  PCP:  Collene Mares, PA  Cardiologist: Parke Poisson, MD   Chief Complaint  Patient presents with   New Patient (Initial Visit)    Obstructive sleep apnea    History of Present Illness:  Tracy Grant is a 37 y.o. female who is being seen today for the evaluation of obstructive sleep apnea at the request of Weston Brass, MD.  This is a 37 year old African-American female with a history of dilated cardiomyopathy, depression, hypertension, hyperlipidemia.  She was seen by Edd Fabian, NP on 04/21/2022 and complained of excessive daytime sleepiness waking up feeling tired and snoring.  Her STOP-BANG score was 5 Epworth sleepiness score 13.  She underwent home sleep study which revealed mild obstructive sleep apnea with an AHI of 11.2/h and severe sleep apnea during REM sleep with a REM AHI 38.8/h.  She underwent CPAP titration to 12 cm H2O.  She is now referred for sleep medicine consult to establish sleep care and treatment.  She is really struggling with her device.  She has a hard time keeping her mask on at night.  She will wake up at night and it is on her head board.  She tolerates the nasal pillow mask and feels the pressure is adequate.  Since going on PAP she feels rested in the am and has no significant daytime sleepiness if she has slept well the night before and kept the mask on.  She denies any significant mouth or nasal dryness or nasal congestion.  She does not think that he snores.    Past Medical History:  Diagnosis Date   Allergy    Back pain    Bipolar disorder 08/16/2012   Cardiomyopathy    a. Echo (10/15):  EF 35-40%, mild MR, mild to moderately reduced RV systolic function, PASP 31 mmHg  ; b.  Echo (12/15):  Posterior basal HK, mild LVH, EF 55%, mild LAE.   Chest pain    Depression    Difficulty sleeping    Edema of both lower extremities     Fatigue    Headache(784.0)    History of chicken pox    Hx of migraines    Hyperlipidemia    Hypertension    Infection    trich   Palpitations    Sleep apnea    SOB (shortness of breath)     Past Surgical History:  Procedure Laterality Date   ADENOIDECTOMY     CESAREAN SECTION N/A 01/17/2014   Procedure: CESAREAN SECTION;  Surgeon: Brock Bad, MD;  Location: [facility];  Service: Obstetrics;  Laterality: N/A;   DILATION AND CURETTAGE OF UTERUS      Current Medications: Current Meds  Medication Sig   acetaminophen (TYLENOL) 500 MG tablet Take 1,000 mg by mouth every 6 (six) hours as needed for moderate pain or headache.   amLODipine (NORVASC) 10 MG tablet Take 1 tablet (10 mg total) by mouth daily.   FLUoxetine (PROZAC) 20 MG capsule Take 20 mg by mouth daily.   guaiFENesin (MUCINEX PO) Take 20 mLs by mouth 2 (two) times daily as needed (congestion).   hydrochlorothiazide (HYDRODIURIL) 50 MG tablet Take 1 tablet (50 mg total) by mouth daily.   metoprolol succinate (TOPROL-XL) 25 MG 24 hr tablet Take 1 tablet (25 mg total) by mouth daily.   topiramate (TOPAMAX) 25 MG tablet  Take 1 tablet (25 mg total) by mouth daily.   traZODone (DESYREL) 50 MG tablet Take 50 mg by mouth at bedtime as needed for sleep.   Vitamin D, Ergocalciferol, (DRISDOL) 1.25 MG (50000 UNIT) CAPS capsule Take 1 capsule (50,000 Units total) by mouth every 7 (seven) days.    Allergies:   Lisinopril   Social History   Socioeconomic History   Marital status: Single    Spouse name: Not on file   Number of children: 0   Years of education: 12+   Highest education level: Not on file  Occupational History    Employer: [employer]   Occupation: EcologistCredit Assistance Specialist/Esthetician  Tobacco Use   Smoking status: Former    Packs/day: 0.50    Years: 5.00    Additional pack years: 0.00    Total pack years: 2.50    Types: Cigarettes   Smokeless tobacco: Never   Tobacco comments:    Quit  smoking 07/2020  Vaping Use   Vaping Use: Never used  Substance and Sexual Activity   Alcohol use: Yes    Comment: socially   Drug use: No   Sexual activity: Yes    Birth control/protection: None    Comment: last intercourse Jul 31 2014  Other Topics Concern   Not on file  Social History Narrative   Regular exercise-no   Caffeine Use-yes   Social Determinants of Health   Financial Resource Strain: Not on file  Food Insecurity: No Food Insecurity (08/03/2022)   Hunger Vital Sign    Worried About Running Out of Food in the Last Year: Never true    Ran Out of Food in the Last Year: Never true  Transportation Needs: No Transportation Needs (08/03/2022)   PRAPARE - Administrator, Civil ServiceTransportation    Lack of Transportation (Medical): No    Lack of Transportation (Non-Medical): No  Physical Activity: Not on file  Stress: Not on file  Social Connections: Not on file     Family History:  The patient's family history includes Heart attack in her mother; Hypertension in her brother, father, maternal grandfather, maternal grandmother, mother, paternal grandfather, and paternal grandmother; Obesity in her mother.   ROS:   Please see the history of present illness.    ROS All other systems reviewed and are negative.      No data to display             PHYSICAL EXAM:   VS:  BP 118/82   Pulse 62   Ht 5\' 6"  (1.676 m)   Wt 265 lb (120.2 kg)   BMI 42.77 kg/m    GEN: Well nourished, well developed, in no acute distress  HEENT: normal  Neck: no JVD, carotid bruits, or masses Cardiac: RRR; no murmurs, rubs, or gallops,no edema.  Intact distal pulses bilaterally.  Respiratory:  clear to auscultation bilaterally, normal work of breathing GI: soft, nontender, nondistended, + BS MS: no deformity or atrophy  Skin: warm and dry, no rash Neuro:  Alert and Oriented x 3, Strength and sensation are intact Psych: euthymic mood, full affect  Wt Readings from Last 3 Encounters:  11/24/22 265 lb (120.2  kg)  11/10/22 264 lb (119.7 kg)  10/27/22 264 lb (119.7 kg)      Studies/Labs Reviewed:   Home sleep study and CPAP titration and Pap compliance download  Recent Labs: 10/27/2022: ALT 20; BUN 7; Creatinine, Ser 1.02; Hemoglobin 13.5; Platelets 282; Potassium 4.2; Sodium 135; TSH 1.160    Additional studies/  records that were reviewed today include:  none    ASSESSMENT:    1. OSA (obstructive sleep apnea)      PLAN:  In order of problems listed above:  OSA - The patient is tolerating PAP therapy well without any problems. The PAP download performed by his DME was personally reviewed and interpreted by me today and showed an AHI of 1.5 /hr on 12 cm H2O with 40% compliance in using more than 4 hours nightly.  The patient has been using and benefiting from PAP use and will continue to benefit from therapy.  -I encouraged her to be more compliant with her device -she finds that she is taking her nasal pillow mask off at night in her sleep -she would like to try a nasal mask to see if that would be harder to get off if she is trying to take it off in her sleep -I am also going to add on a chin strap because she has dryness in the back of her throat in the am   Time Spent: 20 minutes total time of encounter, including 15 minutes spent in face-to-face patient care on the date of this encounter. This time includes coordination of care and counseling regarding above mentioned problem list. Remainder of non-face-to-face time involved reviewing chart documents/testing relevant to the patient encounter and documentation in the medical record. I have independently reviewed documentation from referring provider  Medication Adjustments/Labs and Tests Ordered: Current medicines are reviewed at length with the patient today.  Concerns regarding medicines are outlined above.  Medication changes, Labs and Tests ordered today are listed in the Patient Instructions below.  There are no Patient  Instructions on file for this visit.   Signed, Armanda Magic, MD  11/24/2022 8:40 AM    [facility] [address] [place], [place], [place]  [address] Phone: [phone]; Fax: [phone]

## 2022-11-24 NOTE — Patient Instructions (Signed)
Medication Instructions:  Your physician recommends that you continue on your current medications as directed. Please refer to the Current Medication list given to you today.  *If you need a refill on your cardiac medications before your next appointment, please call your pharmacy*   Lab Work: None.  If you have labs (blood work) drawn today and your tests are completely normal, you will receive your results only by: MyChart Message (if you have MyChart) OR A paper copy in the mail If you have any lab test that is abnormal or we need to change your treatment, we will call you to review the results.   Testing/Procedures: None.   Follow-Up:   Your next appointment:   3 month(s)  Provider:   Dr. Armanda Magic, MD   Other Instructions Dr. Mayford Knife has ordered a new nasal mask and chin strap for you. Either your DME company or our sleep coordinator will be reaching out to you to ensure this is delivered to you.

## 2022-11-24 NOTE — Telephone Encounter (Signed)
-----   Message from Luellen Pucker, RN sent at 11/24/2022  8:54 AM EDT ----- Regarding: DME Dr. Mayford Knife would like to order a new nasal mask w/ chin strap for this patient. Thanks!

## 2022-11-24 NOTE — Telephone Encounter (Signed)
Order placed to [employer] via community message. 

## 2022-11-30 NOTE — Progress Notes (Signed)
TeleHealth Visit:  This visit was completed with telemedicine (audio/video) technology. Tracy Grant has verbally consented to this TeleHealth visit. The patient is located at home, the provider is located at home. The participants in this visit include the listed provider and patient. The visit was conducted today via MyChart video.  OBESITY Tracy Grant is here to discuss her progress with her obesity treatment plan along with follow-up of her obesity related diagnoses.   Today's visit was # 3 Starting weight: 264 lbs Starting date: 10/27/22 Weight at last in office visit: 264 lbs on 11/10/22 Total weight loss: 0 lbs at last in office visit on 11/10/22. Today's reported weight (12/01/22): none reported  Nutrition Plan: the Category 3 plan - 0% adherence.  Interim History:  She has not followed cat 3 plan but is trying to eat less. Only eats one meal per day (lunch). Runs late in am so doesn't eat breakfast. Eats out lunch or eats in cafeteria at work. Lacks hunger in the evening and skips dinner. Since her last appointment is having about 12 drinks on the weekends but none during the week.  Eating all of the prescribed protein: no Skipping meals: Yes Drinking sugar sweetened beverages: No Hunger controlled: well controlled. Cravings controlled:  well controlled.   Pharmacotherapy: Tracy Grant is on Topiramate 25 mg daily Adverse side effects: None Hunger is well controlled.  Cravings are well controlled.  She feels she is making better food choices and having less cravings since starting the topiramate. Assessment/Plan:  1. Vitamin D Deficiency Vitamin D is not at goal of 50.  Most recent vitamin D level was very low at 13.1. She is on  prescription ergocalciferol 50,000 IU weekly. Notes she is sleeping better and has more energy since starting the vitamin D. Lab Results  Component Value Date   VD25OH 13.1 (L) 10/27/2022   VD25OH 22 (L) 06/15/2013    Plan: Continue and  refill  prescription ergocalciferol 50,000 IU weekly   2.  Alcohol use Since last office visit she has not been drinking Monday through Thursday.  She reports having 3-4 drinks on Friday and Sunday and 5-6 drinks on Saturday.  Plan: Set goal with patient to have 1 drink on Sunday, 1 drink on Friday, 2-3 drinks on Saturday.  No drinks Monday through Thursday.  3. Morbid Obesity: Current BMI 42 Pharmacotherapy Plan Continue and refill  Topiramate 25 mg daily Tracy Grant is currently in the action stage of change. As such, her goal is to continue with weight loss efforts.  She has agreed to the Category 3 plan.  1.  Take Tracy Grant delight sandwich, yogurt, or 2 cheese sticks to work to have on her break. 2.  Encouraged her to have dinner but in the absence of having dinner she will have protein snack in the evening such as yogurt, cheese stick.  Exercise goals: No exercise has been prescribed at this time.  Behavioral modification strategies: increasing lean protein intake, decreasing simple carbohydrates , no meal skipping, decrease eating out, decrease liquid calories, and planning for success.  Tracy Grant has agreed to follow-up with our clinic in 4 weeks.   No orders of the defined types were placed in this encounter.   Medications Discontinued During This Encounter  Medication Reason   Vitamin D, Ergocalciferol, (DRISDOL) 1.25 MG (50000 UNIT) CAPS capsule Reorder   topiramate (TOPAMAX) 25 MG tablet Reorder     Meds ordered this encounter  Medications   topiramate (TOPAMAX) 25 MG tablet    Sig:  Take 1 tablet (25 mg total) by mouth daily.    Dispense:  30 tablet    Refill:  0    Order Specific Question:   Supervising Provider    Answer:   Seymour Bars E [[id number]]   Vitamin D, Ergocalciferol, (DRISDOL) 1.25 MG (50000 UNIT) CAPS capsule    Sig: Take 1 capsule (50,000 Units total) by mouth every 7 (seven) days.    Dispense:  16 capsule    Refill:  0    Order Specific Question:    Supervising Provider    Answer:   Glennis Brink [[id number]]      Objective:   VITALS: Per patient if applicable, see vitals. GENERAL: Alert and in no acute distress. CARDIOPULMONARY: No increased WOB. Speaking in clear sentences.  PSYCH: Pleasant and cooperative. Speech normal rate and rhythm. Affect is appropriate. Insight and judgement are appropriate. Attention is focused, linear, and appropriate.  NEURO: Oriented as arrived to appointment on time with no prompting.   Attestation Statements:   Reviewed by clinician on day of visit: allergies, medications, problem list, medical history, surgical history, family history, social history, and previous encounter notes.   This was prepared with the assistance of Engineer, civil (consulting).  Occasional wrong-word or sound-a-like substitutions may have occurred due to the inherent limitations of voice recognition software.

## 2022-12-01 ENCOUNTER — Telehealth (INDEPENDENT_AMBULATORY_CARE_PROVIDER_SITE_OTHER): Payer: BC Managed Care – PPO | Admitting: Family Medicine

## 2022-12-01 ENCOUNTER — Encounter (INDEPENDENT_AMBULATORY_CARE_PROVIDER_SITE_OTHER): Payer: Self-pay | Admitting: Family Medicine

## 2022-12-01 DIAGNOSIS — Z6841 Body Mass Index (BMI) 40.0 and over, adult: Secondary | ICD-10-CM | POA: Diagnosis not present

## 2022-12-01 DIAGNOSIS — Z789 Other specified health status: Secondary | ICD-10-CM

## 2022-12-01 DIAGNOSIS — E559 Vitamin D deficiency, unspecified: Secondary | ICD-10-CM | POA: Diagnosis not present

## 2022-12-01 DIAGNOSIS — F1090 Alcohol use, unspecified, uncomplicated: Secondary | ICD-10-CM

## 2022-12-01 MED ORDER — TOPIRAMATE 25 MG PO TABS: 25.0000 mg | ORAL_TABLET | Freq: Every day | ORAL | 0 refills | Status: DC

## 2022-12-01 MED ORDER — VITAMIN D (ERGOCALCIFEROL) 1.25 MG (50000 UNIT) PO CAPS
50000.0000 [IU] | ORAL_CAPSULE | ORAL | 0 refills | Status: DC
Start: 2022-12-01 — End: 2023-06-16

## 2022-12-02 ENCOUNTER — Ambulatory Visit (INDEPENDENT_AMBULATORY_CARE_PROVIDER_SITE_OTHER): Payer: BC Managed Care – PPO | Admitting: Internal Medicine

## 2022-12-02 DIAGNOSIS — F1010 Alcohol abuse, uncomplicated: Secondary | ICD-10-CM | POA: Diagnosis not present

## 2022-12-02 DIAGNOSIS — G4733 Obstructive sleep apnea (adult) (pediatric): Secondary | ICD-10-CM | POA: Diagnosis not present

## 2022-12-02 DIAGNOSIS — F900 Attention-deficit hyperactivity disorder, predominantly inattentive type: Secondary | ICD-10-CM | POA: Diagnosis not present

## 2022-12-02 DIAGNOSIS — F341 Dysthymic disorder: Secondary | ICD-10-CM | POA: Diagnosis not present

## 2022-12-03 DIAGNOSIS — F900 Attention-deficit hyperactivity disorder, predominantly inattentive type: Secondary | ICD-10-CM | POA: Diagnosis not present

## 2022-12-03 DIAGNOSIS — F1010 Alcohol abuse, uncomplicated: Secondary | ICD-10-CM | POA: Diagnosis not present

## 2022-12-03 DIAGNOSIS — F341 Dysthymic disorder: Secondary | ICD-10-CM | POA: Diagnosis not present

## 2022-12-07 DIAGNOSIS — G4733 Obstructive sleep apnea (adult) (pediatric): Secondary | ICD-10-CM | POA: Diagnosis not present

## 2022-12-14 DIAGNOSIS — F1010 Alcohol abuse, uncomplicated: Secondary | ICD-10-CM | POA: Diagnosis not present

## 2022-12-14 DIAGNOSIS — F341 Dysthymic disorder: Secondary | ICD-10-CM | POA: Diagnosis not present

## 2022-12-14 DIAGNOSIS — F900 Attention-deficit hyperactivity disorder, predominantly inattentive type: Secondary | ICD-10-CM | POA: Diagnosis not present

## 2022-12-15 ENCOUNTER — Other Ambulatory Visit (INDEPENDENT_AMBULATORY_CARE_PROVIDER_SITE_OTHER): Payer: Self-pay | Admitting: Internal Medicine

## 2022-12-27 ENCOUNTER — Ambulatory Visit (INDEPENDENT_AMBULATORY_CARE_PROVIDER_SITE_OTHER): Payer: BC Managed Care – PPO | Admitting: Internal Medicine

## 2022-12-30 ENCOUNTER — Ambulatory Visit (INDEPENDENT_AMBULATORY_CARE_PROVIDER_SITE_OTHER): Payer: BC Managed Care – PPO | Admitting: Family Medicine

## 2022-12-31 DIAGNOSIS — F900 Attention-deficit hyperactivity disorder, predominantly inattentive type: Secondary | ICD-10-CM | POA: Diagnosis not present

## 2022-12-31 DIAGNOSIS — F341 Dysthymic disorder: Secondary | ICD-10-CM | POA: Diagnosis not present

## 2022-12-31 DIAGNOSIS — F1010 Alcohol abuse, uncomplicated: Secondary | ICD-10-CM | POA: Diagnosis not present

## 2023-01-01 DIAGNOSIS — G4733 Obstructive sleep apnea (adult) (pediatric): Secondary | ICD-10-CM | POA: Diagnosis not present

## 2023-01-06 DIAGNOSIS — G4733 Obstructive sleep apnea (adult) (pediatric): Secondary | ICD-10-CM | POA: Diagnosis not present

## 2023-01-19 ENCOUNTER — Other Ambulatory Visit (INDEPENDENT_AMBULATORY_CARE_PROVIDER_SITE_OTHER): Payer: Self-pay | Admitting: Family Medicine

## 2023-01-31 ENCOUNTER — Encounter (INDEPENDENT_AMBULATORY_CARE_PROVIDER_SITE_OTHER): Payer: Self-pay | Admitting: Internal Medicine

## 2023-01-31 ENCOUNTER — Ambulatory Visit (INDEPENDENT_AMBULATORY_CARE_PROVIDER_SITE_OTHER): Payer: BC Managed Care – PPO | Admitting: Internal Medicine

## 2023-01-31 VITALS — BP 134/84 | HR 80 | Temp 98.7°F | Ht 66.0 in | Wt 253.0 lb

## 2023-01-31 DIAGNOSIS — E88819 Insulin resistance, unspecified: Secondary | ICD-10-CM | POA: Diagnosis not present

## 2023-01-31 DIAGNOSIS — Z6841 Body Mass Index (BMI) 40.0 and over, adult: Secondary | ICD-10-CM

## 2023-01-31 DIAGNOSIS — I10 Essential (primary) hypertension: Secondary | ICD-10-CM

## 2023-01-31 MED ORDER — MULTIVITAMINS PO CAPS
1.0000 | ORAL_CAPSULE | Freq: Every day | ORAL | 0 refills | Status: AC
Start: 2023-01-31 — End: ?

## 2023-01-31 NOTE — Assessment & Plan Note (Signed)
Her blood pressure is improved.  She has a history of heart failure with improved ejection fraction.  She is currently on hydrochlorothiazide, amlodipine and metoprolol.  Metoprolol may cause weight gain.  I recommend that she check her blood pressure in the morning and also before bedtime if her blood pressures are above goal of less than 120/80 she will contact her primary care team for medication adjustments.  Most recent renal parameters show normal kidney function and electrolytes.

## 2023-01-31 NOTE — Assessment & Plan Note (Signed)
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 Optimal level < 1.9. This is complex condition associated with genetics, ectopic fat and lifestyle factors. Insulin resistance may result in weight gain, abnormal cravings (particularly for carbs) and fatigue. This may result in additional weight gain and lead to pre-diabetes and diabetes if untreated.   Lab Results  Component Value Date   HGBA1C 4.9 10/27/2022   Lab Results  Component Value Date   INSULIN 13.8 10/27/2022   Lab Results  Component Value Date   GLUCOSE 101 (H) 10/27/2022   GLUCOSE 107 (H) 07/28/2009    We reviewed treatment options which include losing 7 to 10% of body weight, increasing physical activity to a 150 minutes a week of moderate intensity.

## 2023-01-31 NOTE — Assessment & Plan Note (Signed)
Since patient was started on open sympathomimetic which is resulting in appetite suppression we will discontinue topiramate to avoid a restrictive eating pattern as she is skipping meals due to significant decrease in appetite.

## 2023-01-31 NOTE — Progress Notes (Signed)
Office: [phone]  /  Fax: [phone]  WEIGHT SUMMARY AND BIOMETRICS  Vitals Temp: 98.7 F (37.1 C) BP: 134/84 Pulse Rate: 80 SpO2: 97 %   Anthropometric Measurements Height: 5\' 6"  (1.676 m) Weight: 253 lb (114.8 kg) BMI (Calculated): 40.85 Weight at Last Visit: 264 lb Weight Lost Since Last Visit: 11 lb Starting Weight: 264 lb Peak Weight: 275 lb   Body Composition  Body Fat %: 44.4 % Fat Mass (lbs): 112.4 lbs Muscle Mass (lbs): 133.8 lbs Total Body Water (lbs): 90.8 lbs Visceral Fat Rating : 11    No data recorded Today's Visit #: 4  Starting Date: 10/27/22   HPI  Chief Complaint: OBESITY  Tracy Grant is here to discuss her progress with her obesity treatment plan. She is on the the Category 3 Plan and states she is following her eating plan approximately 0 % of the time. She states she is not exercising.  Interval History:  Since last office visit she has lost 11 lbs. Since last OV she was started on Strattera for ADHD. I also had started topiramate.  She reports  skipping meals due decreased appetite.  She has been working on eating out less, working on meal prepping, reducing portion sizes, and taking lunch to work.  Doing one meal a day only. Drinking regula soda and juices. Has been drinking socially.   Orixegenic Control: Denies problems with appetite and hunger signals.  Denies problems with satiety and satiation.  Denies problems with eating patterns and portion control.  Denies abnormal cravings. Denies feeling deprived or restricted.   Barriers identified: lack of time for self-care, predilection for convenience or prepackaged foods, work schedule, and presence of obesogenic drugs.   Pharmacotherapy for weight loss: She is currently taking  sympathomimetic for ADHD .  She is also on topiramate 25 mg once a day off label use for appetite suppression.   ASSESSMENT AND PLAN  TREATMENT PLAN FOR OBESITY:  Recommended Dietary  Goals  Tracy Grant is currently in the action stage of change. As such, her goal is to continue weight management plan. She has agreed to: portion control, balanced plate and making smarter food choices, such as increasing vegetables, protein intake and reducing simple carbohydrates and processed foods   Behavioral Intervention  We discussed the following Behavioral Modification Strategies today: increasing lean protein intake, decreasing simple carbohydrates , increasing vegetables, increasing lower glycemic fruits, increasing fiber rich foods, avoiding skipping meals, increasing water intake, identifying sources and decreasing liquid calories, continue to practice mindfulness when eating, and planning for success.  Additional resources provided today: None  Recommended Physical Activity Goals  Tracy Grant has been advised to work up to 150 minutes of moderate intensity aerobic activity a week and strengthening exercises 2-3 times per week for cardiovascular health, weight loss maintenance and preservation of muscle mass.   She has agreed to :  Think about ways to increase daily physical activity and overcoming barriers to exercise, Increase physical activity in their day and reduce sedentary time (increase NEAT)., and goal 7500 steps  Pharmacotherapy We discussed various medication options to help Tracy Grant with her weight loss efforts and we both agreed to : discontinue topiramate due to restrictive eating since starting straterra by psych.  ASSOCIATED CONDITIONS ADDRESSED TODAY  Class 3 severe obesity with serious comorbidity and body mass index (BMI) of 40.0 to 44.9 in adult, unspecified obesity type [facility]) Assessment & Plan: Since patient was started on open sympathomimetic which is resulting in appetite suppression we will discontinue  topiramate to avoid a restrictive eating pattern as she is skipping meals due to significant decrease in appetite.  Orders: -     Multivitamins; Take 1  capsule by mouth daily.  Dispense: 30 capsule; Refill: 0  Insulin resistance Assessment & Plan: Her HOMA-IR is 3.44 which is elevated. Optimal level < 1.9. This is complex condition associated with genetics, ectopic fat and lifestyle factors. Insulin resistance may result in weight gain, abnormal cravings (particularly for carbs) and fatigue. This may result in additional weight gain and lead to pre-diabetes and diabetes if untreated.   Lab Results  Component Value Date   HGBA1C 4.9 10/27/2022   Lab Results  Component Value Date   INSULIN 13.8 10/27/2022   Lab Results  Component Value Date   GLUCOSE 101 (H) 10/27/2022   GLUCOSE 107 (H) 07/28/2009    We reviewed treatment options which include losing 7 to 10% of body weight, increasing physical activity to a 150 minutes a week of moderate intensity.    Benign essential HTN Assessment & Plan: Her blood pressure is improved.  She has a history of heart failure with improved ejection fraction.  She is currently on hydrochlorothiazide, amlodipine and metoprolol.  Metoprolol may cause weight gain.  I recommend that she check her blood pressure in the morning and also before bedtime if her blood pressures are above goal of less than 120/80 she will contact her primary care team for medication adjustments.  Most recent renal parameters show normal kidney function and electrolytes.     PHYSICAL EXAM:  Blood pressure 134/84, pulse 80, temperature 98.7 F (37.1 C), height 5\' 6"  (1.676 m), weight 253 lb (114.8 kg), SpO2 97 %. Body mass index is 40.84 kg/m.  General: She is overweight, cooperative, alert, well developed, and in no acute distress. PSYCH: Has normal mood, affect and thought process.   HEENT: EOMI, sclerae are anicteric. Lungs: Normal breathing effort, no conversational dyspnea. Extremities: No edema.  Neurologic: No gross sensory or motor deficits. No tremors or fasciculations noted.    DIAGNOSTIC DATA  REVIEWED:  BMET    Component Value Date/Time   NA 135 10/27/2022 1000   K 4.2 10/27/2022 1000   CL 98 10/27/2022 1000   CO2 23 10/27/2022 1000   GLUCOSE 101 (H) 10/27/2022 1000   GLUCOSE 99 05/03/2021 0357   BUN 7 10/27/2022 1000   CREATININE 1.02 (H) 10/27/2022 1000   CREATININE 0.78 08/27/2015 1221   CALCIUM 9.8 10/27/2022 1000   GFRNONAA >60 05/03/2021 0357   GFRNONAA >89 08/27/2015 1221   GFRAA >60 03/24/2017 0258   GFRAA >89 08/27/2015 1221   Lab Results  Component Value Date   HGBA1C 4.9 10/27/2022   HGBA1C 5.1 08/08/2012   Lab Results  Component Value Date   INSULIN 13.8 10/27/2022   Lab Results  Component Value Date   TSH 1.160 10/27/2022   CBC    Component Value Date/Time   WBC 3.8 10/27/2022 1000   WBC 4.7 05/03/2021 0357   RBC 4.06 10/27/2022 1000   RBC 4.21 05/03/2021 0357   HGB 13.5 10/27/2022 1000   HCT 40.4 10/27/2022 1000   PLT 282 10/27/2022 1000   MCV 100 (H) 10/27/2022 1000   MCH 33.3 (H) 10/27/2022 1000   MCH 33.7 05/03/2021 0357   MCHC 33.4 10/27/2022 1000   MCHC 35.0 05/03/2021 0357   RDW 13.1 10/27/2022 1000   Iron Studies No results found for: "IRON", "TIBC", "FERRITIN", "IRONPCTSAT" Lipid Panel  Component Value Date/Time   CHOL 204 (H) 10/27/2022 1000   TRIG 190 (H) 10/27/2022 1000   HDL 45 10/27/2022 1000   CHOLHDL 4 08/08/2012 0903   VLDL 39.6 08/08/2012 0903   LDLCALC 125 (H) 10/27/2022 1000   Hepatic Function Panel     Component Value Date/Time   PROT 7.3 10/27/2022 1000   ALBUMIN 4.6 10/27/2022 1000   AST 22 10/27/2022 1000   ALT 20 10/27/2022 1000   ALKPHOS 59 10/27/2022 1000   BILITOT 0.6 10/27/2022 1000      Component Value Date/Time   TSH 1.160 10/27/2022 1000   Nutritional Lab Results  Component Value Date   VD25OH 13.1 (L) 10/27/2022   VD25OH 22 (L) 06/15/2013     Return in about 3 weeks (around 02/21/2023) for For Weight Mangement with Dr. Rikki Spearing.Marland Kitchen She was informed of the importance of  frequent follow up visits to maximize her success with intensive lifestyle modifications for her multiple health conditions.   ATTESTASTION STATEMENTS:  Reviewed by clinician on day of visit: allergies, medications, problem list, medical history, surgical history, family history, social history, and previous encounter notes.     Worthy Rancher, MD

## 2023-02-01 DIAGNOSIS — G4733 Obstructive sleep apnea (adult) (pediatric): Secondary | ICD-10-CM | POA: Diagnosis not present

## 2023-02-04 DIAGNOSIS — F900 Attention-deficit hyperactivity disorder, predominantly inattentive type: Secondary | ICD-10-CM | POA: Diagnosis not present

## 2023-02-04 DIAGNOSIS — F1010 Alcohol abuse, uncomplicated: Secondary | ICD-10-CM | POA: Diagnosis not present

## 2023-02-04 DIAGNOSIS — F341 Dysthymic disorder: Secondary | ICD-10-CM | POA: Diagnosis not present

## 2023-02-06 DIAGNOSIS — G4733 Obstructive sleep apnea (adult) (pediatric): Secondary | ICD-10-CM | POA: Diagnosis not present

## 2023-02-24 NOTE — Progress Notes (Deleted)
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  History of Present Illness:    Tracy Grant is a 37 y.o. female with a history of atrial flutter and reduced EF 4 months after the delivery of her son with subsequent recovery of EF, hypertension, tobacco abuse, morbid obesity.  She presents for follow-up.  03/10/23: ***  Prior visits:  She does occasionally note palpitations with exertion and is trying to be more active and lose weight.  Her heart rate on her Apple Watch does not go over about 140 and she does not note irregularity.  We discussed that this is probably due to deconditioning but she should watch it closely.  When not exerting herself she will have some episodes of chest tightening that stops her in her tracks but does not experience this with exertion and does not experience it with elevated heart rates.  She does get short of breath with activity and also when singing karaoke and dancing.  She feels this is weight related since it did not happen in the past.  She is anticipating establishing with [facility] and I have encouraged this.  We discussed in detail risks and benefits of radiation based stress testing in 37 year old female with possible cardiac chest pain.  If she continues to exercise yet feels her symptoms are worsening, would consider coronary CTA at that point.  The calcium scoring associated with coronary CTA will also assist [place] in understanding how to manage her lipids, and if calcium scoring is ordered I would encourage a CTA to be performed instead.  She has started using a CPAP after a diagnosis of sleep apnea and notes this  is helping.  She feels less tired during the day.  Her son will be 9 soon and they are working on healthy lifestyle together.  PAST HISTORY: Initial cardiology notes from May 16, 2014 suggest a history of hyperlipidemia and prehypertension at that time, with a family history of her mother having an MI in her early 45s with stent, though after Dr. Tenny Craw spoke to her mother, this event may have been embolic in nature, and her mother eventually underwent catheter ablation for atrial flutter.  Patient gave birth in June 2015 with an uncomplicated pregnancy and C-section delivery.  Delivery was felt to be uncomplicated.  Patient noticed intermittent palpitations since her delivery lasting seconds to minutes without chest pain.  In September 2015 she experienced a significant respiratory infection with fever, chills, runny nose, cough.  Her cough persisted.  She at that time denied lower extremity edema, orthopnea, PND.  Just prior to presenting to the hospital she began to have PND with coughing, and was also noted to be tachycardic into the 150s.  She was found of atrial flutter with 2-1 conduction.  She underwent successful TEE cardioversion with 1 150 J shock on 05/17/2014. Despite viral symptoms, troponin negative, suggesting against myocarditis for cause of low EF.   On further review it was unclear whether her cardiomyopathy was peripartum cardiomyopathy or just tachycardia mediated cardiomyopathy.  Ejection fraction was felt to be 35 to 40%.  She was continued on medications for short period of time but did  not have insurance and discontinued these.  She was previously taking Xarelto but discontinued for the same reason.  Fortunately by December 2015 her EF normalized.  There was still a suggestion of posterior basal hypokinesis and mild LV dilatation.   Her next follow-up appointment appears to be October 2016.  She had a visit with EP, but did not pursue flutter ablation.  She was next seen by cardiology  in August 2018, after significant alcohol consumption and feeling poorly as a result.  She was noted to be again in atrial flutter, with plan for TEE cardioversion, however she spontaneously converted   Past Medical History:  Diagnosis Date   Allergy    Back pain    Bipolar disorder (HCC) 08/16/2012   Cardiomyopathy (HCC)    a. Echo (10/15):  EF 35-40%, mild MR, mild to moderately reduced RV systolic function, PASP 31 mmHg  ; b.  Echo (12/15):  Posterior basal HK, mild LVH, EF 55%, mild LAE.   Chest pain    Depression    Difficulty sleeping    Edema of both lower extremities    Fatigue    Headache(784.0)    History of chicken pox    Hx of migraines    Hyperlipidemia    Hypertension    Infection    trich   Palpitations    Sleep apnea    SOB (shortness of breath)     Past Surgical History:  Procedure Laterality Date   ADENOIDECTOMY     CESAREAN SECTION N/A 01/17/2014   Procedure: CESAREAN SECTION;  Surgeon: Brock Bad, MD;  Location: [facility];  Service: Obstetrics;  Laterality: N/A;   DILATION AND CURETTAGE OF UTERUS      Current Medications: No outpatient medications have been marked as taking for the 03/10/23 encounter (Appointment) with Parke Poisson, MD.     Allergies:   Lisinopril   Social History   Socioeconomic History   Marital status: Single    Spouse name: Not on file   Number of children: 0   Years of education: 12+   Highest education level: Not on file  Occupational History    Employer: Advertising copywriter   Occupation: Ecologist  Tobacco Use   Smoking status: Former    Current packs/day: 0.50    Average packs/day: 0.5 packs/day for 5.0 years (2.5 ttl pk-yrs)    Types: Cigarettes   Smokeless tobacco: Never   Tobacco comments:    Quit smoking 07/2020  Vaping Use   Vaping status: Never Used  Substance and Sexual Activity   Alcohol use: Yes    Comment: socially   Drug use: No   Sexual activity: Yes    Birth  control/protection: None    Comment: last intercourse Jul 31 2014  Other Topics Concern   Not on file  Social History Narrative   Regular exercise-no   Caffeine Use-yes   Social Determinants of Health   Financial Resource Strain: Not on file  Food Insecurity: No Food Insecurity (08/03/2022)   Hunger Vital Sign    Worried About Running Out of Food in the Last Year: Never true    Ran Out of Food in the Last Year: Never true  Transportation Needs: No Transportation Needs (08/03/2022)   PRAPARE - Administrator, Civil Service (Medical): No    Lack of Transportation (Non-Medical): No  Physical Activity: Not on file  Stress: Not on file  Social Connections: Not on file  Family History: The patient's family history includes Heart attack in her mother; Hypertension in her brother, father, maternal grandfather, maternal grandmother, mother, paternal grandfather, and paternal grandmother; Obesity in her mother.  ROS:   Please see the history of present illness.    All other systems reviewed and are negative.  EKGs/Labs/Other Studies Reviewed:    The following studies were reviewed today:  EKG:     09/13/22:  Sinus rhythm, short PR interval, LVH with repolarization abnormality diffusely.  Recent Labs: 10/27/2022: ALT 20; BUN 7; Creatinine, Ser 1.02; Hemoglobin 13.5; Platelets 282; Potassium 4.2; Sodium 135; TSH 1.160  Recent Lipid Panel    Component Value Date/Time   CHOL 204 (H) 10/27/2022 1000   TRIG 190 (H) 10/27/2022 1000   HDL 45 10/27/2022 1000   CHOLHDL 4 08/08/2012 0903   VLDL 39.6 08/08/2012 0903   LDLCALC 125 (H) 10/27/2022 1000    Physical Exam:    VS:  There were no vitals taken for this visit.    Wt Readings from Last 5 Encounters:  01/31/23 253 lb (114.8 kg)  11/24/22 265 lb (120.2 kg)  11/10/22 264 lb (119.7 kg)  10/27/22 264 lb (119.7 kg)  09/30/22 266 lb (120.7 kg)     Constitutional: No acute distress Eyes: sclera non-icteric, normal  conjunctiva and lids ENMT: normal dentition, moist mucous membranes Cardiovascular: regular rhythm, normal rate, no murmurs. S1 and S2 normal. Radial pulses normal bilaterally. No jugular venous distention.  Respiratory: clear to auscultation bilaterally GI : normal bowel sounds, soft and nontender. No distention.   MSK: extremities warm, well perfused. No edema.  NEURO: grossly nonfocal exam, moves all extremities. PSYCH: alert and oriented x 3, normal mood and affect.   ASSESSMENT:    1. Atrial flutter, unspecified type (HCC)   2. Postpartum cardiomyopathy   3. OSA (obstructive sleep apnea)   4. Somnolence, daytime   5. Morbid obesity (HCC)   6. Nonspecific abnormal electrocardiogram (ECG) (EKG)   7. Chest pain, unspecified type   8. Palpitations      PLAN:    Atrial flutter, unspecified type (HCC) - Plan: EKG 12-Lead -No known recurrence.  She has a smart watch which monitors her heart rate and she will keep an eye on this.  Postpartum cardiomyopathy -Most recent echocardiogram with preserved EF and moderate LVH which is likely the source of her abnormal EKG.  We discussed preconception counseling today and I have informed her of our cardio OB clinic should she plan a pregnancy.  Currently she is not planning to get pregnant but will inform us if she does.  Morbid obesity (HCC) -Establishing with healthy weight and wellness center, strongly encourage.  Discussed Mediterranean diet today and healthy eating strategies with children.  OSA (obstructive sleep apnea) -Encourage compliance with CPAP  Nonspecific abnormal electrocardiogram (ECG) (EKG) -Likely repolarization change in the setting of LVH  Chest pain, unspecified type Palpitations  -If chest pain continues or occurs with activity, would consider coronary CTA.  She may benefit from a coronary assessment to better understand how to manage her lipids.  Would not plan to perform a coronary calcium study in isolation she  would benefit more from an anatomic assessment.  Palpitations are likely related to deconditioning, if they worsen or if heart rate is elevated or irregular, would consider cardiac monitor at that time.  She is in agreement with this plan.   Total time of encounter: 30 minutes total time of encounter, including 25 minutes spent in face-to-face  patient care on the date of this encounter. This time includes coordination of care and counseling regarding above mentioned problem list. Remainder of non-face-to-face time involved reviewing chart documents/testing relevant to the patient encounter and documentation in the medical record. I have independently reviewed documentation from referring provider.   Weston Brass, MD, [facility] [place]  [facility]    Medication Adjustments/Labs and Tests Ordered: Current medicines are reviewed at length with the patient today.  Concerns regarding medicines are outlined above.  No orders of the defined types were placed in this encounter.  No orders of the defined types were placed in this encounter.   There are no Patient Instructions on file for this visit.

## 2023-03-03 DIAGNOSIS — G4733 Obstructive sleep apnea (adult) (pediatric): Secondary | ICD-10-CM | POA: Diagnosis not present

## 2023-03-03 DIAGNOSIS — B9689 Other specified bacterial agents as the cause of diseases classified elsewhere: Secondary | ICD-10-CM | POA: Diagnosis not present

## 2023-03-03 DIAGNOSIS — F5101 Primary insomnia: Secondary | ICD-10-CM | POA: Diagnosis not present

## 2023-03-03 DIAGNOSIS — N760 Acute vaginitis: Secondary | ICD-10-CM | POA: Diagnosis not present

## 2023-03-03 DIAGNOSIS — F411 Generalized anxiety disorder: Secondary | ICD-10-CM | POA: Diagnosis not present

## 2023-03-07 DIAGNOSIS — F341 Dysthymic disorder: Secondary | ICD-10-CM | POA: Diagnosis not present

## 2023-03-07 DIAGNOSIS — F1010 Alcohol abuse, uncomplicated: Secondary | ICD-10-CM | POA: Diagnosis not present

## 2023-03-07 DIAGNOSIS — F900 Attention-deficit hyperactivity disorder, predominantly inattentive type: Secondary | ICD-10-CM | POA: Diagnosis not present

## 2023-03-10 ENCOUNTER — Ambulatory Visit: Payer: BC Managed Care – PPO | Attending: Internal Medicine | Admitting: Internal Medicine

## 2023-03-10 ENCOUNTER — Ambulatory Visit (INDEPENDENT_AMBULATORY_CARE_PROVIDER_SITE_OTHER): Payer: BC Managed Care – PPO | Admitting: Internal Medicine

## 2023-03-10 DIAGNOSIS — R9431 Abnormal electrocardiogram [ECG] [EKG]: Secondary | ICD-10-CM

## 2023-03-10 DIAGNOSIS — R002 Palpitations: Secondary | ICD-10-CM

## 2023-03-10 DIAGNOSIS — I4892 Unspecified atrial flutter: Secondary | ICD-10-CM

## 2023-03-10 DIAGNOSIS — R079 Chest pain, unspecified: Secondary | ICD-10-CM

## 2023-03-10 DIAGNOSIS — O903 Peripartum cardiomyopathy: Secondary | ICD-10-CM

## 2023-03-10 DIAGNOSIS — R400 Somnolence: Secondary | ICD-10-CM

## 2023-03-10 DIAGNOSIS — G4733 Obstructive sleep apnea (adult) (pediatric): Secondary | ICD-10-CM

## 2023-03-15 ENCOUNTER — Ambulatory Visit: Payer: BC Managed Care – PPO | Admitting: Cardiology

## 2023-04-03 DIAGNOSIS — G4733 Obstructive sleep apnea (adult) (pediatric): Secondary | ICD-10-CM | POA: Diagnosis not present

## 2023-04-04 ENCOUNTER — Ambulatory Visit (INDEPENDENT_AMBULATORY_CARE_PROVIDER_SITE_OTHER): Payer: BC Managed Care – PPO | Admitting: Internal Medicine

## 2023-04-05 DIAGNOSIS — F3132 Bipolar disorder, current episode depressed, moderate: Secondary | ICD-10-CM | POA: Diagnosis not present

## 2023-04-05 DIAGNOSIS — F3112 Bipolar disorder, current episode manic without psychotic features, moderate: Secondary | ICD-10-CM | POA: Diagnosis not present

## 2023-05-02 DIAGNOSIS — F3112 Bipolar disorder, current episode manic without psychotic features, moderate: Secondary | ICD-10-CM | POA: Diagnosis not present

## 2023-05-02 DIAGNOSIS — F3132 Bipolar disorder, current episode depressed, moderate: Secondary | ICD-10-CM | POA: Diagnosis not present

## 2023-05-02 DIAGNOSIS — F411 Generalized anxiety disorder: Secondary | ICD-10-CM | POA: Diagnosis not present

## 2023-05-04 DIAGNOSIS — G4733 Obstructive sleep apnea (adult) (pediatric): Secondary | ICD-10-CM | POA: Diagnosis not present

## 2023-05-05 ENCOUNTER — Other Ambulatory Visit: Payer: Self-pay | Admitting: General Practice

## 2023-05-11 DIAGNOSIS — F4323 Adjustment disorder with mixed anxiety and depressed mood: Secondary | ICD-10-CM | POA: Diagnosis not present

## 2023-05-23 ENCOUNTER — Ambulatory Visit (INDEPENDENT_AMBULATORY_CARE_PROVIDER_SITE_OTHER): Payer: BC Managed Care – PPO | Admitting: Internal Medicine

## 2023-05-23 ENCOUNTER — Encounter (INDEPENDENT_AMBULATORY_CARE_PROVIDER_SITE_OTHER): Payer: Self-pay

## 2023-05-24 ENCOUNTER — Ambulatory Visit: Payer: BC Managed Care – PPO | Attending: Cardiology | Admitting: Physician Assistant

## 2023-05-24 NOTE — Progress Notes (Deleted)
  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  Tracy Grant is a 37 y.o. female who returns for follow up of postpartum vs tachycardia mediated cardiomyopathy, atrial flutter. She was last seen by Dr. Jacques Navy in 08/2022. Pt has a hx of chest pain and it was recommended she have a CCTA if chest pain continued.          ROS   See HPI ***    Studies Reviewed:       *** Risk Assessment/Calculations:   {Does this patient have ATRIAL FIBRILLATION?:[id number]} No BP recorded.  {Refresh Note OR Click here to enter BP  :1}***   STOP-Bang Score:     { Consider Dx Sleep Disordered Breathing or Sleep Apnea  ICD G47.33          :1}    Physical Exam:   VS:  There were no vitals taken for this visit.   Wt Readings from Last 3 Encounters:  01/31/23 253 lb (114.8 kg)  11/24/22 265 lb (120.2 kg)  11/10/22 264 lb (119.7 kg)    Physical Exam***     Assessment and Plan:   Assessment & Plan Heart failure with improved ejection fraction (HFimpEF) (HCC)  Atrial flutter,  unspecified type (HCC)  LVH (left ventricular hypertrophy)   Assessment and Plan             {      :1}    {Are you ordering a CV Procedure (e.g. stress test, cath, DCCV, TEE, etc)?   Press F2        :[id number]}  Dispo:  No follow-ups on file.  Signed, Tereso Newcomer, PA-C

## 2023-05-26 DIAGNOSIS — F4323 Adjustment disorder with mixed anxiety and depressed mood: Secondary | ICD-10-CM | POA: Diagnosis not present

## 2023-06-02 DIAGNOSIS — F411 Generalized anxiety disorder: Secondary | ICD-10-CM | POA: Diagnosis not present

## 2023-06-02 DIAGNOSIS — F3132 Bipolar disorder, current episode depressed, moderate: Secondary | ICD-10-CM | POA: Diagnosis not present

## 2023-06-03 DIAGNOSIS — G4733 Obstructive sleep apnea (adult) (pediatric): Secondary | ICD-10-CM | POA: Diagnosis not present

## 2023-06-16 ENCOUNTER — Encounter (INDEPENDENT_AMBULATORY_CARE_PROVIDER_SITE_OTHER): Payer: Self-pay | Admitting: Internal Medicine

## 2023-06-16 ENCOUNTER — Ambulatory Visit (INDEPENDENT_AMBULATORY_CARE_PROVIDER_SITE_OTHER): Payer: BC Managed Care – PPO | Admitting: Internal Medicine

## 2023-06-16 VITALS — BP 169/102 | HR 90 | Temp 98.4°F | Ht 66.0 in | Wt 251.0 lb

## 2023-06-16 DIAGNOSIS — G4733 Obstructive sleep apnea (adult) (pediatric): Secondary | ICD-10-CM

## 2023-06-16 DIAGNOSIS — E559 Vitamin D deficiency, unspecified: Secondary | ICD-10-CM

## 2023-06-16 DIAGNOSIS — I10 Essential (primary) hypertension: Secondary | ICD-10-CM

## 2023-06-16 DIAGNOSIS — Z6841 Body Mass Index (BMI) 40.0 and over, adult: Secondary | ICD-10-CM

## 2023-06-16 DIAGNOSIS — E66813 Obesity, class 3: Secondary | ICD-10-CM | POA: Diagnosis not present

## 2023-06-16 DIAGNOSIS — F4323 Adjustment disorder with mixed anxiety and depressed mood: Secondary | ICD-10-CM | POA: Diagnosis not present

## 2023-06-16 MED ORDER — VITAMIN D (ERGOCALCIFEROL) 1.25 MG (50000 UNIT) PO CAPS
50000.0000 [IU] | ORAL_CAPSULE | ORAL | 0 refills | Status: AC
Start: 2023-06-16 — End: ?

## 2023-06-16 MED ORDER — TOPIRAMATE 25 MG PO TABS
25.0000 mg | ORAL_TABLET | Freq: Every day | ORAL | 0 refills | Status: DC
Start: 2023-06-16 — End: 2023-06-29

## 2023-06-16 NOTE — Progress Notes (Signed)
Office: [phone]  /  Fax: [phone]  WEIGHT SUMMARY AND BIOMETRICS  Vitals Temp: 98.4 F (36.9 C) BP: (!) 169/102 Pulse Rate: 90 SpO2: 99 %   Anthropometric Measurements Height: 5\' 6"  (1.676 m) Weight: 251 lb (113.9 kg) BMI (Calculated): 40.53 Weight at Last Visit: 253 lb Weight Lost Since Last Visit: 2 lb Weight Gained Since Last Visit: 0 lb Starting Weight: 264 lb Total Weight Loss (lbs): 13 lb (5.897 kg) Peak Weight: 275 lb   Body Composition  Body Fat %: 46.3 % Fat Mass (lbs): 116.4 lbs Muscle Mass (lbs): 128 lbs Total Body Water (lbs): 93.8 lbs Visceral Fat Rating : 12    No data recorded Today's Visit #: 5  Starting Date: 10/27/22   HPI  Chief Complaint: OBESITY  Tracy Grant is here to discuss her progress with her obesity treatment plan. She is on the the Category 3 Plan and states she is following her eating plan approximately 0 % of the time. She states she is not exercising.  Interval History:   Discussed the use of AI scribe software for clinical note transcription with the patient, who gave verbal consent to proceed.  History of Present Illness   The patient presents today after a period of absence for follow-up consultation and weight management.  She has a history of hypertension, sleep apnea. She reports a significant amount of personal stress due to recent family conflicts and the loss of a grandparent. Despite these stressors, she has managed to lose weight, dropping from 270 lbs to 251 lbs. She attributes this weight loss to increased home cooking, although she admits her meals are not always the healthiest due to financial constraints.  The patient has been non-compliant with her prescribed hypertension medications, including hydrochlorothiazide, amlodipine, and metoprolol, leading to a high blood pressure reading at this visit. She also reports inconsistent use of her sleep apnea device, only recently returning to regular use.  She  expresses a desire to restart topiramate, a medication previously discontinued due to lack of appetite, as she believes it helped control her eating and drinking habits. She also requests a meal plan and information on protein shakes and smoothies to aid in her weight management efforts. She is committed to getting her health back in order and plans to follow up in three weeks.      Barriers identified: multiple competing priorities, strong hunger signals and impaired satiety / inhibitory control, and moderate to high levels of stress.   Pharmacotherapy for weight loss: She is currently taking no anti-obesity medication.    ASSESSMENT AND PLAN  TREATMENT PLAN FOR OBESITY:  Recommended Dietary Goals  Tracy Grant is currently in the action stage of change. As such, her goal is to continue weight management plan. She has agreed to:  She was provided with a copy of her category 3 plan she will work on reimplementation of reduced calorie nutrition plan.  Behavioral Intervention  We discussed the following Behavioral Modification Strategies today: work on managing stress, creating time for self-care and relaxation and continue to work on maintaining a reduced calorie state, getting the recommended amount of protein, incorporating whole foods, making healthy choices, staying well hydrated and practicing mindfulness when eating..  Additional resources provided today: None  Recommended Physical Activity Goals  Tracy Grant has been advised to work up to 150 minutes of moderate intensity aerobic activity a week and strengthening exercises 2-3 times per week for cardiovascular health, weight loss maintenance and preservation of muscle mass.   She  has agreed to :  Think about enjoyable ways to increase daily physical activity and overcoming barriers to exercise and Increase physical activity in their day and reduce sedentary time (increase NEAT).  Pharmacotherapy We discussed various medication options  to help Tracy Grant with her weight loss efforts and we both agreed to : start anti-obesity medication.  In addition to reduced calorie nutrition plan (RCNP), behavioral strategies and physical activity, Tracy Grant would benefit from pharmacotherapy to assist with hunger signals, satiety and cravings. This will reduce obesity-related health risks by inducing weight loss, and help reduce food consumption and adherence to [facility]) . It may also improve QOL by improving self-confidence and reduce the  setbacks associated with metabolic adaptations.  After discussion of treatment options, mechanisms of action, benefits, side effects, contraindications and shared decision making she is agreeable to starting topiramate 25 mg once a week.  She has an IUD.  ASSOCIATED CONDITIONS ADDRESSED TODAY  Essential hypertension Assessment & Plan: Blood pressure is uncontrolled today.  She has not been taking her blood pressure medications.  She has a history of heart failure with improved ejection fraction.  She is currently on hydrochlorothiazide, amlodipine and metoprolol.  She will start her medication regimen today and will begin monitoring and work with her primary care team for treatment intensification.   patient counseled on the risk associated with uncontrolled blood pressure.  Also starting PAP therapy would help with blood pressure control.   Vitamin D deficiency Assessment & Plan: On high-dose vitamin D supplementation.  She will take another 2 months and will be transition to over-the-counter supplementation.  Orders: -     Vitamin D (Ergocalciferol); Take 1 capsule (50,000 Units total) by mouth every 7 (seven) days.  Dispense: 8 capsule; Refill: 0  LVH (left ventricular hypertrophy)  Class 3 severe obesity with serious comorbidity and body mass index (BMI) of 40.0 to 44.9 in adult, unspecified obesity type [facility]) Assessment & Plan: See obesity treatment plan  Orders: -     Topiramate; Take 1 tablet (25  mg total) by mouth daily.  Dispense: 30 tablet; Refill: 0  OSA on CPAP Assessment & Plan: Patient counseled on the risk associated with untreated sleep apnea.  She has resumed treatment.  Losing 15% of body weight may reduce AHI.     PHYSICAL EXAM:  Blood pressure (!) 169/102, pulse 90, temperature 98.4 F (36.9 C), height 5\' 6"  (1.676 m), weight 251 lb (113.9 kg), SpO2 99%. Body mass index is 40.51 kg/m.  General: She is overweight, cooperative, alert, well developed, and in no acute distress. PSYCH: Has normal mood, affect and thought process.   HEENT: EOMI, sclerae are anicteric. Lungs: Normal breathing effort, no conversational dyspnea. Extremities: No edema.  Neurologic: No gross sensory or motor deficits. No tremors or fasciculations noted.    DIAGNOSTIC DATA REVIEWED:  BMET    Component Value Date/Time   NA 135 10/27/2022 1000   K 4.2 10/27/2022 1000   CL 98 10/27/2022 1000   CO2 23 10/27/2022 1000   GLUCOSE 101 (H) 10/27/2022 1000   GLUCOSE 99 05/03/2021 0357   BUN 7 10/27/2022 1000   CREATININE 1.02 (H) 10/27/2022 1000   CREATININE 0.78 08/27/2015 1221   CALCIUM 9.8 10/27/2022 1000   GFRNONAA >60 05/03/2021 0357   GFRNONAA >89 08/27/2015 1221   GFRAA >60 03/24/2017 0258   GFRAA >89 08/27/2015 1221   Lab Results  Component Value Date   HGBA1C 4.9 10/27/2022   HGBA1C 5.1 08/08/2012   Lab  Results  Component Value Date   INSULIN 13.8 10/27/2022   Lab Results  Component Value Date   TSH 1.160 10/27/2022   CBC    Component Value Date/Time   WBC 3.8 10/27/2022 1000   WBC 4.7 05/03/2021 0357   RBC 4.06 10/27/2022 1000   RBC 4.21 05/03/2021 0357   HGB 13.5 10/27/2022 1000   HCT 40.4 10/27/2022 1000   PLT 282 10/27/2022 1000   MCV 100 (H) 10/27/2022 1000   MCH 33.3 (H) 10/27/2022 1000   MCH 33.7 05/03/2021 0357   MCHC 33.4 10/27/2022 1000   MCHC 35.0 05/03/2021 0357   RDW 13.1 10/27/2022 1000   Iron Studies No results found for: "IRON", "TIBC",  "FERRITIN", "IRONPCTSAT" Lipid Panel     Component Value Date/Time   CHOL 204 (H) 10/27/2022 1000   TRIG 190 (H) 10/27/2022 1000   HDL 45 10/27/2022 1000   CHOLHDL 4 08/08/2012 0903   VLDL 39.6 08/08/2012 0903   LDLCALC 125 (H) 10/27/2022 1000   Hepatic Function Panel     Component Value Date/Time   PROT 7.3 10/27/2022 1000   ALBUMIN 4.6 10/27/2022 1000   AST 22 10/27/2022 1000   ALT 20 10/27/2022 1000   ALKPHOS 59 10/27/2022 1000   BILITOT 0.6 10/27/2022 1000      Component Value Date/Time   TSH 1.160 10/27/2022 1000   Nutritional Lab Results  Component Value Date   VD25OH 13.1 (L) 10/27/2022   VD25OH 22 (L) 06/15/2013     No follow-ups on file.Marland Kitchen She was informed of the importance of frequent follow up visits to maximize her success with intensive lifestyle modifications for her multiple health conditions.   ATTESTASTION STATEMENTS:  Reviewed by clinician on day of visit: allergies, medications, problem list, medical history, surgical history, family history, social history, and previous encounter notes.     Worthy Rancher, MD

## 2023-06-16 NOTE — Assessment & Plan Note (Signed)
Blood pressure is uncontrolled today.  She has not been taking her blood pressure medications.  She has a history of heart failure with improved ejection fraction.  She is currently on hydrochlorothiazide, amlodipine and metoprolol.  She will start her medication regimen today and will begin monitoring and work with her primary care team for treatment intensification.   patient counseled on the risk associated with uncontrolled blood pressure.  Also starting PAP therapy would help with blood pressure control.

## 2023-06-16 NOTE — Assessment & Plan Note (Signed)
On high-dose vitamin D supplementation.  She will take another 2 months and will be transition to over-the-counter supplementation.

## 2023-06-16 NOTE — Assessment & Plan Note (Signed)
Patient counseled on the risk associated with untreated sleep apnea.  She has resumed treatment.  Losing 15% of body weight may reduce AHI.

## 2023-06-16 NOTE — Assessment & Plan Note (Signed)
 See obesity treatment plan

## 2023-06-28 ENCOUNTER — Telehealth (INDEPENDENT_AMBULATORY_CARE_PROVIDER_SITE_OTHER): Payer: Self-pay | Admitting: Internal Medicine

## 2023-06-28 NOTE — Telephone Encounter (Signed)
Patient called requesting a 90 day rx for  topiramate (TOPAMAX) 25 MG tablet. Patient states that her insurance will only cover a 90 day supply. [facility].

## 2023-06-29 ENCOUNTER — Other Ambulatory Visit (INDEPENDENT_AMBULATORY_CARE_PROVIDER_SITE_OTHER): Payer: Self-pay

## 2023-06-29 DIAGNOSIS — E66813 Obesity, class 3: Secondary | ICD-10-CM

## 2023-06-29 DIAGNOSIS — F411 Generalized anxiety disorder: Secondary | ICD-10-CM | POA: Diagnosis not present

## 2023-06-29 DIAGNOSIS — F3132 Bipolar disorder, current episode depressed, moderate: Secondary | ICD-10-CM | POA: Diagnosis not present

## 2023-06-29 MED ORDER — TOPIRAMATE 25 MG PO TABS
25.0000 mg | ORAL_TABLET | Freq: Every day | ORAL | 0 refills | Status: DC
Start: 2023-06-29 — End: 2023-07-18

## 2023-06-29 NOTE — Telephone Encounter (Signed)
Rx sent, pt notified. 

## 2023-06-30 ENCOUNTER — Telehealth (HOSPITAL_COMMUNITY): Payer: Self-pay | Admitting: Psychiatry

## 2023-06-30 NOTE — Telephone Encounter (Signed)
D:  Pt called stating that Dr. Jacqulynn Cadet referred her to virtual MH-IOP.  A:  Oriented pt.  Encouraged her to verify her benefits.  Pt requested CCA to be done on 07-05-23 @ 1pm.  Pt will start MH-IOP on 07-06-23 @ 9 a.m.  R:  Pt receptive.

## 2023-07-04 DIAGNOSIS — G4733 Obstructive sleep apnea (adult) (pediatric): Secondary | ICD-10-CM | POA: Diagnosis not present

## 2023-07-04 DIAGNOSIS — F4323 Adjustment disorder with mixed anxiety and depressed mood: Secondary | ICD-10-CM | POA: Diagnosis not present

## 2023-07-05 ENCOUNTER — Other Ambulatory Visit (HOSPITAL_COMMUNITY): Payer: BC Managed Care – PPO | Attending: Psychiatry | Admitting: Psychiatry

## 2023-07-05 DIAGNOSIS — Z1330 Encounter for screening examination for mental health and behavioral disorders, unspecified: Secondary | ICD-10-CM
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
## 2023-07-06 ENCOUNTER — Other Ambulatory Visit (HOSPITAL_COMMUNITY): Payer: BC Managed Care – PPO

## 2023-07-07 ENCOUNTER — Other Ambulatory Visit (HOSPITAL_COMMUNITY): Payer: BC Managed Care – PPO

## 2023-07-08 ENCOUNTER — Other Ambulatory Visit (HOSPITAL_COMMUNITY): Payer: BC Managed Care – PPO | Admitting: Psychiatry

## 2023-07-08 ENCOUNTER — Encounter (HOSPITAL_COMMUNITY): Payer: Self-pay

## 2023-07-11 ENCOUNTER — Encounter (HOSPITAL_COMMUNITY): Payer: Self-pay

## 2023-07-11 ENCOUNTER — Telehealth (HOSPITAL_COMMUNITY): Payer: Self-pay | Admitting: Psychiatry

## 2023-07-11 ENCOUNTER — Other Ambulatory Visit (HOSPITAL_COMMUNITY): Payer: BC Managed Care – PPO

## 2023-07-11 DIAGNOSIS — I160 Hypertensive urgency: Secondary | ICD-10-CM | POA: Diagnosis not present

## 2023-07-11 DIAGNOSIS — Z0000 Encounter for general adult medical examination without abnormal findings: Secondary | ICD-10-CM | POA: Diagnosis not present

## 2023-07-11 DIAGNOSIS — J309 Allergic rhinitis, unspecified: Secondary | ICD-10-CM | POA: Diagnosis not present

## 2023-07-11 DIAGNOSIS — I10 Essential (primary) hypertension: Secondary | ICD-10-CM | POA: Diagnosis not present

## 2023-07-11 DIAGNOSIS — E785 Hyperlipidemia, unspecified: Secondary | ICD-10-CM | POA: Diagnosis not present

## 2023-07-11 NOTE — Telephone Encounter (Signed)
D:  Pt didn't start as scheduled on 07-06-23; due to not having video access on her phone.  Pt had stated she would pay her cell phone bill on 07-05-23, so she could start but she apparently didn't and didn't call the case manager. Pt sent an email today stating she was ready to start.  Pt returned the case manager's phone call.  A:  Pt will start on 07-19-23 d/t Thanksgiving holiday and PAL case mgr will be on.  R:  Pt receptive with start date.

## 2023-07-12 ENCOUNTER — Other Ambulatory Visit (HOSPITAL_COMMUNITY): Payer: BC Managed Care – PPO

## 2023-07-13 ENCOUNTER — Other Ambulatory Visit (HOSPITAL_COMMUNITY): Payer: BC Managed Care – PPO

## 2023-07-15 ENCOUNTER — Other Ambulatory Visit (HOSPITAL_COMMUNITY): Payer: BC Managed Care – PPO

## 2023-07-18 ENCOUNTER — Encounter (INDEPENDENT_AMBULATORY_CARE_PROVIDER_SITE_OTHER): Payer: Self-pay | Admitting: Internal Medicine

## 2023-07-18 ENCOUNTER — Other Ambulatory Visit (HOSPITAL_COMMUNITY): Payer: BC Managed Care – PPO

## 2023-07-18 ENCOUNTER — Ambulatory Visit (INDEPENDENT_AMBULATORY_CARE_PROVIDER_SITE_OTHER): Payer: BC Managed Care – PPO | Admitting: Internal Medicine

## 2023-07-18 VITALS — BP 136/86 | HR 78 | Temp 98.7°F | Ht 66.0 in | Wt 246.0 lb

## 2023-07-18 DIAGNOSIS — F1090 Alcohol use, unspecified, uncomplicated: Secondary | ICD-10-CM | POA: Diagnosis not present

## 2023-07-18 DIAGNOSIS — E66813 Obesity, class 3: Secondary | ICD-10-CM | POA: Diagnosis not present

## 2023-07-18 DIAGNOSIS — Z789 Other specified health status: Secondary | ICD-10-CM

## 2023-07-18 DIAGNOSIS — Z6841 Body Mass Index (BMI) 40.0 and over, adult: Secondary | ICD-10-CM

## 2023-07-18 DIAGNOSIS — Z3009 Encounter for other general counseling and advice on contraception: Secondary | ICD-10-CM

## 2023-07-18 MED ORDER — TOPIRAMATE 25 MG PO TABS: 25.0000 mg | ORAL_TABLET | Freq: Every day | ORAL | 1 refills | Status: DC

## 2023-07-18 NOTE — Progress Notes (Signed)
Office: [phone]  /  Fax: [phone]  Weight Summary And Biometrics  Vitals Temp: 98.7 F (37.1 C) BP: 136/86 Pulse Rate: 78 SpO2: 98 %   Anthropometric Measurements Height: 5\' 6"  (1.676 m) Weight: 246 lb (111.6 kg) BMI (Calculated): 39.72 Weight at Last Visit: 251 lb Weight Lost Since Last Visit: 5 lb Weight Gained Since Last Visit: 0 lb Starting Weight: 264 lb Total Weight Loss (lbs): 18 lb (8.165 kg) Peak Weight: 275 lb   Body Composition  Body Fat %: 43.2 % Fat Mass (lbs): 106.6 lbs Muscle Mass (lbs): 133 lbs Total Body Water (lbs): 88.2 lbs Visceral Fat Rating : 11    No data recorded Today's Visit #: 6  Starting Date: 10/27/22   Subjective   Chief Complaint: Obesity  Tracy Grant is here to discuss her progress with her obesity treatment plan. She is on the the Category 3 Plan and states she is following her eating plan approximately 25 % of the time. She states she is not exercising.  Interval History:   Discussed the use of AI scribe software for clinical note transcription with the patient, who gave verbal consent to proceed.  History of Present Illness   The patient, diagnosed with obesity, presents for a weight management consultation. She reports a recent weight loss of five pounds, which she attributes to portion control and reducing the amount of food on her plate. Despite this progress, she has been unable to fill her prescription for topiramate, a medication previously prescribed for weight management, due to insurance issues. She has been off the medication for over a month and has noticed an increase in appetite and alcohol consumption during this period.  The patient also reports a reluctance to exercise due to a previous incident involving a dog while walking in her neighborhood. She expresses interest in home-based exercise routines, such as those available on YouTube, but has not yet started any.  In addition to her weight management  concerns, the patient is considering changing her birth control method from an IUD to the NuvaRing. She expresses concerns about recurrent bacterial vaginosis diagnoses and believes the IUD may be contributing to this issue. She plans to discuss this change with her gynecologist, taking into account the potential interaction with her weight management medication.    Barriers identified: strong hunger signals and impaired satiety / inhibitory control and low volume of physical activity at present .   Pharmacotherapy for weight loss: She is currently taking Topiramate (off label use, single agent) with adequate clinical response  and without side effects..   Assessment and Plan   Treatment Plan For Obesity:  Recommended Dietary Goals  [name] is currently in the action stage of change. As such, her goal is to continue weight management plan. She has agreed to: continue current plan  Behavioral Intervention  We discussed the following Behavioral Modification Strategies today: continue to work on maintaining a reduced calorie state, getting the recommended amount of protein, incorporating whole foods, making healthy choices, staying well hydrated and practicing mindfulness when eating..  Additional resources provided today: None  Recommended Physical Activity Goals  Dina has been advised to work up to 150 minutes of moderate intensity aerobic activity a week and strengthening exercises 2-3 times per week for cardiovascular health, weight loss maintenance and preservation of muscle mass.   She has agreed to :  Think about enjoyable ways to increase daily physical activity and overcoming barriers to exercise and Increase physical activity in their day and  reduce sedentary time (increase NEAT).  She will look into YouTube videos to do strengthening exercises.  Pharmacotherapy  We discussed various medication options to help [name] with her weight loss efforts and we both agreed to :  continue current anti-obesity medication regimen  Associated Conditions Addressed Today  Alcohol use Assessment & Plan: Patient had noted an improvement on topiramate.  Unfortunately she has been having problems obtaining medication via insurance.  Medication was refilled today.  She may consider paying for medication out-of-pocket.   Class 3 severe obesity with serious comorbidity and body mass index (BMI) of 40.0 to 44.9 in adult, unspecified obesity type [facility]) Assessment & Plan:     She has made progress in weight management, losing 5 pounds over the holidays through portion control, bringing her BMI down to 39 from 47. Since March, she has lost a total of 24 pounds, reducing her weight from 275 to 246 pounds. She has been off topiramate for over a month due to insurance issues, which has led to an increased appetite and alcohol consumption. Her exercise is limited by a fear of walking in the neighborhood and financial constraints preventing gym membership. We discussed long-term weight management strategies, including maintaining portion control, not skipping meals, increasing protein intake, and consuming more fruits and vegetables. We encouraged her to use YouTube for exercise videos such as yoga, Pilates, and HIIT. We will send a topiramate prescription to [facility] with one refill and advise on the importance of not skipping meals, increasing protein intake, and consuming more fruits and vegetables. A follow-up is scheduled in four weeks.            Orders: -     Topiramate; Take 1 tablet (25 mg total) by mouth daily.  Dispense: 30 tablet; Refill: 1  Family planning Assessment & Plan: She currently has an IUD and plans to switch to another form of birth control due to recurrent bacterial vaginosis and the IUD being overdue for replacement, considering the NuvaRing. We discussed the need for a second method of contraception if switching from the IUD due to topiramate's  teratogenic effects and advised her to discuss birth control options with her gynecologist to ensure compatibility with topiramate. She should ensure the use of a second contraceptive method if switching to NuvaRing while on topiramate.      Objective   Physical Exam:  Blood pressure 136/86, pulse 78, temperature 98.7 F (37.1 C), height 5\' 6"  (1.676 m), weight 246 lb (111.6 kg), SpO2 98%. Body mass index is 39.71 kg/m.  General: She is overweight, cooperative, alert, well developed, and in no acute distress. PSYCH: Has normal mood, affect and thought process.   HEENT: EOMI, sclerae are anicteric. Lungs: Normal breathing effort, no conversational dyspnea. Extremities: No edema.  Neurologic: No gross sensory or motor deficits. No tremors or fasciculations noted.    Diagnostic Data Reviewed:  BMET    Component Value Date/Time   NA 135 10/27/2022 1000   K 4.2 10/27/2022 1000   CL 98 10/27/2022 1000   CO2 23 10/27/2022 1000   GLUCOSE 101 (H) 10/27/2022 1000   GLUCOSE 99 05/03/2021 0357   BUN 7 10/27/2022 1000   CREATININE 1.02 (H) 10/27/2022 1000   CREATININE 0.78 08/27/2015 1221   CALCIUM 9.8 10/27/2022 1000   GFRNONAA >60 05/03/2021 0357   GFRNONAA >89 08/27/2015 1221   GFRAA >60 03/24/2017 0258   GFRAA >89 08/27/2015 1221   Lab Results  Component Value Date   HGBA1C 4.9  10/27/2022   HGBA1C 5.1 08/08/2012   Lab Results  Component Value Date   INSULIN 13.8 10/27/2022   Lab Results  Component Value Date   TSH 1.160 10/27/2022   CBC    Component Value Date/Time   WBC 3.8 10/27/2022 1000   WBC 4.7 05/03/2021 0357   RBC 4.06 10/27/2022 1000   RBC 4.21 05/03/2021 0357   HGB 13.5 10/27/2022 1000   HCT 40.4 10/27/2022 1000   PLT 282 10/27/2022 1000   MCV 100 (H) 10/27/2022 1000   MCH 33.3 (H) 10/27/2022 1000   MCH 33.7 05/03/2021 0357   MCHC 33.4 10/27/2022 1000   MCHC 35.0 05/03/2021 0357   RDW 13.1 10/27/2022 1000   Iron Studies No results found for:  "IRON", "TIBC", "FERRITIN", "IRONPCTSAT" Lipid Panel     Component Value Date/Time   CHOL 204 (H) 10/27/2022 1000   TRIG 190 (H) 10/27/2022 1000   HDL 45 10/27/2022 1000   CHOLHDL 4 08/08/2012 0903   VLDL 39.6 08/08/2012 0903   LDLCALC 125 (H) 10/27/2022 1000   Hepatic Function Panel     Component Value Date/Time   PROT 7.3 10/27/2022 1000   ALBUMIN 4.6 10/27/2022 1000   AST 22 10/27/2022 1000   ALT 20 10/27/2022 1000   ALKPHOS 59 10/27/2022 1000   BILITOT 0.6 10/27/2022 1000      Component Value Date/Time   TSH 1.160 10/27/2022 1000   Nutritional Lab Results  Component Value Date   VD25OH 13.1 (L) 10/27/2022   VD25OH 22 (L) 06/15/2013    Follow-Up   Return in about 4 weeks (around 08/15/2023) for For Weight Mangement with Dr. Rikki Spearing.Marland Kitchen She was informed of the importance of frequent follow up visits to maximize her success with intensive lifestyle modifications for her multiple health conditions.  Attestation Statement   Reviewed by clinician on day of visit: allergies, medications, problem list, medical history, surgical history, family history, social history, and previous encounter notes.     Worthy Rancher, MD

## 2023-07-18 NOTE — Assessment & Plan Note (Signed)
     She has made progress in weight management, losing 5 pounds over the holidays through portion control, bringing her BMI down to 39 from 47. Since March, she has lost a total of 24 pounds, reducing her weight from 275 to 246 pounds. She has been off topiramate for over a month due to insurance issues, which has led to an increased appetite and alcohol consumption. Her exercise is limited by a fear of walking in the neighborhood and financial constraints preventing gym membership. We discussed long-term weight management strategies, including maintaining portion control, not skipping meals, increasing protein intake, and consuming more fruits and vegetables. We encouraged her to use YouTube for exercise videos such as yoga, Pilates, and HIIT. We will send a topiramate prescription to [facility] with one refill and advise on the importance of not skipping meals, increasing protein intake, and consuming more fruits and vegetables. A follow-up is scheduled in four weeks.

## 2023-07-18 NOTE — Assessment & Plan Note (Signed)
She currently has an IUD and plans to switch to another form of birth control due to recurrent bacterial vaginosis and the IUD being overdue for replacement, considering the NuvaRing. We discussed the need for a second method of contraception if switching from the IUD due to topiramate's teratogenic effects and advised her to discuss birth control options with her gynecologist to ensure compatibility with topiramate. She should ensure the use of a second contraceptive method if switching to NuvaRing while on topiramate.

## 2023-07-18 NOTE — Assessment & Plan Note (Signed)
Patient had noted an improvement on topiramate.  Unfortunately she has been having problems obtaining medication via insurance.  Medication was refilled today.  She may consider paying for medication out-of-pocket.

## 2023-07-19 ENCOUNTER — Telehealth (HOSPITAL_COMMUNITY): Payer: Self-pay | Admitting: Psychiatry

## 2023-07-19 ENCOUNTER — Other Ambulatory Visit (HOSPITAL_COMMUNITY): Payer: BC Managed Care – PPO

## 2023-07-19 NOTE — Telephone Encounter (Signed)
D:  Pt arrived online for group this morning but she was driving.  Strongly advised pt to log off and case mgr would call her.  Reports on Tuesdays thru Saturdays she has to pick boyfriend up from his job.  "I am almost 20 minutes from home."   Reiterated to pt that it's a liability to be driving and in group; plus it's a HIPAA Violation since boyfriend would be in the car.  "I had my headset on though."  Pointed out to patient all the risks of being in group while operating a car or any machinery. Inquired if there was anyone else who could pick him up.  Pt stated there was no one. A:  Provided pt with support.  Will discuss with regular group facilitator upon his return on 07-25-23.  Will reach back out to pt with an answer or another resource ([place]).  Another facility with a later start time.  Inform treatment team.  R:  Pt receptive.

## 2023-07-20 ENCOUNTER — Other Ambulatory Visit (HOSPITAL_COMMUNITY): Payer: BC Managed Care – PPO

## 2023-07-21 ENCOUNTER — Ambulatory Visit (HOSPITAL_COMMUNITY): Payer: BC Managed Care – PPO

## 2023-07-21 ENCOUNTER — Other Ambulatory Visit (HOSPITAL_COMMUNITY): Payer: BC Managed Care – PPO

## 2023-07-22 ENCOUNTER — Other Ambulatory Visit (HOSPITAL_COMMUNITY): Payer: BC Managed Care – PPO

## 2023-07-22 ENCOUNTER — Ambulatory Visit (HOSPITAL_COMMUNITY): Payer: BC Managed Care – PPO

## 2023-07-25 ENCOUNTER — Telehealth (HOSPITAL_COMMUNITY): Payer: Self-pay | Admitting: Psychiatry

## 2023-07-25 ENCOUNTER — Other Ambulatory Visit (HOSPITAL_COMMUNITY): Payer: BC Managed Care – PPO

## 2023-07-25 ENCOUNTER — Ambulatory Visit (HOSPITAL_COMMUNITY): Payer: BC Managed Care – PPO

## 2023-07-25 DIAGNOSIS — F4323 Adjustment disorder with mixed anxiety and depressed mood: Secondary | ICD-10-CM | POA: Diagnosis not present

## 2023-07-25 NOTE — Telephone Encounter (Signed)
D:  Case manager met with [facility] group facilitator Noralee Stain, [place]) to discuss patient participating in [facility] or not.  Unfortunately, pt states d/t having to pick boyfriend up from work Tues-Fri; she can't log into group by 9:15 a.m.; states it would be around 10 before she would arrive back home.  A:  Team decided pt can't attend [facility] d/t schedule conflict.  Contacted pt to discuss but there was no answer.  Left vm with various resource options with phone #'s for IOP (ie. [facility], [facility] and [facility]).  Case mgr left her phone # just in case pt has questions/concerns.

## 2023-07-26 ENCOUNTER — Other Ambulatory Visit (HOSPITAL_COMMUNITY): Payer: BC Managed Care – PPO

## 2023-07-27 ENCOUNTER — Ambulatory Visit (HOSPITAL_COMMUNITY): Payer: BC Managed Care – PPO

## 2023-07-27 ENCOUNTER — Other Ambulatory Visit (HOSPITAL_COMMUNITY): Payer: BC Managed Care – PPO

## 2023-07-27 DIAGNOSIS — F3174 Bipolar disorder, in full remission, most recent episode manic: Secondary | ICD-10-CM | POA: Diagnosis not present

## 2023-07-27 DIAGNOSIS — F3175 Bipolar disorder, in partial remission, most recent episode depressed: Secondary | ICD-10-CM | POA: Diagnosis not present

## 2023-07-27 DIAGNOSIS — F411 Generalized anxiety disorder: Secondary | ICD-10-CM | POA: Diagnosis not present

## 2023-07-28 ENCOUNTER — Other Ambulatory Visit (HOSPITAL_COMMUNITY): Payer: BC Managed Care – PPO

## 2023-07-28 ENCOUNTER — Ambulatory Visit (HOSPITAL_COMMUNITY): Payer: BC Managed Care – PPO

## 2023-07-29 ENCOUNTER — Other Ambulatory Visit (HOSPITAL_COMMUNITY): Payer: BC Managed Care – PPO

## 2023-07-29 ENCOUNTER — Ambulatory Visit (HOSPITAL_COMMUNITY): Payer: BC Managed Care – PPO

## 2023-08-01 ENCOUNTER — Ambulatory Visit (HOSPITAL_COMMUNITY): Payer: BC Managed Care – PPO

## 2023-08-01 ENCOUNTER — Other Ambulatory Visit (HOSPITAL_COMMUNITY): Payer: BC Managed Care – PPO

## 2023-08-02 ENCOUNTER — Other Ambulatory Visit (HOSPITAL_COMMUNITY): Payer: BC Managed Care – PPO

## 2023-08-02 ENCOUNTER — Ambulatory Visit (HOSPITAL_COMMUNITY): Payer: BC Managed Care – PPO

## 2023-08-02 DIAGNOSIS — G4733 Obstructive sleep apnea (adult) (pediatric): Secondary | ICD-10-CM | POA: Diagnosis not present

## 2023-08-03 ENCOUNTER — Other Ambulatory Visit (HOSPITAL_COMMUNITY): Payer: BC Managed Care – PPO

## 2023-08-03 ENCOUNTER — Ambulatory Visit (HOSPITAL_COMMUNITY): Payer: BC Managed Care – PPO

## 2023-08-03 DIAGNOSIS — G4733 Obstructive sleep apnea (adult) (pediatric): Secondary | ICD-10-CM | POA: Diagnosis not present

## 2023-08-04 ENCOUNTER — Other Ambulatory Visit (HOSPITAL_COMMUNITY): Payer: BC Managed Care – PPO

## 2023-08-04 ENCOUNTER — Ambulatory Visit (HOSPITAL_COMMUNITY): Payer: BC Managed Care – PPO

## 2023-08-05 ENCOUNTER — Ambulatory Visit (HOSPITAL_COMMUNITY): Payer: BC Managed Care – PPO

## 2023-08-05 ENCOUNTER — Other Ambulatory Visit (HOSPITAL_COMMUNITY): Payer: BC Managed Care – PPO

## 2023-08-08 ENCOUNTER — Ambulatory Visit (HOSPITAL_COMMUNITY): Payer: BC Managed Care – PPO

## 2023-08-09 ENCOUNTER — Ambulatory Visit (HOSPITAL_COMMUNITY): Payer: BC Managed Care – PPO

## 2023-08-11 ENCOUNTER — Ambulatory Visit (HOSPITAL_COMMUNITY): Payer: BC Managed Care – PPO

## 2023-08-12 ENCOUNTER — Ambulatory Visit (HOSPITAL_COMMUNITY): Payer: BC Managed Care – PPO

## 2023-08-15 ENCOUNTER — Ambulatory Visit (HOSPITAL_COMMUNITY): Payer: BC Managed Care – PPO

## 2023-08-16 ENCOUNTER — Ambulatory Visit (HOSPITAL_COMMUNITY): Payer: BC Managed Care – PPO

## 2023-08-18 ENCOUNTER — Ambulatory Visit (HOSPITAL_COMMUNITY): Payer: BC Managed Care – PPO

## 2023-08-19 ENCOUNTER — Ambulatory Visit (HOSPITAL_COMMUNITY): Payer: BC Managed Care – PPO

## 2023-08-22 ENCOUNTER — Ambulatory Visit (HOSPITAL_COMMUNITY): Payer: BC Managed Care – PPO

## 2023-08-23 ENCOUNTER — Ambulatory Visit (INDEPENDENT_AMBULATORY_CARE_PROVIDER_SITE_OTHER): Payer: BC Managed Care – PPO | Admitting: Internal Medicine

## 2023-08-23 ENCOUNTER — Ambulatory Visit (HOSPITAL_COMMUNITY): Payer: BC Managed Care – PPO

## 2023-08-23 ENCOUNTER — Encounter (INDEPENDENT_AMBULATORY_CARE_PROVIDER_SITE_OTHER): Payer: Self-pay | Admitting: Internal Medicine

## 2023-08-23 VITALS — BP 138/84 | HR 76 | Temp 97.7°F | Ht 66.0 in | Wt 244.0 lb

## 2023-08-23 DIAGNOSIS — E66813 Obesity, class 3: Secondary | ICD-10-CM

## 2023-08-23 DIAGNOSIS — R7303 Prediabetes: Secondary | ICD-10-CM | POA: Insufficient documentation

## 2023-08-23 DIAGNOSIS — G4733 Obstructive sleep apnea (adult) (pediatric): Secondary | ICD-10-CM | POA: Diagnosis not present

## 2023-08-23 DIAGNOSIS — R638 Other symptoms and signs concerning food and fluid intake: Secondary | ICD-10-CM

## 2023-08-23 DIAGNOSIS — Z6841 Body Mass Index (BMI) 40.0 and over, adult: Secondary | ICD-10-CM

## 2023-08-23 DIAGNOSIS — E88819 Insulin resistance, unspecified: Secondary | ICD-10-CM

## 2023-08-23 MED ORDER — ZEPBOUND 2.5 MG/0.5ML ~~LOC~~ SOAJ: 2.5000 mg | SUBCUTANEOUS | 0 refills | Status: AC

## 2023-08-23 MED ORDER — TOPIRAMATE 25 MG PO TABS: 25.0000 mg | ORAL_TABLET | Freq: Every day | ORAL | 1 refills | Status: AC

## 2023-08-23 NOTE — Progress Notes (Signed)
 Office: [phone]  /  Fax: [phone]  Weight Summary And Biometrics  Vitals Temp: 97.7 F (36.5 C) BP: 138/84 Pulse Rate: 76 SpO2: 95 %   Anthropometric Measurements Height: 5' 6 (1.676 m) Weight: 244 lb (110.7 kg) BMI (Calculated): 39.4 Weight at Last Visit: 246 lb Weight Lost Since Last Visit: 2 lb Weight Gained Since Last Visit: 0 lb Starting Weight: 264 lb Total Weight Loss (lbs): 20 lb (9.072 kg) Peak Weight: 275 lb   Body Composition  Body Fat %: 45 % Fat Mass (lbs): 109.8 lbs Muscle Mass (lbs): 127.6 lbs Total Body Water (lbs): 90 lbs Visceral Fat Rating : 11    No data recorded Today's Visit #: 7  Starting Date: 10/27/22   Subjective   Chief Complaint: Obesity  Tracy Grant is here to discuss her progress with her obesity treatment plan. She is on the the Category 3 Plan and states she is following her eating plan approximately 25 % of the time. She states she is not exercising.  Interval History:   Discussed the use of AI scribe software for clinical note transcription with the patient, who gave verbal consent to proceed.  History of Present Illness   The patient, with a history of obesity and sleep apnea, presents for a medical weight management consultation. She expresses interest in GLP-1 treatment, specifically Ozempic, as she feels her current weight loss progress is insufficient. Despite losing twenty pounds over the past year, she reports not feeling or seeing the weight loss, particularly in the stomach and back areas. She has been working on portion control and making healthier food choices.  The patient is currently on topiramate , which she reports helps with appetite suppression. She expresses interest in adding another medication to potentially enhance her weight loss progress. She has a history of sleep apnea with an AHI of 11, which is considered mild. She uses a CPAP machine intermittently.  The patient's weight loss has been  gradual, with no weight regain over the past year. She started at 270 pounds in January of the previous year and is currently at 244 pounds. She expresses frustration with her perceived lack of progress, particularly when trying on clothes. Despite this, she acknowledges the importance of maintaining her weight loss and is open to adjusting her medication regimen to further her progress.     Orexigenic Control:  Reports problems with appetite and hunger signals.  Reports problems with satiety and satiation.  Denies problems with eating patterns and portion control.  Denies abnormal cravings. Denies feeling deprived or restricted.   Barriers identified: strong hunger signals and impaired satiety / inhibitory control.  Low volume of physical activity  Pharmacotherapy for weight loss: She is currently taking Topiramate  (off label use, single agent) with adequate clinical response  and without side effects..   Assessment and Plan   Treatment Plan For Obesity:  Recommended Dietary Goals  Tracy Grant is currently in the action stage of change. As such, her goal is to continue weight management plan. She has agreed to: continue current plan  Behavioral Intervention  We discussed the following Behavioral Modification Strategies today: continue to work on maintaining a reduced calorie state, getting the recommended amount of protein, incorporating whole foods, making healthy choices, staying well hydrated and practicing mindfulness when eating..  Additional resources provided today: None  Recommended Physical Activity Goals  Tracy Grant has been advised to work up to 150 minutes of moderate intensity aerobic activity a week and strengthening exercises 2-3 times per week  for cardiovascular health, weight loss maintenance and preservation of muscle mass.   She has agreed to :  Think about enjoyable ways to increase daily physical activity and overcoming barriers to exercise and Increase physical  activity in their day and reduce sedentary time (increase NEAT).  Pharmacotherapy  We discussed various medication options to help Tracy Grant with her weight loss efforts and we both agreed to : start anti-obesity medication.  In addition to reduced calorie nutrition plan (RCNP), behavioral strategies and physical activity, Tracy Grant would benefit from pharmacotherapy to assist with hunger signals, satiety and cravings. This will reduce obesity-related health risks by inducing weight loss, and help reduce food consumption and adherence to [facility]) . It may also improve QOL by improving self-confidence and reduce the  setbacks associated with metabolic adaptations.  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  After discussion of treatment options, mechanisms of action, benefits, side effects, contraindications and shared decision making she is agreeable to starting Zepbound  2.5 mg once a week. Patient also made aware that medication is indicated for long-term management of obesity and the risk of weight regain following discontinuation of treatment and hence the importance of adhering to medical weight loss plan.  We demonstrated use of device and patient using teach back method was able to demonstrate proper technique.  Associated Conditions Addressed and Impacted by Obesity Treatment  Class 3 severe obesity with serious comorbidity and body mass index (BMI) of 40.0 to 44.9 in adult, unspecified obesity type Tracy Grant) Assessment & Plan: Tracy Grant has lost approximately 26 pounds since March of last year she is somewhat frustrated by the rate of weight loss which has been gradual.  Current physical activity levels were low we discussed increasing physical activity.  She is currently on topiramate  for appetite control but reports having problems with satiety and  hunger.  She inquires about GLP-1 therapy which I think she would benefit from considering multiple obesity related comorbidities.  After discussion of benefits and side effects she is agreeable to starting Zepbound  2.5 mg once a week if covered by her insurance.            Orders: -     Topiramate ; Take 1 tablet (25 mg total) by mouth daily.  Dispense: 30 tablet; Refill: 1 -     Zepbound ; Inject 2.5 mg into the skin once a week.  Dispense: 2 mL; Refill: 0  OSA on CPAP Assessment & Plan: Reviewed sleep study she has an AHI of 11.  Use of CPAP is variable.  She has been counseled on the risk associated with untreated sleep apnea.  She has resumed treatment.  Losing 15% of body weight may reduce AHI.  Thus far she has lost 10%.  Orders: -     Zepbound ; Inject 2.5 mg into the skin once a week.  Dispense: 2 mL; Refill: 0  Insulin  resistance Assessment & Plan: Her HOMA-IR is 3.44 which is elevated. Optimal level < 1.9. This is complex condition associated with genetics, ectopic fat and lifestyle factors. Insulin  resistance may result in weight gain, abnormal cravings (particularly for carbs) and fatigue. This may result in additional weight gain and lead to pre-diabetes and diabetes if untreated.   Lab Results  Component Value Date   HGBA1C 4.9 10/27/2022   Lab Results  Component Value Date   INSULIN  13.8 10/27/2022   Lab Results  Component Value Date  GLUCOSE 101 (H) 10/27/2022   GLUCOSE 107 (H) 07/28/2009        Prediabetes Assessment & Plan: She has prediabetes based on fasting blood glucose criteria with hyperinsulinemia.  She has been counseled on the carb insulin  model of obesity and is aware of the benefits of maintaining a diet with a low glycemic load.  She will also benefit from pharmacoprophylaxis with GLP-1 therapy.  We will prescribe Zepbound  2.5 mg once a week   Abnormal food appetite Assessment & Plan: She has increased orexigenic signaling, impaired  satiety and inhibitory control. This is secondary to an abnormal energy regulation system and pathological neurohormonal pathways characteristic of excess adiposity.  In addition to nutritional and behavioral strategies she benefits from pharmacotherapy.  After discussion of benefits and side effect she will be prescribed Zepbound  2.5 mg once a week.  She will continue on topiramate  25 mg once in the evening.  She has an IUD.  She has been counseled on the risk of teratogenicity.  If her insurance cover Zepbound  we will discontinue topiramate .       Objective   Physical Exam:  Blood pressure 138/84, pulse 76, temperature 97.7 F (36.5 C), height 5' 6 (1.676 m), weight 244 lb (110.7 kg), last menstrual period 08/05/2023, SpO2 95%. Body mass index is 39.38 kg/m.  General: She is overweight, cooperative, alert, well developed, and in no acute distress. PSYCH: Has normal mood, affect and thought process.   HEENT: EOMI, sclerae are anicteric. Lungs: Normal breathing effort, no conversational dyspnea. Extremities: No edema.  Neurologic: No gross sensory or motor deficits. No tremors or fasciculations noted.    Diagnostic Data Reviewed:  BMET    Component Value Date/Time   NA 135 10/27/2022 1000   K 4.2 10/27/2022 1000   CL 98 10/27/2022 1000   CO2 23 10/27/2022 1000   GLUCOSE 101 (H) 10/27/2022 1000   GLUCOSE 99 05/03/2021 0357   BUN 7 10/27/2022 1000   CREATININE 1.02 (H) 10/27/2022 1000   CREATININE 0.78 08/27/2015 1221   CALCIUM 9.8 10/27/2022 1000   GFRNONAA >60 05/03/2021 0357   GFRNONAA >89 08/27/2015 1221   GFRAA >60 03/24/2017 0258   GFRAA >89 08/27/2015 1221   Lab Results  Component Value Date   HGBA1C 4.9 10/27/2022   HGBA1C 5.1 08/08/2012   Lab Results  Component Value Date   INSULIN  13.8 10/27/2022   Lab Results  Component Value Date   TSH 1.160 10/27/2022   CBC    Component Value Date/Time   WBC 3.8 10/27/2022 1000   WBC 4.7 05/03/2021 0357   RBC  4.06 10/27/2022 1000   RBC 4.21 05/03/2021 0357   HGB 13.5 10/27/2022 1000   HCT 40.4 10/27/2022 1000   PLT 282 10/27/2022 1000   MCV 100 (H) 10/27/2022 1000   MCH 33.3 (H) 10/27/2022 1000   MCH 33.7 05/03/2021 0357   MCHC 33.4 10/27/2022 1000   MCHC 35.0 05/03/2021 0357   RDW 13.1 10/27/2022 1000   Iron Studies No results found for: IRON, TIBC, FERRITIN, IRONPCTSAT Lipid Panel     Component Value Date/Time   CHOL 204 (H) 10/27/2022 1000   TRIG 190 (H) 10/27/2022 1000   HDL 45 10/27/2022 1000   CHOLHDL 4 08/08/2012 0903   VLDL 39.6 08/08/2012 0903   LDLCALC 125 (H) 10/27/2022 1000   Hepatic Function Panel     Component Value Date/Time   PROT 7.3 10/27/2022 1000   ALBUMIN 4.6 10/27/2022 1000   AST 22  10/27/2022 1000   ALT 20 10/27/2022 1000   ALKPHOS 59 10/27/2022 1000   BILITOT 0.6 10/27/2022 1000      Component Value Date/Time   TSH 1.160 10/27/2022 1000   Nutritional Lab Results  Component Value Date   VD25OH 13.1 (L) 10/27/2022   VD25OH 22 (L) 06/15/2013    Follow-Up   Return in about 3 weeks (around 09/13/2023) for For Weight Mangement with Dr. Francyne.SABRA She was informed of the importance of frequent follow up visits to maximize her success with intensive lifestyle modifications for her multiple health conditions.  Attestation Statement   Reviewed by clinician on day of visit: allergies, medications, problem list, medical history, surgical history, family history, social history, and previous encounter notes.     Lucas Francyne, MD

## 2023-08-23 NOTE — Assessment & Plan Note (Signed)
 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 Optimal level < 1.9. This is complex condition associated with genetics, ectopic fat and lifestyle factors. Insulin  resistance may result in weight gain, abnormal cravings (particularly for carbs) and fatigue. This may result in additional weight gain and lead to pre-diabetes and diabetes if untreated.   Lab Results  Component Value Date   HGBA1C 4.9 10/27/2022   Lab Results  Component Value Date   INSULIN  13.8 10/27/2022   Lab Results  Component Value Date   GLUCOSE 101 (H) 10/27/2022   GLUCOSE 107 (H) 07/28/2009

## 2023-08-23 NOTE — Assessment & Plan Note (Signed)
 Hart has lost approximately 26 pounds since March of last year she is somewhat frustrated by the rate of weight loss which has been gradual.  Current physical activity levels were low we discussed increasing physical activity.  She is currently on topiramate  for appetite control but reports having problems with satiety and hunger.  She inquires about GLP-1 therapy which I think she would benefit from considering multiple obesity related comorbidities.  After discussion of benefits and side effects she is agreeable to starting Zepbound  2.5 mg once a week if covered by her insurance.

## 2023-08-23 NOTE — Assessment & Plan Note (Addendum)
 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  In addition to nutritional and behavioral strategies she benefits from pharmacotherapy.  After discussion of benefits and side effect she will be prescribed Zepbound  2.5 mg once a week.  She will continue on topiramate  25 mg once in the evening.  She has an IUD.  She has been counseled on the risk of teratogenicity.  If her insurance cover Zepbound  we will discontinue topiramate .

## 2023-08-23 NOTE — Assessment & Plan Note (Signed)
 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  She has been counseled on the carb insulin  model of obesity and is aware of the benefits of maintaining a diet with a low glycemic load.  She will also benefit from pharmacoprophylaxis with GLP-1 therapy.  We will prescribe Zepbound  2.5 mg once a week

## 2023-08-23 NOTE — Assessment & Plan Note (Signed)
 Reviewed sleep study she has an AHI of 11.  Use of CPAP is variable.  She has been counseled on the risk associated with untreated sleep apnea.  She has resumed treatment.  Losing 15% of body weight may reduce AHI.  Thus far she has lost 10%.

## 2023-08-24 ENCOUNTER — Other Ambulatory Visit (INDEPENDENT_AMBULATORY_CARE_PROVIDER_SITE_OTHER): Payer: Self-pay | Admitting: Internal Medicine

## 2023-08-24 ENCOUNTER — Ambulatory Visit (HOSPITAL_COMMUNITY): Payer: BC Managed Care – PPO

## 2023-08-25 ENCOUNTER — Ambulatory Visit (HOSPITAL_COMMUNITY): Payer: BC Managed Care – PPO

## 2023-08-25 NOTE — Telephone Encounter (Signed)
 Pt pharmacy doesn't have prescription, needs to be sent somewhere else

## 2023-08-26 ENCOUNTER — Ambulatory Visit (HOSPITAL_COMMUNITY): Payer: BC Managed Care – PPO

## 2023-08-26 NOTE — Telephone Encounter (Signed)
 We are titrating medication and she is of reproductive age. 90 days X 3 is not appropriate at present time.

## 2023-08-29 ENCOUNTER — Ambulatory Visit (HOSPITAL_COMMUNITY): Payer: BC Managed Care – PPO

## 2023-08-29 DIAGNOSIS — F4323 Adjustment disorder with mixed anxiety and depressed mood: Secondary | ICD-10-CM | POA: Diagnosis not present

## 2023-08-30 ENCOUNTER — Ambulatory Visit (HOSPITAL_COMMUNITY): Payer: BC Managed Care – PPO

## 2023-08-31 ENCOUNTER — Ambulatory Visit (HOSPITAL_COMMUNITY): Payer: BC Managed Care – PPO

## 2023-09-01 ENCOUNTER — Ambulatory Visit (HOSPITAL_COMMUNITY): Payer: BC Managed Care – PPO

## 2023-09-02 ENCOUNTER — Ambulatory Visit (HOSPITAL_COMMUNITY): Payer: BC Managed Care – PPO

## 2023-09-02 DIAGNOSIS — F3175 Bipolar disorder, in partial remission, most recent episode depressed: Secondary | ICD-10-CM | POA: Diagnosis not present

## 2023-09-02 DIAGNOSIS — G4733 Obstructive sleep apnea (adult) (pediatric): Secondary | ICD-10-CM | POA: Diagnosis not present

## 2023-09-02 DIAGNOSIS — F411 Generalized anxiety disorder: Secondary | ICD-10-CM | POA: Diagnosis not present

## 2023-09-02 DIAGNOSIS — F3174 Bipolar disorder, in full remission, most recent episode manic: Secondary | ICD-10-CM | POA: Diagnosis not present

## 2023-09-20 ENCOUNTER — Encounter (INDEPENDENT_AMBULATORY_CARE_PROVIDER_SITE_OTHER): Payer: Self-pay

## 2023-09-21 ENCOUNTER — Ambulatory Visit (INDEPENDENT_AMBULATORY_CARE_PROVIDER_SITE_OTHER): Payer: BC Managed Care – PPO | Admitting: Family Medicine

## 2023-09-22 ENCOUNTER — Ambulatory Visit (INDEPENDENT_AMBULATORY_CARE_PROVIDER_SITE_OTHER): Payer: BC Managed Care – PPO | Admitting: Adult Health

## 2023-09-28 ENCOUNTER — Ambulatory Visit (INDEPENDENT_AMBULATORY_CARE_PROVIDER_SITE_OTHER): Payer: BC Managed Care – PPO | Admitting: Family Medicine

## 2023-10-03 ENCOUNTER — Ambulatory Visit (INDEPENDENT_AMBULATORY_CARE_PROVIDER_SITE_OTHER): Payer: BC Managed Care – PPO | Admitting: Adult Health

## 2023-10-03 DIAGNOSIS — G4733 Obstructive sleep apnea (adult) (pediatric): Secondary | ICD-10-CM | POA: Diagnosis not present

## 2023-10-21 DIAGNOSIS — F3176 Bipolar disorder, in full remission, most recent episode depressed: Secondary | ICD-10-CM | POA: Diagnosis not present

## 2023-10-21 DIAGNOSIS — F411 Generalized anxiety disorder: Secondary | ICD-10-CM | POA: Diagnosis not present

## 2023-10-21 DIAGNOSIS — F3112 Bipolar disorder, current episode manic without psychotic features, moderate: Secondary | ICD-10-CM | POA: Diagnosis not present

## 2023-11-01 DIAGNOSIS — G4733 Obstructive sleep apnea (adult) (pediatric): Secondary | ICD-10-CM | POA: Diagnosis not present

## 2023-11-17 DIAGNOSIS — Z6841 Body Mass Index (BMI) 40.0 and over, adult: Secondary | ICD-10-CM | POA: Diagnosis not present

## 2023-11-17 DIAGNOSIS — G43109 Migraine with aura, not intractable, without status migrainosus: Secondary | ICD-10-CM | POA: Diagnosis not present

## 2023-11-17 DIAGNOSIS — Z113 Encounter for screening for infections with a predominantly sexual mode of transmission: Secondary | ICD-10-CM | POA: Diagnosis not present

## 2023-12-02 DIAGNOSIS — G4733 Obstructive sleep apnea (adult) (pediatric): Secondary | ICD-10-CM | POA: Diagnosis not present

## 2024-01-26 DIAGNOSIS — F411 Generalized anxiety disorder: Secondary | ICD-10-CM | POA: Diagnosis not present

## 2024-01-26 DIAGNOSIS — F3112 Bipolar disorder, current episode manic without psychotic features, moderate: Secondary | ICD-10-CM | POA: Diagnosis not present

## 2024-01-26 DIAGNOSIS — F3176 Bipolar disorder, in full remission, most recent episode depressed: Secondary | ICD-10-CM | POA: Diagnosis not present

## 2024-01-30 DIAGNOSIS — G4733 Obstructive sleep apnea (adult) (pediatric): Secondary | ICD-10-CM | POA: Diagnosis not present

## 2024-02-29 DIAGNOSIS — G4733 Obstructive sleep apnea (adult) (pediatric): Secondary | ICD-10-CM | POA: Diagnosis not present

## 2024-03-31 DIAGNOSIS — G4733 Obstructive sleep apnea (adult) (pediatric): Secondary | ICD-10-CM | POA: Diagnosis not present

## 2024-04-30 DIAGNOSIS — G4733 Obstructive sleep apnea (adult) (pediatric): Secondary | ICD-10-CM | POA: Diagnosis not present

## 2024-05-01 DIAGNOSIS — G4733 Obstructive sleep apnea (adult) (pediatric): Secondary | ICD-10-CM | POA: Diagnosis not present

## 2024-05-30 DIAGNOSIS — G4733 Obstructive sleep apnea (adult) (pediatric): Secondary | ICD-10-CM | POA: Diagnosis not present
